# Patient Record
Sex: Male | Born: 1945 | Race: White | Hispanic: No | Marital: Married | Smoking: Former smoker
Health system: Southern US, Community
[De-identification: ages and names within clinical notes are randomized; demographics above are authoritative.]

## PROBLEM LIST (undated history)

## (undated) DIAGNOSIS — K219 Gastro-esophageal reflux disease without esophagitis: Secondary | ICD-10-CM

## (undated) DIAGNOSIS — I10 Essential (primary) hypertension: Secondary | ICD-10-CM

## (undated) DIAGNOSIS — K8012 Calculus of gallbladder with acute and chronic cholecystitis without obstruction: Secondary | ICD-10-CM

## (undated) DIAGNOSIS — E7849 Other hyperlipidemia: Secondary | ICD-10-CM

## (undated) HISTORY — DX: Calculus of gallbladder with acute and chronic cholecystitis without obstruction: K80.12

---

## 2004-02-18 ENCOUNTER — Emergency Department: Payer: Self-pay | Admitting: Emergency Medicine

## 2005-09-13 ENCOUNTER — Emergency Department: Payer: Self-pay | Admitting: Unknown Physician Specialty

## 2006-10-18 ENCOUNTER — Emergency Department: Payer: Self-pay | Admitting: Unknown Physician Specialty

## 2007-04-19 HISTORY — PX: RIGHT COLECTOMY: SHX853

## 2007-06-27 ENCOUNTER — Emergency Department: Payer: Self-pay | Admitting: Otolaryngology

## 2007-06-30 ENCOUNTER — Emergency Department: Payer: Self-pay | Admitting: Emergency Medicine

## 2007-06-30 ENCOUNTER — Other Ambulatory Visit: Payer: Self-pay

## 2008-04-18 HISTORY — PX: CARDIAC CATHETERIZATION: SHX172

## 2008-06-01 ENCOUNTER — Emergency Department: Payer: Self-pay | Admitting: Emergency Medicine

## 2008-07-10 ENCOUNTER — Ambulatory Visit: Payer: Self-pay | Admitting: Cardiology

## 2008-08-14 ENCOUNTER — Emergency Department: Payer: Self-pay | Admitting: Unknown Physician Specialty

## 2008-09-09 ENCOUNTER — Ambulatory Visit: Payer: Self-pay | Admitting: Gastroenterology

## 2009-12-20 ENCOUNTER — Emergency Department: Payer: Self-pay | Admitting: Unknown Physician Specialty

## 2010-06-12 ENCOUNTER — Emergency Department: Payer: Self-pay | Admitting: Unknown Physician Specialty

## 2010-06-15 ENCOUNTER — Emergency Department: Payer: Self-pay | Admitting: Emergency Medicine

## 2011-11-18 ENCOUNTER — Emergency Department: Payer: Self-pay | Admitting: Emergency Medicine

## 2011-11-18 LAB — COMPREHENSIVE METABOLIC PANEL
Albumin: 4.2 g/dL (ref 3.4–5.0)
Anion Gap: 10 (ref 7–16)
BUN: 16 mg/dL (ref 7–18)
Calcium, Total: 9.4 mg/dL (ref 8.5–10.1)
Chloride: 104 mmol/L (ref 98–107)
Co2: 29 mmol/L (ref 21–32)
EGFR (African American): >60
EGFR (Non-African Amer.): >60
Glucose: 138 mg/dL — ABNORMAL HIGH (ref 65–99)
Osmolality: 288 (ref 275–301)
Potassium: 3.9 mmol/L (ref 3.5–5.1)
SGOT(AST): 31 U/L (ref 15–37)
SGPT (ALT): 39 U/L

## 2011-11-18 LAB — CBC
MCH: 32.2 pg (ref 26.0–34.0)
MCHC: 35 g/dL (ref 32.0–36.0)
MCV: 92 fL (ref 80–100)
Platelet: 196 10*3/uL (ref 150–440)
RBC: 4.8 10*6/uL (ref 4.40–5.90)
RDW: 12.9 % (ref 11.5–14.5)

## 2017-10-27 ENCOUNTER — Inpatient Hospital Stay
Admission: EM | Admit: 2017-10-27 | Discharge: 2017-11-16 | DRG: 418 | Disposition: A | Discharge status: Home / Self Care | Payer: Medicare Other | Attending: Internal Medicine | Admitting: Internal Medicine

## 2017-10-27 ENCOUNTER — Emergency Department: Payer: Medicare Other

## 2017-10-27 ENCOUNTER — Encounter: Payer: Self-pay | Admitting: Emergency Medicine

## 2017-10-27 ENCOUNTER — Other Ambulatory Visit: Payer: Self-pay

## 2017-10-27 DIAGNOSIS — Z833 Family history of diabetes mellitus: Secondary | ICD-10-CM

## 2017-10-27 DIAGNOSIS — K219 Gastro-esophageal reflux disease without esophagitis: Secondary | ICD-10-CM | POA: Diagnosis present

## 2017-10-27 DIAGNOSIS — J45909 Unspecified asthma, uncomplicated: Secondary | ICD-10-CM | POA: Diagnosis present

## 2017-10-27 DIAGNOSIS — R338 Other retention of urine: Secondary | ICD-10-CM | POA: Diagnosis not present

## 2017-10-27 DIAGNOSIS — I200 Unstable angina: Secondary | ICD-10-CM | POA: Diagnosis present

## 2017-10-27 DIAGNOSIS — K567 Ileus, unspecified: Secondary | ICD-10-CM | POA: Diagnosis not present

## 2017-10-27 DIAGNOSIS — K812 Acute cholecystitis with chronic cholecystitis: Secondary | ICD-10-CM | POA: Diagnosis present

## 2017-10-27 DIAGNOSIS — Z7982 Long term (current) use of aspirin: Secondary | ICD-10-CM

## 2017-10-27 DIAGNOSIS — I10 Essential (primary) hypertension: Secondary | ICD-10-CM | POA: Diagnosis present

## 2017-10-27 DIAGNOSIS — E7849 Other hyperlipidemia: Secondary | ICD-10-CM | POA: Diagnosis not present

## 2017-10-27 DIAGNOSIS — K821 Hydrops of gallbladder: Secondary | ICD-10-CM | POA: Diagnosis present

## 2017-10-27 DIAGNOSIS — R140 Abdominal distension (gaseous): Secondary | ICD-10-CM

## 2017-10-27 DIAGNOSIS — K660 Peritoneal adhesions (postprocedural) (postinfection): Secondary | ICD-10-CM | POA: Diagnosis present

## 2017-10-27 DIAGNOSIS — R109 Unspecified abdominal pain: Secondary | ICD-10-CM

## 2017-10-27 DIAGNOSIS — K9189 Other postprocedural complications and disorders of digestive system: Secondary | ICD-10-CM | POA: Diagnosis present

## 2017-10-27 DIAGNOSIS — Z8249 Family history of ischemic heart disease and other diseases of the circulatory system: Secondary | ICD-10-CM | POA: Diagnosis not present

## 2017-10-27 DIAGNOSIS — E785 Hyperlipidemia, unspecified: Secondary | ICD-10-CM | POA: Diagnosis present

## 2017-10-27 DIAGNOSIS — K56609 Unspecified intestinal obstruction, unspecified as to partial versus complete obstruction: Secondary | ICD-10-CM

## 2017-10-27 DIAGNOSIS — Z885 Allergy status to narcotic agent status: Secondary | ICD-10-CM

## 2017-10-27 DIAGNOSIS — K8012 Calculus of gallbladder with acute and chronic cholecystitis without obstruction: Secondary | ICD-10-CM

## 2017-10-27 DIAGNOSIS — K8011 Calculus of gallbladder with chronic cholecystitis with obstruction: Secondary | ICD-10-CM | POA: Diagnosis not present

## 2017-10-27 DIAGNOSIS — Z720 Tobacco use: Secondary | ICD-10-CM | POA: Diagnosis not present

## 2017-10-27 DIAGNOSIS — R079 Chest pain, unspecified: Secondary | ICD-10-CM

## 2017-10-27 HISTORY — DX: Gastro-esophageal reflux disease without esophagitis: K21.9

## 2017-10-27 HISTORY — DX: Essential (primary) hypertension: I10

## 2017-10-27 HISTORY — DX: Other hyperlipidemia: E78.49

## 2017-10-27 LAB — BASIC METABOLIC PANEL
Anion gap: 9 (ref 5–15)
BUN: 10 mg/dL (ref 8–23)
CHLORIDE: 99 mmol/L (ref 98–111)
CO2: 24 mmol/L (ref 22–32)
Calcium: 8.9 mg/dL (ref 8.9–10.3)
Creatinine, Ser: 0.81 mg/dL (ref 0.61–1.24)
GFR calc Af Amer: >60 mL/min (ref >60)
GFR calc non Af Amer: >60 mL/min (ref >60)
Glucose, Bld: 159 mg/dL — ABNORMAL HIGH (ref 70–99)
POTASSIUM: 3.6 mmol/L (ref 3.5–5.1)
SODIUM: 132 mmol/L — AB (ref 135–145)

## 2017-10-27 LAB — CBC
HEMATOCRIT: 37.1 % — AB (ref 40.0–52.0)
Hemoglobin: 12.9 g/dL — ABNORMAL LOW (ref 13.0–18.0)
MCH: 32.4 pg (ref 26.0–34.0)
MCHC: 34.7 g/dL (ref 32.0–36.0)
MCV: 93.3 fL (ref 80.0–100.0)
PLATELETS: 184 10*3/uL (ref 150–440)
RBC: 3.97 MIL/uL — ABNORMAL LOW (ref 4.40–5.90)
RDW: 13.8 % (ref 11.5–14.5)
WBC: 9.8 10*3/uL (ref 3.8–10.6)

## 2017-10-27 LAB — HEPATIC FUNCTION PANEL
ALBUMIN: 3.7 g/dL (ref 3.5–5.0)
ALK PHOS: 127 U/L — AB (ref 38–126)
ALT: 41 U/L (ref 0–44)
AST: 36 U/L (ref 15–41)
BILIRUBIN INDIRECT: 0.7 mg/dL (ref 0.3–0.9)
Bilirubin, Direct: 0.3 mg/dL — ABNORMAL HIGH (ref 0.0–0.2)
TOTAL PROTEIN: 7 g/dL (ref 6.5–8.1)
Total Bilirubin: 1 mg/dL (ref 0.3–1.2)

## 2017-10-27 LAB — PROTIME-INR
INR: 1.19
PROTHROMBIN TIME: 15 s (ref 11.4–15.2)

## 2017-10-27 LAB — TROPONIN I

## 2017-10-27 LAB — APTT

## 2017-10-27 LAB — LIPASE, BLOOD: LIPASE: 30 U/L (ref 11–51)

## 2017-10-27 MED ORDER — ACETAMINOPHEN 650 MG RE SUPP: 650.0000 mg | Freq: Four times a day (QID) | RECTAL | Status: DC | PRN

## 2017-10-27 MED ORDER — FLUOXETINE HCL 10 MG PO CAPS
20.0000 mg | ORAL_CAPSULE | Freq: Every day | ORAL | Status: DC
  Administered 2017-10-28 – 2017-11-16 (×14): 20 mg via ORAL
  Filled 2017-10-27 (×14): qty 1
  Filled 2017-10-27: qty 2
  Filled 2017-10-27 (×2): qty 1
  Filled 2017-10-27: qty 2
  Filled 2017-10-27 (×2): qty 1

## 2017-10-27 MED ORDER — PANTOPRAZOLE SODIUM 40 MG PO TBEC
40.0000 mg | DELAYED_RELEASE_TABLET | Freq: Every day | ORAL | Status: DC
  Administered 2017-10-28 – 2017-11-04 (×6): 40 mg via ORAL
  Filled 2017-10-27 (×6): qty 1

## 2017-10-27 MED ORDER — HEPARIN SODIUM (PORCINE) 5000 UNIT/ML IJ SOLN
4000.0000 [IU] | Freq: Once | INTRAMUSCULAR | Status: DC
  Administered 2017-10-27: 4000 [IU] via INTRAVENOUS
  Filled 2017-10-27: qty 1

## 2017-10-27 MED ORDER — DOXAZOSIN MESYLATE 1 MG PO TABS
1.0000 mg | ORAL_TABLET | Freq: Every day | ORAL | Status: DC
  Administered 2017-10-28 – 2017-11-16 (×13): 1 mg via ORAL
  Filled 2017-10-27 (×20): qty 1

## 2017-10-27 MED ORDER — ASPIRIN EC 81 MG PO TBEC
81.0000 mg | DELAYED_RELEASE_TABLET | Freq: Every day | ORAL | Status: DC
  Administered 2017-10-28 – 2017-10-31 (×3): 81 mg via ORAL
  Filled 2017-10-27 (×3): qty 1

## 2017-10-27 MED ORDER — POLYETHYLENE GLYCOL 3350 17 G PO PACK
17.0000 g | PACK | Freq: Every day | ORAL | Status: DC | PRN
  Administered 2017-10-28: 17 g via ORAL
  Filled 2017-10-27: qty 1

## 2017-10-27 MED ORDER — ATORVASTATIN CALCIUM 20 MG PO TABS
40.0000 mg | ORAL_TABLET | Freq: Every day | ORAL | Status: DC
  Administered 2017-10-28 – 2017-11-15 (×13): 40 mg via ORAL
  Filled 2017-10-27 (×14): qty 2

## 2017-10-27 MED ORDER — METOPROLOL TARTRATE 25 MG PO TABS
25.0000 mg | ORAL_TABLET | Freq: Two times a day (BID) | ORAL | Status: DC
  Administered 2017-10-27 – 2017-11-08 (×16): 25 mg via ORAL
  Filled 2017-10-27 (×17): qty 1

## 2017-10-27 MED ORDER — MORPHINE SULFATE (PF) 4 MG/ML IV SOLN
4.0000 mg | Freq: Once | INTRAVENOUS | Status: AC
  Administered 2017-10-27: 4 mg via INTRAVENOUS

## 2017-10-27 MED ORDER — TRAMADOL HCL 50 MG PO TABS
50.0000 mg | ORAL_TABLET | Freq: Four times a day (QID) | ORAL | Status: DC | PRN
  Administered 2017-10-28 – 2017-11-04 (×9): 50 mg via ORAL
  Filled 2017-10-27 (×10): qty 1

## 2017-10-27 MED ORDER — ASPIRIN 81 MG PO CHEW
324.0000 mg | CHEWABLE_TABLET | Freq: Once | ORAL | Status: AC
  Administered 2017-10-27: 324 mg via ORAL

## 2017-10-27 MED ORDER — ALBUTEROL SULFATE (2.5 MG/3ML) 0.083% IN NEBU: 2.5000 mg | INHALATION_SOLUTION | RESPIRATORY_TRACT | Status: DC | PRN

## 2017-10-27 MED ORDER — HEPARIN (PORCINE) IN NACL 100-0.45 UNIT/ML-% IJ SOLN
1000.0000 [IU]/h | INTRAMUSCULAR | Status: DC
  Administered 2017-10-27: 1000 [IU]/h via INTRAVENOUS
  Filled 2017-10-27 (×2): qty 250

## 2017-10-27 MED ORDER — ONDANSETRON HCL 4 MG PO TABS: 4.0000 mg | ORAL_TABLET | Freq: Four times a day (QID) | ORAL | Status: DC | PRN

## 2017-10-27 MED ORDER — CHLORTHALIDONE 25 MG PO TABS
25.0000 mg | ORAL_TABLET | Freq: Two times a day (BID) | ORAL | Status: DC
  Administered 2017-10-28 – 2017-10-29 (×3): 25 mg via ORAL
  Filled 2017-10-27 (×5): qty 1

## 2017-10-27 MED ORDER — ASPIRIN 81 MG PO CHEW
CHEWABLE_TABLET | ORAL | Status: AC
Start: 2017-10-27 — End: 2017-10-28
  Filled 2017-10-27: qty 4

## 2017-10-27 MED ORDER — ONDANSETRON HCL 4 MG/2ML IJ SOLN
4.0000 mg | Freq: Four times a day (QID) | INTRAMUSCULAR | Status: DC | PRN
  Administered 2017-11-03 – 2017-11-16 (×16): 4 mg via INTRAVENOUS
  Filled 2017-10-27 (×16): qty 2

## 2017-10-27 MED ORDER — AMITRIPTYLINE HCL 25 MG PO TABS
50.0000 mg | ORAL_TABLET | Freq: Every day | ORAL | Status: DC
  Administered 2017-10-28 – 2017-11-15 (×13): 50 mg via ORAL
  Filled 2017-10-27 (×14): qty 2

## 2017-10-27 MED ORDER — ACETAMINOPHEN 325 MG PO TABS
650.0000 mg | ORAL_TABLET | Freq: Four times a day (QID) | ORAL | Status: DC | PRN
  Administered 2017-11-03 – 2017-11-16 (×6): 650 mg via ORAL
  Filled 2017-10-27 (×6): qty 2

## 2017-10-27 MED ORDER — LISINOPRIL 20 MG PO TABS
20.0000 mg | ORAL_TABLET | Freq: Every day | ORAL | Status: DC
  Administered 2017-10-28 – 2017-11-16 (×14): 20 mg via ORAL
  Filled 2017-10-27 (×15): qty 1

## 2017-10-27 MED ORDER — HEPARIN BOLUS VIA INFUSION
4000.0000 [IU] | Freq: Once | INTRAVENOUS | Status: DC
  Filled 2017-10-27: qty 4000

## 2017-10-27 MED ORDER — NITROGLYCERIN 2 % TD OINT
1.0000 [in_us] | TOPICAL_OINTMENT | Freq: Once | TRANSDERMAL | Status: AC
  Administered 2017-10-27: 1 [in_us] via TOPICAL
  Filled 2017-10-27: qty 1

## 2017-10-27 MED ORDER — MORPHINE SULFATE (PF) 4 MG/ML IV SOLN
INTRAVENOUS | Status: AC
  Filled 2017-10-27: qty 1

## 2017-10-27 NOTE — Consult Note (Signed)
ANTICOAGULATION CONSULT NOTE - Initial Consult  Pharmacy Consult for Heparin Drip  Indication: chest pain/ACS  Allergies  Allergen Reactions  . Codeine Other (See Comments)   Patient Measurements: Height: 5\' 9"  (175.3 cm) Weight: 186 lb (84.4 kg) IBW/kg (Calculated) : 70.7  Vital Signs: Temp: 99.3 F (37.4 C) (07/12 1649) Temp Source: Oral (07/12 1649) BP: 158/77 (07/12 1830) Pulse Rate: 73 (07/12 1830)  Labs: Recent Labs    10/27/17 1646  HGB 12.9*  HCT 37.1*  PLT 184  CREATININE 0.81  TROPONINI <0.03    Estimated Creatinine Clearance: 84.9 mL/min (by C-G formula based on SCr of 0.81 mg/dL).   Assessment: Pharmacy consulted for heparin drip dosing and monitoring in [age] yo male with ACS/Chest Pain. No anticoagulates reported PTA.   Goal of Therapy:  Heparin level 0.3-0.7 units/ml Monitor platelets by anticoagulation protocol: Yes   Plan:  Baseline labs ordered.  Give 4000 units bolus x 1 Start heparin infusion at 1000 units/hr Check anti-Xa level in 8 hours and daily while on heparin Continue to monitor H&H and platelets  Gardner CandleSheema M Brentton Wardlow, PharmD, BCPS Clinical Pharmacist 10/27/2017 8:20 PM

## 2017-10-27 NOTE — ED Provider Notes (Signed)
[facility] Emergency Department Provider Note  ____________________________________________   First MD Initiated Contact with Patient 10/27/17 1842     (approximate)  I have reviewed the triage vital signs and the nursing notes.   HISTORY  Chief Complaint Chest Pain    HPI Brian Montes is a 72 y.o. male whose medical history includes hypertension and who presents for evaluation of chest pain.  He states he has been having episodic chest pain over the last few months but it has been getting worse.  It now happens at rest as well as with exertion.  This morning it woke him up from sleep and has been persistent throughout the day.  Moving around does seem to make it worse and nothing in particular makes it better.  He takes a daily baby aspirin and he has been taking his antihypertensives recently.  He states that when the chest pain is really bad he is short of breath.  His father died from heart attack but he does not think he has any personal history of heart problems.  He has never had a catheterization and has never seen a cardiologist.  He denies fever/chills, nausea, vomiting, abdominal pain, and dysuria.  He is very concerned about the symptoms because of the way they have been getting worse over time and were very severe this morning.  He reports that the chest pain is currently moderate.  Past Medical History:  Diagnosis Date  . Acid reflux   . Hypertension     There are no active problems to display for this patient.   Past Surgical History:  Procedure Laterality Date  . ABDOMINAL SURGERY      Prior to Admission medications   Medication Sig Start Date End Date Taking? Authorizing Provider  amitriptyline (ELAVIL) 50 MG tablet Take 50 mg by mouth at bedtime.  10/09/17  Yes [provider]  aspirin EC 81 MG tablet Take 81 mg by mouth daily.   Yes [provider]  atorvastatin (LIPITOR) 40 MG tablet Take 40 mg by mouth at  bedtime. 08/15/17  Yes [provider]  chlorthalidone (HYGROTON) 25 MG tablet Take 25 mg by mouth 2 (two) times daily. 10/10/17  Yes [provider]  doxazosin (CARDURA) 1 MG tablet Take 1 mg by mouth daily. 10/25/17  Yes [provider]  FLUoxetine (PROZAC) 20 MG capsule Take 20 mg by mouth daily.   Yes [provider]  lisinopril (PRINIVIL,ZESTRIL) 20 MG tablet Take 20 mg by mouth daily. 09/12/17  Yes [provider]  omeprazole (PRILOSEC) 20 MG capsule Take 20 mg by mouth daily. 10/09/17  Yes [provider]    Allergies Codeine  No family history on file.  Social History Social History   Tobacco Use  . Smoking status: Former Games developer  . Smokeless tobacco: Current User    Types: Chew  Substance Use Topics  . Alcohol use: Not Currently  . Drug use: Never    Review of Systems Constitutional: No fever/chills Eyes: No visual changes. ENT: No sore throat. Cardiovascular: Denies chest pain. Respiratory: Denies shortness of breath. Gastrointestinal: No abdominal pain.  No nausea, no vomiting.  No diarrhea.  No constipation. Genitourinary: Negative for dysuria. Musculoskeletal: Negative for neck pain.  Negative for back pain. Integumentary: Negative for rash. Neurological: Negative for headaches, focal weakness or numbness.   ____________________________________________   PHYSICAL EXAM:  VITAL SIGNS: ED Triage Vitals  Enc Vitals Group     BP 10/27/17 1649 Marland Kitchen)  142/80     Pulse Rate 10/27/17 1649 (!) 116     Resp 10/27/17 1649 20     Temp 10/27/17 1649 99.3 F (37.4 C)     Temp Source 10/27/17 1649 Oral     SpO2 10/27/17 1649 96 %     Weight 10/27/17 1642 84.4 kg (186 lb)     Height 10/27/17 1642 1.753 m (5\' 9" )     Head Circumference --      Peak Flow --      Pain Score 10/27/17 1642 5     Pain Loc --      Pain Edu? --      Excl. in GC? --     Constitutional: Alert and oriented. Well appearing and in no acute  distress but the patient is anxious.. Eyes: Conjunctivae are normal.  Head: Atraumatic. Nose: No congestion/rhinnorhea. Mouth/Throat: Mucous membranes are moist. Neck: No stridor.  No meningeal signs.   Cardiovascular: Normal rate, regular rhythm. Good peripheral circulation. Grossly normal heart sounds.  No reproducible chest wall tenderness to palpation Respiratory: Normal respiratory effort.  No retractions. Lungs CTAB. Gastrointestinal: Soft and nontender. No distention.  Musculoskeletal: No lower extremity tenderness nor edema. No gross deformities of extremities. Neurologic:  Normal speech and language. No gross focal neurologic deficits are appreciated.  Skin:  Skin is warm, dry and intact. No rash noted. Psychiatric: Mood and affect are normal. Speech and behavior are normal.  ____________________________________________   LABS (all labs ordered are listed, but only abnormal results are displayed)  Labs Reviewed  BASIC METABOLIC PANEL - Abnormal; Notable for the following components:      Result Value   Sodium 132 (*)    Glucose, Bld 159 (*)    All other components within normal limits  CBC - Abnormal; Notable for the following components:   RBC 3.97 (*)    Hemoglobin 12.9 (*)    HCT 37.1 (*)    All other components within normal limits  HEPATIC FUNCTION PANEL - Abnormal; Notable for the following components:   Alkaline Phosphatase 127 (*)    Bilirubin, Direct 0.3 (*)    All other components within normal limits  TROPONIN I  LIPASE, BLOOD   ____________________________________________  EKG  ED ECG REPORT I, Loleta Rose, the attending physician, personally viewed and interpreted this ECG.  Date: 10/27/2017 EKG Time: 16: 42 Rate: 119 Rhythm: Sinus tachycardia QRS Axis: normal Intervals: Right bundle branch block ST/T Wave abnormalities: Non-specific ST segment / T-wave changes, but no evidence of acute ischemia. Narrative Interpretation: no evidence of acute  ischemia   ____________________________________________  RADIOLOGY I, Loleta Rose, personally viewed and evaluated these images (plain radiographs) as part of my medical decision making, as well as reviewing the written report by the radiologist.  ED MD interpretation:  No acute abnormalities on chest x-ray  Official radiology report(s): Dg Chest 2 View  Result Date: 10/27/2017 CLINICAL DATA:  Patient with complains of sharp chest pain x 2 days w/ palpitations w/ associated shortness of breath and generalized weakness. Pain also in abdomen. Hx of acid reflux, abdominal surgery, and hypertension. EXAM: CHEST - 2 VIEW COMPARISON:  06/30/2007 FINDINGS: Cardiac silhouette is mildly enlarged. No mediastinal or hilar masses. No evidence of adenopathy. Clear lungs.  No pleural effusion or pneumothorax. Skeletal structures are intact. IMPRESSION: No active cardiopulmonary disease. Electronically Signed   By: Amie Portland M.D.   On: 10/27/2017 17:30    ____________________________________________   PROCEDURES  Critical Care performed:  No   Procedure(s) performed:   .Critical Care Performed by: Loleta Rose, MD Authorized by: Loleta Rose, MD   Critical care provider statement:    Critical care time (minutes):  30   Critical care time was exclusive of:  Separately billable procedures and treating other patients   Critical care was necessary to treat or prevent imminent or life-threatening deterioration of the following conditions:  Cardiac failure (unstable angina)   Critical care was time spent personally by me on the following activities:  Development of treatment plan with patient or surrogate, discussions with consultants, evaluation of patient's response to treatment, examination of patient, obtaining history from patient or surrogate, ordering and performing treatments and interventions, ordering and review of laboratory studies, ordering and review of radiographic studies, pulse  oximetry, re-evaluation of patient's condition and review of old charts     ____________________________________________   INITIAL IMPRESSION / ASSESSMENT AND PLAN / ED COURSE  As part of my medical decision making, I reviewed the following data within the electronic MEDICAL RECORD NUMBER Nursing notes reviewed and incorporated, Labs reviewed , EKG interpreted , Old EKG reviewed, Old chart reviewed, Radiograph reviewed , Discussed with admitting physician  and Notes from prior ED visits    Differential diagnosis includes, but is not limited to, ACS, pneumonia, PE, musculoskeletal pain, aortic dissection.  The patient has a moderate risk of ACS based on his HEART score and he has never had a cardiac evaluation based on his history and according to the medical records.  His old EKG also demonstrates right bundle branch block and he has no evidence of acute ischemia at this time, but his history of episodic chest pain that has been gradually getting worse over time and now woke him from sleep and is persistent is concerning.  He has limited outpatient resources and no cardiologist, it is Friday evening, and he lives alone.  I think he would benefit from inpatient cardiac evaluation, and possibly including a stress test or even catheterization given his risk factors and ongoing chest pain.  I am waiting to discuss the case with the hospitalist.  I have given him a full dose aspirin and morphine 4 mg IV.  He still had some persistent pain so at that point I put nitroglycerin 1 inch of ointment on his chest and that did seem to help.  He prefers to stay in the hospital if possible and I think that is appropriate.  I will discuss treating as unstable angina with heparin and/or Brilinta when I discussed with hospitalist, but I do not want to start an expensive treatment that they are going to discontinue at the time of admission.  Clinical Course as of Oct 27 2013  Fri Oct 27, 2017  1850 No evidence of acute  abnormality on CXR  DG Chest 2 View [CF]  2015 Spoke with phone with Dr. Elpidio Anis who agreed with my plan for unstable angina treatment and agreed with the plan for heparin bolus and infusion.  I have ordered the medication and the hospitalist will order the cardiology consult.  The hope is that the patient will be able to receive a catheterization during this hospitalization.  Patient currently is hemodynamically stable.   [CF]    Clinical Course User Index [CF] Loleta Rose, MD    ____________________________________________  FINAL CLINICAL IMPRESSION(S) / ED DIAGNOSES  Final diagnoses:  Chest pain, unspecified type  Unstable angina (HCC)     MEDICATIONS GIVEN DURING THIS VISIT:  Medications  heparin  injection 4,000 Units (has no administration in time range)  metoprolol tartrate (LOPRESSOR) tablet 25 mg (has no administration in time range)  aspirin chewable tablet 324 mg (324 mg Oral Given 10/27/17 1851)  morphine 4 MG/ML injection 4 mg (4 mg Intravenous Given 10/27/17 1858)  nitroGLYCERIN (NITROGLYN) 2 % ointment 1 inch (1 inch Topical Given 10/27/17 1921)     ED Discharge Orders    None       Note:  This document was prepared using Dragon voice recognition software and may include unintentional dictation errors.    Loleta RoseForbach, Mehek Grega, MD 10/27/17 2016

## 2017-10-27 NOTE — ED Notes (Signed)
admitting MD at bedside

## 2017-10-27 NOTE — Progress Notes (Signed)
Advance care planning  Discussed with patient regarding his acute hospitalization, unstable angina and CODE STATUS  Patient is alert and oriented.  Able to make decisions  HCPOA - Jeannetta Ellisnthony Kloth - Son  Explained to patient regarding unstable angina, treatment plan and prognosis.  Answered all questions. Discussed with patient regarding full code versus DNR status.  Initially patient mentioned he did not want any resuscitation.  After explaining in detail and asking more questions he has made the decision to be a full code but does not want to be on prolonged life support.  Full code  Time spent - 17 minutes

## 2017-10-27 NOTE — ED Notes (Signed)
Spoke with MD Anne HahnWillis, patient's heparin to be paused pending repeat APPT levels. Blood drawn of of free IV line. 10 mL of waste drawn from line prior to obtaining sample. Floor RN Maricar notified.

## 2017-10-27 NOTE — H&P (Signed)
[facility] [place] [facility]   PATIENT NAME: Brian Montes    MR#:  [id number]  DATE OF BIRTH:  May 27, 1945  DATE OF ADMISSION:  10/27/2017  PRIMARY CARE PHYSICIAN: Center, [facility]   REQUESTING/REFERRING PHYSICIAN: Dr. York Cerise  CHIEF COMPLAINT:   Chief Complaint  Patient presents with  . Chest Pain    HISTORY OF PRESENT ILLNESS:  Brian Montes  is a 72 y.o. male with a known history of HTN, HL, Past tobacco use presents to the emergency room due to left-sided chest pain that has been ongoing intermittently for 1 year and has progressively worsened.  At this time pain happens at rest and with exertion.  Associated shortness of breath and sweating.  Patient's troponin is normal and EKG shows no acute ST changes. Patient is being admitted to the hospital due to unstable angina. No stress test or cardiac catheterization in the past.  PAST MEDICAL HISTORY:   Past Medical History:  Diagnosis Date  . Acid reflux   . Hypertension     PAST SURGICAL HISTORY:   Past Surgical History:  Procedure Laterality Date  . ABDOMINAL SURGERY      SOCIAL HISTORY:   Social History   Tobacco Use  . Smoking status: Former Games developer  . Smokeless tobacco: Current User    Types: Chew  Substance Use Topics  . Alcohol use: Not Currently    FAMILY HISTORY:  No family history on file.  DRUG ALLERGIES:   Allergies  Allergen Reactions  . Codeine Other (See Comments)    REVIEW OF SYSTEMS:   Review of Systems  Constitutional: Positive for malaise/fatigue. Negative for chills and fever.  HENT: Negative for sore throat.   Eyes: Negative for blurred vision, double vision and pain.  Respiratory: Positive for shortness of breath. Negative for cough, hemoptysis and wheezing.   Cardiovascular: Positive for chest pain. Negative for palpitations, orthopnea and leg swelling.  Gastrointestinal: Negative for abdominal pain, constipation, diarrhea, heartburn, nausea  and vomiting.  Genitourinary: Negative for dysuria and hematuria.  Musculoskeletal: Negative for back pain and joint pain.  Skin: Negative for rash.  Neurological: Negative for sensory change, speech change, focal weakness and headaches.  Endo/Heme/Allergies: Does not bruise/bleed easily.  Psychiatric/Behavioral: Negative for depression. The patient is not nervous/anxious.     MEDICATIONS AT HOME:   Prior to Admission medications   Medication Sig Start Date End Date Taking? Authorizing Provider  amitriptyline (ELAVIL) 50 MG tablet Take 50 mg by mouth at bedtime.  10/09/17  Yes [provider]  aspirin EC 81 MG tablet Take 81 mg by mouth daily.   Yes [provider]  atorvastatin (LIPITOR) 40 MG tablet Take 40 mg by mouth at bedtime. 08/15/17  Yes [provider]  chlorthalidone (HYGROTON) 25 MG tablet Take 25 mg by mouth 2 (two) times daily. 10/10/17  Yes [provider]  doxazosin (CARDURA) 1 MG tablet Take 1 mg by mouth daily. 10/25/17  Yes [provider]  FLUoxetine (PROZAC) 20 MG capsule Take 20 mg by mouth daily.   Yes [provider]  lisinopril (PRINIVIL,ZESTRIL) 20 MG tablet Take 20 mg by mouth daily. 09/12/17  Yes [provider]  omeprazole (PRILOSEC) 20 MG capsule Take 20 mg by mouth daily. 10/09/17  Yes [provider]     VITAL SIGNS:  Blood pressure (!) 158/77, pulse 73, temperature 99.3 F (37.4 C), temperature source Oral, resp. rate 20, height 5\' 9"  (1.753 m), weight 84.4 kg (  186 lb), SpO2 100 %.  PHYSICAL EXAMINATION:  Physical Exam  GENERAL:  72 y.o.-year-old patient lying in the bed with no acute distress.  EYES: Pupils equal, round, reactive to light and accommodation. No scleral icterus. Extraocular muscles intact.  HEENT: Head atraumatic, normocephalic. Oropharynx and nasopharynx clear. No oropharyngeal erythema, moist oral mucosa  NECK:  Supple, no jugular venous distention. No thyroid  enlargement, no tenderness.  LUNGS: Normal breath sounds bilaterally, no wheezing, rales, rhonchi. No use of accessory muscles of respiration.  CARDIOVASCULAR: S1, S2 normal. No murmurs, rubs, or gallops.  ABDOMEN: Soft, nontender, nondistended. Bowel sounds present. No organomegaly or mass.  EXTREMITIES: No pedal edema, cyanosis, or clubbing. + 2 pedal & radial pulses b/l.   NEUROLOGIC: Cranial nerves II through XII are intact. No focal Motor or sensory deficits appreciated b/l PSYCHIATRIC: The patient is alert and oriented x 3. Good affect.  SKIN: No obvious rash, lesion, or ulcer.   LABORATORY PANEL:   CBC Recent Labs  Lab 10/27/17 1646  WBC 9.8  HGB 12.9*  HCT 37.1*  PLT 184   ------------------------------------------------------------------------------------------------------------------  Chemistries  Recent Labs  Lab 10/27/17 1646  NA 132*  K 3.6  CL 99  CO2 24  GLUCOSE 159*  BUN 10  CREATININE 0.81  CALCIUM 8.9  AST 36  ALT 41  ALKPHOS 127*  BILITOT 1.0   ------------------------------------------------------------------------------------------------------------------  Cardiac Enzymes Recent Labs  Lab 10/27/17 1646  TROPONINI <0.03   ------------------------------------------------------------------------------------------------------------------  RADIOLOGY:  Dg Chest 2 View  Result Date: 10/27/2017 CLINICAL DATA:  Patient with complains of sharp chest pain x 2 days w/ palpitations w/ associated shortness of breath and generalized weakness. Pain also in abdomen. Hx of acid reflux, abdominal surgery, and hypertension. EXAM: CHEST - 2 VIEW COMPARISON:  06/30/2007 FINDINGS: Cardiac silhouette is mildly enlarged. No mediastinal or hilar masses. No evidence of adenopathy. Clear lungs.  No pleural effusion or pneumothorax. Skeletal structures are intact. IMPRESSION: No active cardiopulmonary disease. Electronically Signed   By: Amie Portlandavid  Ormond M.D.   On: 10/27/2017  17:30     IMPRESSION AND PLAN:   * Unstable angina Admit to telemetry floor.  Aspirin, statin.  Start heparin drip.  We will add metoprolol to his blood pressure medications.  Check echocardiogram.  Consult cardiology.  Repeat troponin. Repeat EKG if any further chest pain.  Nitropaste placed in the emergency room.  *Hypertension.  Continue lisinopril and chlorthalidone patient is on.  Added metoprolol.  *Hyperlipidemia.  On Lipitor.  *DVT prophylaxis.  On heparin drip.  All the records are reviewed and case discussed with ED provider. Management plans discussed with the patient, family and they are in agreement.  CODE STATUS: FULL CODE  TOTAL TIME TAKING CARE OF THIS PATIENT: 40 minutes.   Orie FishermanSrikar R Marlan Steward M.D on 10/27/2017 at 8:18 PM  Between 7am to 6pm - Pager - [phone]  After 6pm go to [email or web address] - password [email or web address]  [facility]  Office  [phone]  CC: Primary care physician; [facility]  Note: This dictation was prepared with Dragon dictation along with smaller phrase technology. Any transcriptional errors that result from this process are unintentional.

## 2017-10-27 NOTE — ED Notes (Signed)
Attempted to call report to floor. Primary RN unavailable at this time. Was informed RN would call back as soon as able

## 2017-10-27 NOTE — ED Triage Notes (Signed)
Pt in via POV with complaints of sharp chest pain x 2 days w/ palpitations w/ associated shortness of breath and generalized weakness.  Pt ambulatory to triage, NAD noted at this time.

## 2017-10-27 NOTE — Progress Notes (Addendum)
CCMD called and reported a critical value of APTT greater 160. Page prime. Will continue to monitor.  Update 2202: Doctor Anne HahnWillis ordered to restart heparin gtt at 10 ml/hr. Will continue to monitor.   Update 2229: Doctor Anne HahnWillis called but no new order was place,  states to call pharmacy. Did talked to pharmacy Barbara Cower( [place]) and states to just run heparin gtt at 10 ml/ hr at this time and they will monitor APTT. Will continue to monitor.

## 2017-10-27 NOTE — ED Notes (Signed)
Pt recently started taking new blood pressure medicine X2 days ago. Since he reports having the chest pain, sob, and dizziness

## 2017-10-28 ENCOUNTER — Inpatient Hospital Stay
Admit: 2017-10-28 | Discharge: 2017-10-28 | Disposition: A | Discharge status: Home / Self Care | Payer: Medicare Other | Attending: Internal Medicine | Admitting: Internal Medicine

## 2017-10-28 ENCOUNTER — Encounter: Payer: Self-pay | Admitting: Cardiology

## 2017-10-28 DIAGNOSIS — E785 Hyperlipidemia, unspecified: Secondary | ICD-10-CM | POA: Diagnosis present

## 2017-10-28 DIAGNOSIS — I200 Unstable angina: Secondary | ICD-10-CM

## 2017-10-28 DIAGNOSIS — E7849 Other hyperlipidemia: Secondary | ICD-10-CM

## 2017-10-28 DIAGNOSIS — I10 Essential (primary) hypertension: Secondary | ICD-10-CM

## 2017-10-28 LAB — BASIC METABOLIC PANEL
ANION GAP: 9 (ref 5–15)
BUN: 11 mg/dL (ref 8–23)
CALCIUM: 8.8 mg/dL — AB (ref 8.9–10.3)
CO2: 25 mmol/L (ref 22–32)
Chloride: 101 mmol/L (ref 98–111)
Creatinine, Ser: 0.77 mg/dL (ref 0.61–1.24)
GFR calc non Af Amer: >60 mL/min (ref >60)
GLUCOSE: 111 mg/dL — AB (ref 70–99)
Potassium: 4.1 mmol/L (ref 3.5–5.1)
SODIUM: 135 mmol/L (ref 135–145)

## 2017-10-28 LAB — CBC
HCT: 35 % — ABNORMAL LOW (ref 40.0–52.0)
Hemoglobin: 12.3 g/dL — ABNORMAL LOW (ref 13.0–18.0)
MCH: 33.1 pg (ref 26.0–34.0)
MCHC: 35.2 g/dL (ref 32.0–36.0)
MCV: 94 fL (ref 80.0–100.0)
PLATELETS: 160 10*3/uL (ref 150–440)
RBC: 3.72 MIL/uL — AB (ref 4.40–5.90)
RDW: 13.6 % (ref 11.5–14.5)
WBC: 10.1 10*3/uL (ref 3.8–10.6)

## 2017-10-28 LAB — HEPARIN LEVEL (UNFRACTIONATED)
HEPARIN UNFRACTIONATED: 0.37 [IU]/mL (ref 0.30–0.70)
Heparin Unfractionated: 0.32 IU/mL (ref 0.30–0.70)

## 2017-10-28 LAB — TROPONIN I: Troponin I: <0.03 ng/mL (ref <0.03)

## 2017-10-28 LAB — ECHOCARDIOGRAM COMPLETE
Height: 69 in
Weight: 2905.6 oz

## 2017-10-28 MED ORDER — DIPHENHYDRAMINE HCL 25 MG PO CAPS
25.0000 mg | ORAL_CAPSULE | Freq: Every evening | ORAL | Status: DC | PRN
  Administered 2017-10-28 – 2017-11-14 (×5): 25 mg via ORAL
  Filled 2017-10-28 (×5): qty 1

## 2017-10-28 MED ORDER — BISACODYL 5 MG PO TBEC
5.0000 mg | DELAYED_RELEASE_TABLET | Freq: Every day | ORAL | Status: DC | PRN
  Administered 2017-10-29: 5 mg via ORAL
  Filled 2017-10-28: qty 1

## 2017-10-28 MED ORDER — TAMSULOSIN HCL 0.4 MG PO CAPS
0.4000 mg | ORAL_CAPSULE | Freq: Every day | ORAL | Status: DC
  Administered 2017-10-28 – 2017-10-29 (×2): 0.4 mg via ORAL
  Filled 2017-10-28 (×2): qty 1

## 2017-10-28 MED ORDER — PERFLUTREN LIPID MICROSPHERE
1.0000 mL | INTRAVENOUS | Status: AC | PRN
  Administered 2017-10-28: 5 mL via INTRAVENOUS
  Filled 2017-10-28: qty 10

## 2017-10-28 MED ORDER — DOCUSATE SODIUM 100 MG PO CAPS
100.0000 mg | ORAL_CAPSULE | Freq: Two times a day (BID) | ORAL | Status: DC
  Administered 2017-10-28 – 2017-11-15 (×22): 100 mg via ORAL
  Filled 2017-10-28 (×27): qty 1

## 2017-10-28 MED ORDER — ALUM & MAG HYDROXIDE-SIMETH 200-200-20 MG/5ML PO SUSP
30.0000 mL | Freq: Four times a day (QID) | ORAL | Status: DC | PRN
  Administered 2017-11-03 – 2017-11-04 (×3): 30 mL via ORAL
  Filled 2017-10-28 (×3): qty 30

## 2017-10-28 NOTE — Progress Notes (Signed)
ANTICOAGULATION CONSULT NOTE - Initial Consult  Pharmacy Consult for Heparin  Indication: chest pain/ACS  Allergies  Allergen Reactions  . Codeine Other (See Comments)    Patient Measurements: Height: 5\' 9"  (175.3 cm) Weight: 181 lb 9.6 oz (82.4 kg) IBW/kg (Calculated) : 70.7 Heparin Dosing Weight:   Vital Signs: Temp: 99.5 F (37.5 C) (07/13 0316) Temp Source: Oral (07/13 0316) BP: 136/74 (07/13 0316) Pulse Rate: 66 (07/13 0316)  Labs: Recent Labs    10/27/17 1646 10/27/17 2032 10/27/17 2119 10/27/17 2231 10/28/17 0422  HGB 12.9*  --   --   --  12.3*  HCT 37.1*  --   --   --  35.0*  PLT 184  --   --   --  160  APTT  --  >160* >160*  --   --   LABPROT  --  15.0  --   --   --   INR  --  1.19  --   --   --   HEPARINUNFRC  --   --   --   --  0.32  CREATININE 0.81  --   --   --  0.77  TROPONINI <0.03  --   --  <0.03  --     Estimated Creatinine Clearance: 85.9 mL/min (by C-G formula based on SCr of 0.77 mg/dL).   Medical History: Past Medical History:  Diagnosis Date  . Acid reflux   . Hypertension     Medications:  Medications Prior to Admission  Medication Sig Dispense Refill Last Dose  . amitriptyline (ELAVIL) 50 MG tablet Take 50 mg by mouth at bedtime.    10/26/2017 at 2100  . aspirin EC 81 MG tablet Take 81 mg by mouth daily.   10/27/2017 at 0800  . atorvastatin (LIPITOR) 40 MG tablet Take 40 mg by mouth at bedtime.   10/26/2017 at 2100  . chlorthalidone (HYGROTON) 25 MG tablet Take 25 mg by mouth 2 (two) times daily.   10/26/2017 at 1800  . doxazosin (CARDURA) 1 MG tablet Take 1 mg by mouth daily.   10/27/2017 at 0800  . FLUoxetine (PROZAC) 20 MG capsule Take 20 mg by mouth daily.   10/27/2017 at 0800  . lisinopril (PRINIVIL,ZESTRIL) 20 MG tablet Take 20 mg by mouth daily.   10/27/2017 at 0800  . omeprazole (PRILOSEC) 20 MG capsule Take 20 mg by mouth daily.   10/27/2017 at 0800    Assessment: Pharmacy consulted for heparin drip dosing and monitoring in 72  yo male with ACS/Chest Pain. No anticoagulates reported PTA.    Goal of Therapy:  Heparin level 0.3-0.7 units/ml Monitor platelets by anticoagulation protocol: Yes   Plan:  Baseline labs ordered.  Give 4000 units bolus x 1 Start heparin infusion at 1000 units/hr Check anti-Xa level in 8 hours and daily while on heparin Continue to monitor H&H and platelets  7/13:  HL @ 0430 = 0.32 Will continue this pt on current rate and draw confirmation level on 7/13 @ 10:30.   Lilias Lorensen D 10/28/2017,5:27 AM

## 2017-10-28 NOTE — Plan of Care (Signed)
  Problem: Education: Goal: Knowledge of General Education information will improve Outcome: Progressing   Problem: Health Behavior/Discharge Planning: Goal: Ability to manage health-related needs will improve Outcome: Progressing   Problem: Pain Managment: Goal: General experience of comfort will improve Outcome: Progressing   Problem: Safety: Goal: Ability to remain free from injury will improve Outcome: Progressing   

## 2017-10-28 NOTE — Progress Notes (Signed)
Pt was admitted on the floor with no sign of distress. VSS. Will continue to monitor.

## 2017-10-28 NOTE — Progress Notes (Signed)
ANTICOAGULATION CONSULT NOTE  Pharmacy Consult for Heparin  Indication: chest pain/ACS  Allergies  Allergen Reactions  . Codeine Other (See Comments)    Patient Measurements: Height: 5\' 9"  (175.3 cm) Weight: 181 lb 9.6 oz (82.4 kg) IBW/kg (Calculated) : 70.7 Heparin Dosing Weight:   Vital Signs: Temp: 99.2 F (37.3 C) (07/13 0803) Temp Source: Oral (07/13 0803) BP: 142/88 (07/13 0803) Pulse Rate: 64 (07/13 0803)  Labs: Recent Labs    10/27/17 1646 10/27/17 2032 10/27/17 2119 10/27/17 2231 10/28/17 0422 10/28/17 1044  HGB 12.9*  --   --   --  12.3*  --   HCT 37.1*  --   --   --  35.0*  --   PLT 184  --   --   --  160  --   APTT  --  >160* >160*  --   --   --   LABPROT  --  15.0  --   --   --   --   INR  --  1.19  --   --   --   --   HEPARINUNFRC  --   --   --   --  0.32 0.37  CREATININE 0.81  --   --   --  0.77  --   TROPONINI <0.03  --   --  <0.03 <0.03  --     Estimated Creatinine Clearance: 85.9 mL/min (by C-G formula based on SCr of 0.77 mg/dL).   Medical History: Past Medical History:  Diagnosis Date  . Acid reflux   . Hypertension     Medications:  Medications Prior to Admission  Medication Sig Dispense Refill Last Dose  . amitriptyline (ELAVIL) 50 MG tablet Take 50 mg by mouth at bedtime.    10/26/2017 at 2100  . aspirin EC 81 MG tablet Take 81 mg by mouth daily.   10/27/2017 at 0800  . atorvastatin (LIPITOR) 40 MG tablet Take 40 mg by mouth at bedtime.   10/26/2017 at 2100  . chlorthalidone (HYGROTON) 25 MG tablet Take 25 mg by mouth 2 (two) times daily.   10/26/2017 at 1800  . doxazosin (CARDURA) 1 MG tablet Take 1 mg by mouth daily.   10/27/2017 at 0800  . FLUoxetine (PROZAC) 20 MG capsule Take 20 mg by mouth daily.   10/27/2017 at 0800  . lisinopril (PRINIVIL,ZESTRIL) 20 MG tablet Take 20 mg by mouth daily.   10/27/2017 at 0800  . omeprazole (PRILOSEC) 20 MG capsule Take 20 mg by mouth daily.   10/27/2017 at 0800    Assessment: Pharmacy consulted  for heparin drip dosing and monitoring in 72 yo male with ACS/Chest Pain. No anticoagulates reported PTA.    Goal of Therapy:  Heparin level 0.3-0.7 units/ml Monitor platelets by anticoagulation protocol: Yes   Plan:  Continue heparin infusion at current rate and recheck labs in AM.   Luisa Harthristy, Loveta Dellis D 10/28/2017,11:42 AM

## 2017-10-28 NOTE — Progress Notes (Addendum)
Pt complaining of not being able to urinate despite encouragement to pee. Bladder scan showed 760 ml. Notify prime. Will continue to monitor.  Doctor Sheryle Hailiamond give verbal order to in and out cath once and ordered Flomax 0.4mg  daily at bedtime.  Update 0301: Pt urine output from in and out cath was at 650. Will continue to monitor.

## 2017-10-28 NOTE — Consult Note (Addendum)
Cardiology Consultation:   Patient ID: Brian Montes; [id number]; 20-May-1945   Admit date: 10/27/2017 Date of Consult: 10/28/2017  Primary Care Provider: [facility] Primary Cardiologist: No primary care provider on file.  Seen by Dr. Lady Gary in 2010 - no follow-up. Primary Electrophysiologist:  n/a   Patient Profile:   NEWEL OIEN is a 72 y.o. male with a hx of hypertension, hyperlipidemia and former smoker (with normal coronary arteries and catheterization 2010) who is being seen today for the evaluation of PROLONGED CHEST PAIN/UNSTABLE ANGINA at the request of Dr. Imogene Burn & Dr. Elpidio Anis.  History of Present Illness:   Mr. Markovic is a 72 year old gentleman who is relatively poor historian.  He has noted intermittent episodes of chest discomfort for about a year now, but states that yesterday morning (10/27/2017) he woke up with severe "heart pain "that lasted maybe a couple hours.  Then somewhat subsided but it came again off and on throughout the day.  As far as I can tell he said that there was associated dyspnea but he was very scared with the first episode.  He said it did get worse with walking and he had a really hard time sleeping last night due to discomfort.  He did tell me that the nitroglycerin he was given in the emergency room helped his pain and being on the nitroglycerin paste is helped.  He has had some ongoing dizzy spells for the last month or 2, it sounds somewhat vertiginous in nature but also could be related to some orthostatic symptoms.  He also has noted may be some mild orthopnea but no real PND or edema.  He says he feels his heart racing it off and on every now and then but nothing prolonged.  He got scared with his chest pain yesterday he noted his heart rate went up, but has subsided now.  He has a lot of dizziness with may be some mild near syncope but no syncope.  No TIA or amaurosis fugax.  No claudication.  He states that he is very unsteady with  walking.  Past Medical History:  Diagnosis Date  . Acid reflux   . Essential hypertension   . Hyperlipidemia due to dietary fat intake     Past Surgical History:  Procedure Laterality Date  . ABDOMINAL SURGERY    . CARDIAC CATHETERIZATION  2010   Dr. Lady Gary - NORMAL CORONARIES     Home Medications:  Prior to Admission medications   Medication Sig Start Date End Date Taking? Authorizing Provider  amitriptyline (ELAVIL) 50 MG tablet Take 50 mg by mouth at bedtime.  10/09/17  Yes [provider]  aspirin EC 81 MG tablet Take 81 mg by mouth daily.   Yes [provider]  atorvastatin (LIPITOR) 40 MG tablet Take 40 mg by mouth at bedtime. 08/15/17  Yes [provider]  chlorthalidone (HYGROTON) 25 MG tablet Take 25 mg by mouth 2 (two) times daily. 10/10/17  Yes [provider]  doxazosin (CARDURA) 1 MG tablet Take 1 mg by mouth daily. 10/25/17  Yes [provider]  FLUoxetine (PROZAC) 20 MG capsule Take 20 mg by mouth daily.   Yes [provider]  lisinopril (PRINIVIL,ZESTRIL) 20 MG tablet Take 20 mg by mouth daily. 09/12/17  Yes [provider]  omeprazole (PRILOSEC) 20 MG capsule Take 20 mg by mouth daily. 10/09/17  Yes [provider]    Inpatient Medications: Scheduled Meds: . amitriptyline  50 mg Oral QHS  .  aspirin EC  81 mg Oral Daily  . atorvastatin  40 mg Oral QHS  . chlorthalidone  25 mg Oral BID  . docusate sodium  100 mg Oral BID  . doxazosin  1 mg Oral Daily  . FLUoxetine  20 mg Oral Daily  . heparin  4,000 Units Intravenous Once  . lisinopril  20 mg Oral Daily  . metoprolol tartrate  25 mg Oral BID  . pantoprazole  40 mg Oral Daily  . tamsulosin  0.4 mg Oral QHS   Continuous Infusions: . heparin 1,000 Units/hr (10/27/17 2202)   PRN Meds: acetaminophen **OR** acetaminophen, albuterol, alum & mag hydroxide-simeth, bisacodyl, ondansetron **OR** ondansetron (ZOFRAN) IV, polyethylene glycol,  traMADol  Allergies:    Allergies  Allergen Reactions  . Codeine Other (See Comments)    Social History:   Social History   Socioeconomic History  . Marital status: Married    Spouse name: Not on file  . Number of children: Not on file  . Years of education: Not on file  . Highest education level: Not on file  Occupational History  . Not on file  Social Needs  . Financial resource strain: Not on file  . Food insecurity:    Worry: Not on file    Inability: Not on file  . Transportation needs:    Medical: Not on file    Non-medical: Not on file  Tobacco Use  . Smoking status: Former Games developer  . Smokeless tobacco: Current User    Types: Chew  Substance and Sexual Activity  . Alcohol use: Not Currently  . Drug use: Never  . Sexual activity: Not on file  Lifestyle  . Physical activity:    Days per week: Not on file    Minutes per session: Not on file  . Stress: Not on file  Relationships  . Social connections:    Talks on phone: Not on file    Gets together: Not on file    Attends religious service: Not on file    Active member of club or organization: Not on file    Attends meetings of clubs or organizations: Not on file    Relationship status: Not on file  . Intimate partner violence:    Fear of current or ex partner: Not on file    Emotionally abused: Not on file    Physically abused: Not on file    Forced sexual activity: Not on file  Other Topics Concern  . Not on file  Social History Narrative  . Not on file    Family History: He is not really that much aware of his family history.  No known premature cardiac disease. Family History  Problem Relation Age of Onset  . Diabetes Brother      ROS:  Please see the history of present illness.  Review of Systems  Constitutional: Negative for malaise/fatigue and weight loss.  HENT: Negative for congestion and nosebleeds.   Respiratory: Negative for cough, sputum production, shortness of breath and wheezing.    Cardiovascular: Positive for chest pain, palpitations and orthopnea.  Gastrointestinal: Positive for abdominal pain, constipation and heartburn. Negative for blood in stool and melena.       He notes that he usually has bowel movement every day to every other day, but has not had a bowel movement in the last 2 days  Genitourinary:       He states that he intermittently has difficulty voiding. -Had a Foley catheter placed in the  ER which relieved his bladder  Musculoskeletal: Negative.   Neurological: Positive for dizziness (Ongoing for couple months now) and weakness (Seems like both legs are weak). Negative for focal weakness.  Psychiatric/Behavioral: The patient is nervous/anxious.   All other systems reviewed and are negative.    All other ROS reviewed and negative.     Physical Exam/Data:   Vitals:   10/27/17 2139 10/28/17 0308 10/28/17 0316 10/28/17 0803  BP: (!) 156/79  136/74 (!) 142/88  Pulse: 100  66 64  Resp: 18  16   Temp: 99 F (37.2 C)  99.5 F (37.5 C) 99.2 F (37.3 C)  TempSrc: Oral  Oral Oral  SpO2: 100%  99% 99%  Weight: 186 lb 3.2 oz (84.5 kg) 181 lb 9.6 oz (82.4 kg)    Height: 5\' 9"  (1.753 m)       Intake/Output Summary (Last 24 hours) at 10/28/2017 1158 Last data filed at 10/28/2017 0330 Gross per 24 hour  Intake 54.67 ml  Output 650 ml  Net -595.33 ml   Filed Weights   10/27/17 1642 10/27/17 2139 10/28/17 0308  Weight: 186 lb (84.4 kg) 186 lb 3.2 oz (84.5 kg) 181 lb 9.6 oz (82.4 kg)   Body mass index is 26.82 kg/m.  General:  Well nourished, well developed, in no acute distress.  Somewhat disheveled appearing HEENT: normal; poor dentition. [place]/AT Lymph: no adenopathy Neck: no JVD Endocrine:  No thryomegaly Cardiac:  normal S1, S2; RRR; no m/r/g.  Nondisplaced PMI. Lungs:  clear to auscultation bilaterally, no wheezing, rhonchi or rales  Abd: soft, nontender, no hepatomegaly  Ext: no edema Musculoskeletal:  No deformities, BUE and BLE strength  normal and equal Skin: warm and dry  Neuro:  CNs 2-12 intact, no focal abnormalities noted Psych:  Normal affect   EKG:  The EKG was personally reviewed and demonstrates: Sinus tachycardia, rate 119 bpm.  RBBB.  (Computer reads septal infarct, age undetermined, not accurate).  No change from August 2013  Telemetry:  Telemetry was personally reviewed and demonstrates: Normal sinus rhythm.  Rate currently in the 60s.  Relevant CV Studies:  Cardiac cath films from 2010 reviewed.  No CAD.  (Report not available)    Laboratory Data:  Chemistry Recent Labs  Lab 10/27/17 1646 10/28/17 0422  NA 132* 135  K 3.6 4.1  CL 99 101  CO2 24 25  GLUCOSE 159* 111*  BUN 10 11  CREATININE 0.81 0.77  CALCIUM 8.9 8.8*  GFRNONAA >60 >60  GFRAA >60 >60  ANIONGAP 9 9    Recent Labs  Lab 10/27/17 1646  PROT 7.0  ALBUMIN 3.7  AST 36  ALT 41  ALKPHOS 127*  BILITOT 1.0   Hematology Recent Labs  Lab 10/27/17 1646 10/28/17 0422  WBC 9.8 10.1  RBC 3.97* 3.72*  HGB 12.9* 12.3*  HCT 37.1* 35.0*  MCV 93.3 94.0  MCH 32.4 33.1  MCHC 34.7 35.2  RDW 13.8 13.6  PLT 184 160   Cardiac Enzymes Recent Labs  Lab 10/27/17 1646 10/27/17 2231 10/28/17 0422  TROPONINI <0.03 <0.03 <0.03   No results for input(s): TROPIPOC in the last 168 hours.  BNPNo results for input(s): BNP, PROBNP in the last 168 hours.  DDimer No results for input(s): DDIMER in the last 168 hours.  Radiology/Studies:  Dg Chest 2 View  Result Date: 10/27/2017 CLINICAL DATA:  Patient with complains of sharp chest pain x 2 days w/ palpitations w/ associated shortness of breath and generalized weakness.  Pain also in abdomen. Hx of acid reflux, abdominal surgery, and hypertension. EXAM: CHEST - 2 VIEW COMPARISON:  06/30/2007 FINDINGS: Cardiac silhouette is mildly enlarged. No mediastinal or hilar masses. No evidence of adenopathy. Clear lungs.  No pleural effusion or pneumothorax. Skeletal structures are intact. IMPRESSION: No  active cardiopulmonary disease. Electronically Signed   By: Amie Portland M.D.   On: 10/27/2017 17:30    Assessment and Plan:   Active Problems:   Unstable angina (HCC)   Essential hypertension   Hyperlipidemia due to dietary fat intake   72 year old gentleman with hypertension hyperlipidemia and former smoker who presented now with prolonged episodes of chest pain does have some features concerning for unstable angina, but he is ruled out currently with 3 negative troponin levels.  Unusual to have prolonged chest pain in the absence of positive troponins if it is cardiac in nature.  More concerning features of the fact that it was somewhat relieved with nitroglycerin and made worse with walking.  At this point I do think ischemic evaluation is warranted.  We will continue IV heparin for now and with nitroglycerin paste.  Continue aspirin and IV Heparin for now   HLD: continue   HTN: Continue beta-blocker, but with sinus tachycardia can likely titrate up to 50 twice daily.  Continue ACE inhibitor and chlorthalidone.   Unfortunately Mr. Mandt is not a very good historian and it makes it very difficult to determine what symptoms he is actually having now. Will reassess tomorrow, will let him eat today.  Echocardiogram results are pending but on preliminary read I do not see anything significant. We will determine course of action tomorrow regarding ischemic evaluation with either invasive or noninvasive modality. -Cath Lab board looks like it may be quite busy for Monday and therefore I think it may not be a bad idea to start with a nuclear stress test on Monday.     For questions or updates, please contact [facility] Please consult [email or web address] for contact info under Cardiology/STEMI.   Signed, Bryan Lemma, MD  10/28/2017 11:58 AM

## 2017-10-28 NOTE — Progress Notes (Addendum)
[facility] [place] [facility]   PATIENT NAME: Brian Montes    MR#:  [id number]  DATE OF BIRTH:  05-16-1945  SUBJECTIVE:  CHIEF COMPLAINT:   Chief Complaint  Patient presents with  . Chest Pain   The patient still has chest pain on the left side, which is intermittent, 8 out of 10, sharp without radiation. REVIEW OF SYSTEMS:  Review of Systems  Constitutional: Negative for chills, fever and malaise/fatigue.  HENT: Negative for sore throat.   Eyes: Negative for blurred vision and double vision.  Respiratory: Negative for cough, hemoptysis, shortness of breath, wheezing and stridor.   Cardiovascular: Positive for chest pain. Negative for palpitations, orthopnea and leg swelling.  Gastrointestinal: Negative for abdominal pain, blood in stool, diarrhea, melena, nausea and vomiting.  Genitourinary: Negative for dysuria, flank pain and hematuria.  Musculoskeletal: Negative for back pain and joint pain.  Skin: Negative for rash.  Neurological: Negative for dizziness, sensory change, focal weakness, seizures, loss of consciousness, weakness and headaches.  Endo/Heme/Allergies: Negative for polydipsia.  Psychiatric/Behavioral: Negative for depression. The patient is not nervous/anxious.     DRUG ALLERGIES:   Allergies  Allergen Reactions  . Codeine Other (See Comments)   VITALS:  Blood pressure (!) 142/88, pulse 64, temperature 99.2 F (37.3 C), temperature source Oral, resp. rate 16, height 5\' 9"  (1.753 m), weight 181 lb 9.6 oz (82.4 kg), SpO2 99 %. PHYSICAL EXAMINATION:  Physical Exam  Constitutional: He is oriented to person, place, and time. He appears well-developed.  HENT:  Head: Normocephalic.  Mouth/Throat: Oropharynx is clear and moist.  Eyes: Pupils are equal, round, and reactive to light. Conjunctivae and EOM are normal. No scleral icterus.  Neck: Normal range of motion. Neck supple. No JVD present. No tracheal deviation present.    Cardiovascular: Normal rate, regular rhythm and normal heart sounds. Exam reveals no gallop.  No murmur heard. Pulmonary/Chest: Effort normal and breath sounds normal. No respiratory distress. He has no wheezes. He has no rales.  Abdominal: Soft. Bowel sounds are normal. He exhibits no distension. There is no tenderness. There is no rebound.  Musculoskeletal: Normal range of motion. He exhibits no edema or tenderness.  Neurological: He is alert and oriented to person, place, and time. No cranial nerve deficit.  Skin: No rash noted. No erythema.  Psychiatric: He has a normal mood and affect.   LABORATORY PANEL:  Male CBC Recent Labs  Lab 10/28/17 0422  WBC 10.1  HGB 12.3*  HCT 35.0*  PLT 160   ------------------------------------------------------------------------------------------------------------------ Chemistries  Recent Labs  Lab 10/27/17 1646 10/28/17 0422  NA 132* 135  K 3.6 4.1  CL 99 101  CO2 24 25  GLUCOSE 159* 111*  BUN 10 11  CREATININE 0.81 0.77  CALCIUM 8.9 8.8*  AST 36  --   ALT 41  --   ALKPHOS 127*  --   BILITOT 1.0  --    RADIOLOGY:  Dg Chest 2 View  Result Date: 10/27/2017 CLINICAL DATA:  Patient with complains of sharp chest pain x 2 days w/ palpitations w/ associated shortness of breath and generalized weakness. Pain also in abdomen. Hx of acid reflux, abdominal surgery, and hypertension. EXAM: CHEST - 2 VIEW COMPARISON:  06/30/2007 FINDINGS: Cardiac silhouette is mildly enlarged. No mediastinal or hilar masses. No evidence of adenopathy. Clear lungs.  No pleural effusion or pneumothorax. Skeletal structures are intact. IMPRESSION: No active cardiopulmonary disease. Electronically Signed   By: Renard Hamper.D.  On: 10/27/2017 17:30   ASSESSMENT AND PLAN:   * Unstable angina Continue heparin drip, Nitropaste, aspirin, statin and metoprolol, will determine course of action tomorrow regarding ischemic evaluation with either invasive or noninvasive  modality per Dr. Herbie BaltimoreHarding. Echo: LV EF: 55% -   65%.  *Hypertension.  Continue lisinopril and chlorthalidone and metoprolol.  *Hyperlipidemia.  On Lipitor.  I discussed with Dr. Herbie BaltimoreHarding. All the records are reviewed and case discussed with Care Management/Social Worker. Management plans discussed with the patient, family and they are in agreement.  CODE STATUS: Full Code  TOTAL TIME TAKING CARE OF THIS PATIENT: 33 minutes.   More than 50% of the time was spent in counseling/coordination of care: YES  POSSIBLE D/C IN 2 DAYS, DEPENDING ON CLINICAL CONDITION.   Shaune PollackQing Flannery Cavallero M.D on 10/28/2017 at 3:06 PM  Between 7am to 6pm - Pager - [phone]  After 6pm go to [email or web address] - Therapist, nutritionalpassword [email or web address]  [facility] [place] [facility]

## 2017-10-28 NOTE — Progress Notes (Signed)
*  PRELIMINARY RESULTS* Echocardiogram 2D Echocardiogram has been performed. Definity IV Contrast used on this study.  [name] 10/28/2017, 10:04 AM

## 2017-10-29 ENCOUNTER — Inpatient Hospital Stay: Payer: Medicare Other

## 2017-10-29 DIAGNOSIS — R140 Abdominal distension (gaseous): Secondary | ICD-10-CM

## 2017-10-29 LAB — LIPID PANEL
CHOLESTEROL: 89 mg/dL (ref 0–200)
HDL: 46 mg/dL (ref >40)
LDL CALC: 30 mg/dL (ref 0–99)
TRIGLYCERIDES: 65 mg/dL (ref <150)
Total CHOL/HDL Ratio: 1.9 RATIO
VLDL: 13 mg/dL (ref 0–40)

## 2017-10-29 LAB — CBC
HCT: 34.3 % — ABNORMAL LOW (ref 40.0–52.0)
Hemoglobin: 12.2 g/dL — ABNORMAL LOW (ref 13.0–18.0)
MCH: 33.3 pg (ref 26.0–34.0)
MCHC: 35.5 g/dL (ref 32.0–36.0)
MCV: 93.8 fL (ref 80.0–100.0)
PLATELETS: 152 10*3/uL (ref 150–440)
RBC: 3.66 MIL/uL — ABNORMAL LOW (ref 4.40–5.90)
RDW: 13.9 % (ref 11.5–14.5)
WBC: 9.5 10*3/uL (ref 3.8–10.6)

## 2017-10-29 LAB — HEPARIN LEVEL (UNFRACTIONATED): HEPARIN UNFRACTIONATED: 0.33 [IU]/mL (ref 0.30–0.70)

## 2017-10-29 MED ORDER — FLEET ENEMA 7-19 GM/118ML RE ENEM: 1.0000 | ENEMA | Freq: Every day | RECTAL | Status: DC | PRN

## 2017-10-29 MED ORDER — SODIUM CHLORIDE 0.9 % IV SOLN
INTRAVENOUS | Status: AC
  Administered 2017-10-29: 13:00:00 via INTRAVENOUS

## 2017-10-29 MED ORDER — KETOROLAC TROMETHAMINE 15 MG/ML IJ SOLN
15.0000 mg | Freq: Four times a day (QID) | INTRAMUSCULAR | Status: AC | PRN
  Administered 2017-10-29 – 2017-11-03 (×3): 15 mg via INTRAVENOUS
  Filled 2017-10-29 (×4): qty 1

## 2017-10-29 MED ORDER — TAMSULOSIN HCL 0.4 MG PO CAPS
0.4000 mg | ORAL_CAPSULE | Freq: Every day | ORAL | Status: DC
  Administered 2017-10-31 – 2017-11-16 (×13): 0.4 mg via ORAL
  Filled 2017-10-29 (×13): qty 1

## 2017-10-29 MED ORDER — BISACODYL 10 MG RE SUPP
10.0000 mg | Freq: Every day | RECTAL | Status: DC
  Administered 2017-10-29 – 2017-11-07 (×2): 10 mg via RECTAL
  Filled 2017-10-29 (×9): qty 1

## 2017-10-29 NOTE — Progress Notes (Signed)
Progress Note  Patient Name: Brian Montes Date of Encounter: 10/29/2017  Primary Cardiologist: No primary care provider on file.   Subjective   No further chest pain.  In fact when he is having now is more abdominal pain. Was able to eat without significant nausea.   Remains nervous.  --Abdominal x-ray films reviewed with Dr. Imogene Burn.  Dramatically distended bowel pattern --> concern for obstipation or constipation.  Likely the primary cause of his symptoms.  Inpatient Medications    Scheduled Meds: . amitriptyline  50 mg Oral QHS  . aspirin EC  81 mg Oral Daily  . atorvastatin  40 mg Oral QHS  . bisacodyl  10 mg Rectal Daily  . chlorthalidone  25 mg Oral BID  . docusate sodium  100 mg Oral BID  . doxazosin  1 mg Oral Daily  . FLUoxetine  20 mg Oral Daily  . heparin  4,000 Units Intravenous Once  . lisinopril  20 mg Oral Daily  . metoprolol tartrate  25 mg Oral BID  . pantoprazole  40 mg Oral Daily  . tamsulosin  0.4 mg Oral QHS   Continuous Infusions: . sodium chloride 75 mL/hr at 10/29/17 1255   PRN Meds: acetaminophen **OR** acetaminophen, albuterol, alum & mag hydroxide-simeth, diphenhydrAMINE, ketorolac, ondansetron **OR** ondansetron (ZOFRAN) IV, polyethylene glycol, sodium phosphate, traMADol   Vital Signs    Vitals:   10/28/17 1723 10/28/17 1926 10/29/17 0408 10/29/17 0825  BP: 122/66 124/79 107/61 96/64  Pulse: 67 70 62 64  Resp:  (!) 26 19 18   Temp: 99.5 F (37.5 C) 98.8 F (37.1 C) 98.5 F (36.9 C) 97.9 F (36.6 C)  TempSrc: Oral Oral Oral Oral  SpO2: 97% 100% 97% 95%  Weight:   179 lb 4.8 oz (81.3 kg)   Height:        Intake/Output Summary (Last 24 hours) at 10/29/2017 1342 Last data filed at 10/29/2017 0500 Gross per 24 hour  Intake -  Output 500 ml  Net -500 ml   Filed Weights   10/27/17 2139 10/28/17 0308 10/29/17 0408  Weight: 186 lb 3.2 oz (84.5 kg) 181 lb 9.6 oz (82.4 kg) 179 lb 4.8 oz (81.3 kg)    Telemetry    Sinus rhythm 70s-  Personally Reviewed  ECG    No new EKG- Personally Reviewed  Physical Exam   GEN:  Was resting comfortably when I came in, but with breathing or coughing is notable dyspnea..   Neck: No JVD or carotid bruit Cardiac:  Normal S1-S2.  RRR, no murmurs, rubs, or gallops.  Respiratory: Clear to auscultation bilaterally.  Nonlabored  GI:  Diffusely tender, most notably in the right middle and lower quadrant.  Distended and tympanitic.  No rebound MS: No edema; No deformity. Neuro:  Nonfocal  Psych: Normal affect   Labs    Chemistry Recent Labs  Lab 10/27/17 1646 10/28/17 0422  NA 132* 135  K 3.6 4.1  CL 99 101  CO2 24 25  GLUCOSE 159* 111*  BUN 10 11  CREATININE 0.81 0.77  CALCIUM 8.9 8.8*  PROT 7.0  --   ALBUMIN 3.7  --   AST 36  --   ALT 41  --   ALKPHOS 127*  --   BILITOT 1.0  --   GFRNONAA >60 >60  GFRAA >60 >60  ANIONGAP 9 9     Hematology Recent Labs  Lab 10/27/17 1646 10/28/17 0422 10/29/17 0407  WBC 9.8 10.1 9.5  RBC 3.97* 3.72* 3.66*  HGB 12.9* 12.3* 12.2*  HCT 37.1* 35.0* 34.3*  MCV 93.3 94.0 93.8  MCH 32.4 33.1 33.3  MCHC 34.7 35.2 35.5  RDW 13.8 13.6 13.9  PLT 184 160 152    Cardiac Enzymes Recent Labs  Lab 10/27/17 1646 10/27/17 2231 10/28/17 0422  TROPONINI <0.03 <0.03 <0.03   No results for input(s): TROPIPOC in the last 168 hours.   BNPNo results for input(s): BNP, PROBNP in the last 168 hours.   DDimer No results for input(s): DDIMER in the last 168 hours.   Radiology    Dg Chest 2 View  Result Date: 10/27/2017 CLINICAL DATA:  Patient with complains of sharp chest pain x 2 days w/ palpitations w/ associated shortness of breath and generalized weakness. Pain also in abdomen. Hx of acid reflux, abdominal surgery, and hypertension. EXAM: CHEST - 2 VIEW COMPARISON:  06/30/2007 FINDINGS: Cardiac silhouette is mildly enlarged. No mediastinal or hilar masses. No evidence of adenopathy. Clear lungs.  No pleural effusion or pneumothorax.  Skeletal structures are intact. IMPRESSION: No active cardiopulmonary disease. Electronically Signed   By: Amie Portlandavid  Ormond M.D.   On: 10/27/2017 17:30   Dg Abd 1 View  Result Date: 10/29/2017 CLINICAL DATA:  Abdominal distention and right upper quadrant pain. EXAM: ABDOMEN - 1 VIEW COMPARISON:  CT abdomen pelvis dated June 30, 2007. FINDINGS: The bowel gas pattern is normal. Moderate amount of stool in the right colon. Air within the nondistended descending and sigmoid colon. Suture material in the right lower quadrant. No radio-opaque calculi or other significant radiographic abnormality are seen. No acute osseous abnormality. IMPRESSION: Negative. Electronically Signed   By: Obie DredgeWilliam T Derry M.D.   On: 10/29/2017 12:17    Cardiac Studies    2D Echo October 28, 2017: Very technically difficult.  Suboptimal windows.  Definity contrast was used but still poor transmission.  EF appears to be 55 to 65%.  Cannot exclude regional wall motion normalities.  No obvious findings.  No notable valve findings.  Unable to fully assess diastolic parameters.  Patient Profile     72 y.o. male with a hx of hypertension, hyperlipidemia and former smoker (with normal coronary arteries and catheterization 2010) who is being seen today for the evaluation of PROLONGED CHEST PAIN/UNSTABLE ANGINA at the request of Dr. Imogene Burnhen & Dr. Elpidio AnisSudini. Ruled out for MI with troponins negative x3.  Abdominal x-ray films this morning reveal distended colon with bowel gas pattern.  Abdomen quite distended and tympanitic/tender.  Assessment & Plan    Active Problems:   Distended abdomen   Unstable angina (HCC)   Essential hypertension   Hyperlipidemia due to dietary fat intake  Patient presented with symptoms were somewhat concerning for unstable angina.  He is ruled out for MI and now has a diffusely distended abdomen with tenderness to palpation in it sounds like that may be more the primary issue.  At this point, we will recommend  stopping heparin, evaluate his GI issues and will hold off on any ischemic evaluation for now.  Would plan for stress test once medically stable.  Echo relatively normal but poor quality.  For now continue current dose of beta-blocker. For borderline pressures would recommend holding chlorthalidone first then ACE inhibitor.     We will follow along once medically stable enough to consider stress testing.   For questions or updates, please contact [facility] Please consult [email or web address] for contact info under Cardiology/STEMI.      Signed, Bryan Lemmaavid Cristo Ausburn,  MD  10/29/2017, 1:42 PM

## 2017-10-29 NOTE — Progress Notes (Signed)
[facility] - [place] [facility]   PATIENT NAME: Brian Montes    MR#:  [id number]  DATE OF BIRTH:  Sep 12, 1945  SUBJECTIVE:  CHIEF COMPLAINT:   Chief Complaint  Patient presents with  . Chest Pain   The patient has abdominal pain and distention, no bowel movement for 3 days.  No chest pain. REVIEW OF SYSTEMS:  Review of Systems  Constitutional: Negative for chills, fever and malaise/fatigue.  HENT: Negative for sore throat.   Eyes: Negative for blurred vision and double vision.  Respiratory: Negative for cough, hemoptysis, shortness of breath, wheezing and stridor.   Cardiovascular: Negative for chest pain, palpitations, orthopnea and leg swelling.  Gastrointestinal: Positive for abdominal pain and constipation. Negative for blood in stool, diarrhea, melena, nausea and vomiting.  Genitourinary: Negative for dysuria, flank pain and hematuria.  Musculoskeletal: Negative for back pain and joint pain.  Skin: Negative for rash.  Neurological: Negative for dizziness, sensory change, focal weakness, seizures, loss of consciousness, weakness and headaches.  Endo/Heme/Allergies: Negative for polydipsia.  Psychiatric/Behavioral: Negative for depression. The patient is not nervous/anxious.     DRUG ALLERGIES:   Allergies  Allergen Reactions  . Codeine Other (See Comments)   VITALS:  Blood pressure 96/64, pulse 64, temperature 97.9 F (36.6 C), temperature source Oral, resp. rate 18, height 5\' 9"  (1.753 m), weight 179 lb 4.8 oz (81.3 kg), SpO2 95 %. PHYSICAL EXAMINATION:  Physical Exam  Constitutional: He is oriented to person, place, and time. He appears well-developed.  HENT:  Head: Normocephalic.  Mouth/Throat: Oropharynx is clear and moist.  Eyes: Pupils are equal, round, and reactive to light. Conjunctivae and EOM are normal. No scleral icterus.  Neck: Normal range of motion. Neck supple. No JVD present. No tracheal deviation present.  Cardiovascular:  Normal rate, regular rhythm and normal heart sounds. Exam reveals no gallop.  No murmur heard. Pulmonary/Chest: Effort normal and breath sounds normal. No respiratory distress. He has no wheezes. He has no rales.  Abdominal: Soft. Bowel sounds are normal. He exhibits no distension. There is no tenderness. There is no rebound.  Musculoskeletal: Normal range of motion. He exhibits no edema or tenderness.  Neurological: He is alert and oriented to person, place, and time. No cranial nerve deficit.  Skin: No rash noted. No erythema.  Psychiatric: He has a normal mood and affect.   LABORATORY PANEL:  Male CBC Recent Labs  Lab 10/29/17 0407  WBC 9.5  HGB 12.2*  HCT 34.3*  PLT 152   ------------------------------------------------------------------------------------------------------------------ Chemistries  Recent Labs  Lab 10/27/17 1646 10/28/17 0422  NA 132* 135  K 3.6 4.1  CL 99 101  CO2 24 25  GLUCOSE 159* 111*  BUN 10 11  CREATININE 0.81 0.77  CALCIUM 8.9 8.8*  AST 36  --   ALT 41  --   ALKPHOS 127*  --   BILITOT 1.0  --    RADIOLOGY:  Dg Abd 1 View  Result Date: 10/29/2017 CLINICAL DATA:  Abdominal distention and right upper quadrant pain. EXAM: ABDOMEN - 1 VIEW COMPARISON:  CT abdomen pelvis dated June 30, 2007. FINDINGS: The bowel gas pattern is normal. Moderate amount of stool in the right colon. Air within the nondistended descending and sigmoid colon. Suture material in the right lower quadrant. No radio-opaque calculi or other significant radiographic abnormality are seen. No acute osseous abnormality. IMPRESSION: Negative. Electronically Signed   By: Obie Dredge M.D.   On: 10/29/2017 12:17   ASSESSMENT AND  PLAN:   * Unstable angina Discontinued heparin drip per Dr. Herbie BaltimoreHarding, continue Nitropaste, aspirin, statin and metoprolol, Would plan for stress test once medically stable per Dr. Herbie BaltimoreHarding. Echo: LV EF: 55% -   65%.  *Hypertension.  Continue Lopressor and  lisinopril and hold chlorthalidone. metoprolol.  *Hyperlipidemia.  On Lipitor.  Abdominal pain distention.  Possible due to constipation.  Abdominal x-ray: Negative. Dulcolax suppository as needed and enema as needed.  I discussed with Dr. Herbie BaltimoreHarding. All the records are reviewed and case discussed with Care Management/Social Worker. Management plans discussed with the patient, family and they are in agreement.  CODE STATUS: Full Code  TOTAL TIME TAKING CARE OF THIS PATIENT: 27 minutes.   More than 50% of the time was spent in counseling/coordination of care: YES  POSSIBLE D/C IN 2 DAYS, DEPENDING ON CLINICAL CONDITION.   Shaune PollackQing Ladarrius Bogdanski M.D on 10/29/2017 at 2:41 PM  Between 7am to 6pm - Pager - [phone]  After 6pm go to [email or web address] - Therapist, nutritionalpassword [email or web address]  [facility] [place] [facility]

## 2017-10-29 NOTE — Progress Notes (Signed)
ANTICOAGULATION CONSULT NOTE - Initial Consult  Pharmacy Consult for Heparin  Indication: chest pain/ACS  Allergies  Allergen Reactions  . Codeine Other (See Comments)    Patient Measurements: Height: 5\' 9"  (175.3 cm) Weight: 181 lb 9.6 oz (82.4 kg) IBW/kg (Calculated) : 70.7 Heparin Dosing Weight:   Vital Signs: Temp: 98.5 F (36.9 C) (07/14 0408) Temp Source: Oral (07/14 0408) BP: 107/61 (07/14 0408) Pulse Rate: 62 (07/14 0408)  Labs: Recent Labs    10/27/17 1646 10/27/17 2032 10/27/17 2119 10/27/17 2231 10/28/17 0422 10/28/17 1044 10/29/17 0407  HGB 12.9*  --   --   --  12.3*  --  12.2*  HCT 37.1*  --   --   --  35.0*  --  34.3*  PLT 184  --   --   --  160  --  152  APTT  --  >160* >160*  --   --   --   --   LABPROT  --  15.0  --   --   --   --   --   INR  --  1.19  --   --   --   --   --   HEPARINUNFRC  --   --   --   --  0.32 0.37 0.33  CREATININE 0.81  --   --   --  0.77  --   --   TROPONINI <0.03  --   --  <0.03 <0.03  --   --     Estimated Creatinine Clearance: 85.9 mL/min (by C-G formula based on SCr of 0.77 mg/dL).   Medical History: Past Medical History:  Diagnosis Date  . Acid reflux   . Essential hypertension   . Hyperlipidemia due to dietary fat intake     Medications:  Medications Prior to Admission  Medication Sig Dispense Refill Last Dose  . amitriptyline (ELAVIL) 50 MG tablet Take 50 mg by mouth at bedtime.    10/26/2017 at 2100  . aspirin EC 81 MG tablet Take 81 mg by mouth daily.   10/27/2017 at 0800  . atorvastatin (LIPITOR) 40 MG tablet Take 40 mg by mouth at bedtime.   10/26/2017 at 2100  . chlorthalidone (HYGROTON) 25 MG tablet Take 25 mg by mouth 2 (two) times daily.   10/26/2017 at 1800  . doxazosin (CARDURA) 1 MG tablet Take 1 mg by mouth daily.   10/27/2017 at 0800  . FLUoxetine (PROZAC) 20 MG capsule Take 20 mg by mouth daily.   10/27/2017 at 0800  . lisinopril (PRINIVIL,ZESTRIL) 20 MG tablet Take 20 mg by mouth daily.   10/27/2017  at 0800  . omeprazole (PRILOSEC) 20 MG capsule Take 20 mg by mouth daily.   10/27/2017 at 0800    Assessment: Pharmacy consulted for heparin drip dosing and monitoring in 72 yo male with ACS/Chest Pain. No anticoagulates reported PTA.    Goal of Therapy:  Heparin level 0.3-0.7 units/ml Monitor platelets by anticoagulation protocol: Yes   Plan:  Baseline labs ordered.  Give 4000 units bolus x 1 Start heparin infusion at 1000 units/hr Check anti-Xa level in 8 hours and daily while on heparin Continue to monitor H&H and platelets  7/13:  HL @ 0430 = 0.32 Will continue this pt on current rate and draw confirmation level on 7/13 @ 10:30.   7/14: HL @ 0400 = 0.33 Will continue this pt on current rate and recheck HL on 7/15 with AM labs.   Abbott Jasinski  D 10/29/2017,5:19 AM

## 2017-10-30 ENCOUNTER — Inpatient Hospital Stay (HOSPITAL_BASED_OUTPATIENT_CLINIC_OR_DEPARTMENT_OTHER): Payer: Medicare Other

## 2017-10-30 ENCOUNTER — Telehealth: Payer: Self-pay

## 2017-10-30 ENCOUNTER — Inpatient Hospital Stay: Payer: Medicare Other

## 2017-10-30 ENCOUNTER — Encounter: Payer: Self-pay | Admitting: Radiology

## 2017-10-30 DIAGNOSIS — I200 Unstable angina: Secondary | ICD-10-CM

## 2017-10-30 DIAGNOSIS — I10 Essential (primary) hypertension: Secondary | ICD-10-CM

## 2017-10-30 DIAGNOSIS — K8011 Calculus of gallbladder with chronic cholecystitis with obstruction: Secondary | ICD-10-CM

## 2017-10-30 LAB — CBC
HCT: 31.5 % — ABNORMAL LOW (ref 40.0–52.0)
HEMOGLOBIN: 11.4 g/dL — AB (ref 13.0–18.0)
MCH: 33.7 pg (ref 26.0–34.0)
MCHC: 36.1 g/dL — ABNORMAL HIGH (ref 32.0–36.0)
MCV: 93.3 fL (ref 80.0–100.0)
Platelets: 151 10*3/uL (ref 150–440)
RBC: 3.37 MIL/uL — ABNORMAL LOW (ref 4.40–5.90)
RDW: 14 % (ref 11.5–14.5)
WBC: 10.2 10*3/uL (ref 3.8–10.6)

## 2017-10-30 LAB — NM MYOCAR MULTI W/SPECT W/WALL MOTION / EF
CHL CUP NUCLEAR SRS: 0
CHL CUP RESTING HR STRESS: 68 {beats}/min
CSEPHR: 58 %
CSEPPHR: 88 {beats}/min
LVDIAVOL: 52 mL (ref 62–150)
LVSYSVOL: 15 mL
SDS: 0
SSS: 0
TID: 1.28

## 2017-10-30 LAB — TROPONIN I: Troponin I: <0.03 ng/mL (ref <0.03)

## 2017-10-30 LAB — HEPARIN LEVEL (UNFRACTIONATED): Heparin Unfractionated: <0.1 IU/mL — ABNORMAL LOW (ref 0.30–0.70)

## 2017-10-30 MED ORDER — SODIUM CHLORIDE 0.9 % IV SOLN
2.0000 g | INTRAVENOUS | Status: DC
  Administered 2017-10-30 – 2017-11-02 (×4): 2 g via INTRAVENOUS
  Filled 2017-10-30 (×2): qty 2
  Filled 2017-10-30 (×2): qty 20
  Filled 2017-10-30 (×2): qty 2

## 2017-10-30 MED ORDER — IOHEXOL 300 MG/ML  SOLN
100.0000 mL | Freq: Once | INTRAMUSCULAR | Status: AC | PRN
  Administered 2017-10-30: 100 mL via INTRAVENOUS

## 2017-10-30 MED ORDER — IOPAMIDOL (ISOVUE-300) INJECTION 61%
15.0000 mL | INTRAVENOUS | Status: AC
  Administered 2017-10-30 (×2): 15 mL via ORAL

## 2017-10-30 MED ORDER — SODIUM CHLORIDE 0.9 % IV SOLN
INTRAVENOUS | Status: AC
  Administered 2017-10-30 – 2017-10-31 (×2): via INTRAVENOUS

## 2017-10-30 MED ORDER — TECHNETIUM TC 99M TETROFOSMIN IV KIT
13.0000 | PACK | Freq: Once | INTRAVENOUS | Status: AC | PRN
  Administered 2017-10-30: 13.69 via INTRAVENOUS

## 2017-10-30 MED ORDER — FLEET ENEMA 7-19 GM/118ML RE ENEM
1.0000 | ENEMA | Freq: Once | RECTAL | Status: AC
  Administered 2017-10-30: 1 via RECTAL

## 2017-10-30 MED ORDER — TECHNETIUM TC 99M TETROFOSMIN IV KIT
33.5000 | PACK | Freq: Once | INTRAVENOUS | Status: AC | PRN
  Administered 2017-10-30: 33.5 via INTRAVENOUS

## 2017-10-30 MED ORDER — REGADENOSON 0.4 MG/5ML IV SOLN
0.4000 mg | Freq: Once | INTRAVENOUS | Status: AC
  Administered 2017-10-30: 0.4 mg via INTRAVENOUS

## 2017-10-30 NOTE — Care Management Important Message (Signed)
Copy of signed IM left with patient in room.  

## 2017-10-30 NOTE — Telephone Encounter (Signed)
TCM....  Patient is being discharged   They saw Dr. Herbie BaltimoreHarding  They are scheduled to see Dr. Kirke CorinArida on 8/15  They were seen for unable angina and essential hypertension  They need to be seen within 1 week   Pt added to wait list   Please call

## 2017-10-30 NOTE — Progress Notes (Signed)
ANTICOAGULATION CONSULT NOTE - Initial Consult  Pharmacy Consult for Heparin  Indication: chest pain/ACS  Allergies  Allergen Reactions  . Codeine Other (See Comments)    Patient Measurements: Height: 5\' 9"  (175.3 cm) Weight: 182 lb 3.2 oz (82.6 kg) IBW/kg (Calculated) : 70.7 Heparin Dosing Weight:   Vital Signs: Temp: 99.3 F (37.4 C) (07/15 0347) Temp Source: Oral (07/15 0347) BP: 105/67 (07/15 0347) Pulse Rate: 85 (07/15 0347)  Labs: Recent Labs    10/27/17 1646 10/27/17 2032 10/27/17 2119 10/27/17 2231  10/28/17 0422 10/28/17 1044 10/29/17 0407 10/30/17 0538  HGB 12.9*  --   --   --   --  12.3*  --  12.2* 11.4*  HCT 37.1*  --   --   --   --  35.0*  --  34.3* 31.5*  PLT 184  --   --   --   --  160  --  152 151  APTT  --  >160* >160*  --   --   --   --   --   --   LABPROT  --  15.0  --   --   --   --   --   --   --   INR  --  1.19  --   --   --   --   --   --   --   HEPARINUNFRC  --   --   --   --    < > 0.32 0.37 0.33 <0.10*  CREATININE 0.81  --   --   --   --  0.77  --   --   --   TROPONINI <0.03  --   --  <0.03  --  <0.03  --   --   --    < > = values in this interval not displayed.    Estimated Creatinine Clearance: 85.9 mL/min (by C-G formula based on SCr of 0.77 mg/dL).   Medical History: Past Medical History:  Diagnosis Date  . Acid reflux   . Essential hypertension   . Hyperlipidemia due to dietary fat intake     Medications:  Medications Prior to Admission  Medication Sig Dispense Refill Last Dose  . amitriptyline (ELAVIL) 50 MG tablet Take 50 mg by mouth at bedtime.    10/26/2017 at 2100  . aspirin EC 81 MG tablet Take 81 mg by mouth daily.   10/27/2017 at 0800  . atorvastatin (LIPITOR) 40 MG tablet Take 40 mg by mouth at bedtime.   10/26/2017 at 2100  . chlorthalidone (HYGROTON) 25 MG tablet Take 25 mg by mouth 2 (two) times daily.   10/26/2017 at 1800  . doxazosin (CARDURA) 1 MG tablet Take 1 mg by mouth daily.   10/27/2017 at 0800  .  FLUoxetine (PROZAC) 20 MG capsule Take 20 mg by mouth daily.   10/27/2017 at 0800  . lisinopril (PRINIVIL,ZESTRIL) 20 MG tablet Take 20 mg by mouth daily.   10/27/2017 at 0800  . omeprazole (PRILOSEC) 20 MG capsule Take 20 mg by mouth daily.   10/27/2017 at 0800    Assessment: Pharmacy consulted for heparin drip dosing and monitoring in 72 yo male with ACS/Chest Pain. No anticoagulates reported PTA.    Goal of Therapy:  Heparin level 0.3-0.7 units/ml Monitor platelets by anticoagulation protocol: Yes   Plan:  Baseline labs ordered.  Give 4000 units bolus x 1 Start heparin infusion at 1000 units/hr Check anti-Xa level in 8  hours and daily while on heparin Continue to monitor H&H and platelets  7/13:  HL @ 0430 = 0.32 Will continue this pt on current rate and draw confirmation level on 7/13 @ 10:30.   7/14: HL @ 0400 = 0.33 Will continue this pt on current rate and recheck HL on 7/15 with AM labs.   Heparin d/c'd per Dr Charise KillianHarding  Laiana Fratus D 10/30/2017,6:59 AM

## 2017-10-30 NOTE — Telephone Encounter (Signed)
Patient had seen Dr. Lady GaryFath in 2010

## 2017-10-30 NOTE — Telephone Encounter (Signed)
The patient is currently admitted. TCM call 7/16.

## 2017-10-30 NOTE — Progress Notes (Addendum)
[employer] [facility] - [place] at [facility]   PATIENT NAME: Brian Montes    MR#:  [id number]  DATE OF BIRTH:  Mar 19, 1946  SUBJECTIVE:  CHIEF COMPLAINT:   Chief Complaint  Patient presents with  . Chest Pain   The patient still has abdominal pain and distention, had bowel movement after enema. REVIEW OF SYSTEMS:  Review of Systems  Constitutional: Negative for chills, fever and malaise/fatigue.  HENT: Negative for sore throat.   Eyes: Negative for blurred vision and double vision.  Respiratory: Negative for cough, hemoptysis, shortness of breath, wheezing and stridor.   Cardiovascular: Negative for chest pain, palpitations, orthopnea and leg swelling.  Gastrointestinal: Positive for abdominal pain. Negative for blood in stool, constipation, diarrhea, melena, nausea and vomiting.  Genitourinary: Negative for dysuria, flank pain and hematuria.  Musculoskeletal: Negative for back pain and joint pain.  Skin: Negative for rash.  Neurological: Negative for dizziness, sensory change, focal weakness, seizures, loss of consciousness, weakness and headaches.  Endo/Heme/Allergies: Negative for polydipsia.  Psychiatric/Behavioral: Negative for depression. The patient is not nervous/anxious.     DRUG ALLERGIES:   Allergies  Allergen Reactions  . Codeine Other (See Comments)   VITALS:  Blood pressure 122/64, pulse 71, temperature 99.3 F (37.4 C), temperature source Oral, resp. rate 18, height 5\' 9"  (1.753 m), weight 182 lb 3.2 oz (82.6 kg), SpO2 95 %. PHYSICAL EXAMINATION:  Physical Exam  Constitutional: He is oriented to person, place, and time. He appears well-developed.  HENT:  Head: Normocephalic.  Mouth/Throat: Oropharynx is clear and moist.  Eyes: Pupils are equal, round, and reactive to light. Conjunctivae and EOM are normal. No scleral icterus.  Neck: Normal range of motion. Neck supple. No JVD present. No tracheal deviation present.  Cardiovascular: Normal rate,  regular rhythm and normal heart sounds. Exam reveals no gallop.  No murmur heard. Pulmonary/Chest: Effort normal and breath sounds normal. No respiratory distress. He has no wheezes. He has no rales.  Abdominal: Bowel sounds are normal. He exhibits distension. There is tenderness. There is no rebound.  Musculoskeletal: Normal range of motion. He exhibits no edema or tenderness.  Neurological: He is alert and oriented to person, place, and time. No cranial nerve deficit.  Skin: No rash noted. No erythema.  Psychiatric: He has a normal mood and affect.   LABORATORY PANEL:  Male CBC Recent Labs  Lab 10/30/17 0538  WBC 10.2  HGB 11.4*  HCT 31.5*  PLT 151   ------------------------------------------------------------------------------------------------------------------ Chemistries  Recent Labs  Lab 10/27/17 1646 10/28/17 0422  NA 132* 135  K 3.6 4.1  CL 99 101  CO2 24 25  GLUCOSE 159* 111*  BUN 10 11  CREATININE 0.81 0.77  CALCIUM 8.9 8.8*  AST 36  --   ALT 41  --   ALKPHOS 127*  --   BILITOT 1.0  --    RADIOLOGY:  Ct Abdomen Pelvis W Contrast  Result Date: 10/30/2017 CLINICAL DATA:  72 year old male with abdominal distension. Subsequent encounter. EXAM: CT ABDOMEN AND PELVIS WITH CONTRAST TECHNIQUE: Multidetector CT imaging of the abdomen and pelvis was performed using the standard protocol following bolus administration of intravenous contrast. CONTRAST:  OMNIPAQUE IOHEXOL 300 MG/ML  SOLN COMPARISON:  10/29/2017 plain film exam.  06/30/2007 CT. FINDINGS: Lower chest: Bibasilar atelectasis with small pleural effusions. Heart size within normal limits. Hepatobiliary: Enlarged inflamed appearing gallbladder with gallstones and pericholecystic fluid highly suspicious for acute cholecystitis. Mild prominence intrahepatic biliary ducts. No common bile duct stone  identified. The inflamed gallbladder may cause impression upon the common bile duct contributing to the intrahepatic  biliary duct prominence. No worrisome primary liver abnormality. Pancreas: No pancreatic mass or primary pancreatic inflammation. Spleen: No splenic mass or enlargement. Adrenals/Urinary Tract: No obstructing stone or hydronephrosis. Bilateral renal lesions larger ones which are cysts others too small to characterize although statistically likely cysts. No adrenal lesion. Noncontrast filled views the urinary bladder unremarkable. Stomach/Bowel: No primary bowel inflammatory process noted. Appendix not visualized possibly removed. No gastric or small bowel abnormality noted. Vascular/Lymphatic: Atherosclerotic changes aorta. No aortic aneurysm or large vessel occlusion. Slightly prominent size portacaval/porta hepatis lymph nodes possibly reactive in origin. Reproductive: No worrisome abnormality. Other: No free air. Small amount of free fluid in the pelvis probably related to the cholecystitis. Musculoskeletal: No worrisome osseous abnormality. IMPRESSION: Enlarged inflamed appearing gallbladder with gallstones and pericholecystic fluid highly suspicious for acute cholecystitis. Mild prominence intrahepatic biliary ducts. No common bile duct stone identified. The inflamed gallbladder may cause impression upon the common bile duct contributing to the intrahepatic biliary duct prominence. Small amount of free fluid probably related to cholecystitis. Slightly prominent size portacaval/porta hepatis lymph nodes may be reactive in origin. Bibasilar atelectasis and small pleural effusions may be related to splinting. Aortic Atherosclerosis (ICD10-I70.0). These results were called by telephone at the time of interpretation on 10/30/2017 at 2:32 pm to Dr. Shaune PollackQING Shahidah Nesbitt , who verbally acknowledged these results. Electronically Signed   By: Lacy DuverneySteven  Olson M.D.   On: 10/30/2017 14:54   ASSESSMENT AND PLAN:   * Unstable angina Discontinued heparin drip and Nitropaste per Dr. Herbie BaltimoreHarding, continue  aspirin, statin and metoprolol,  Stress test is pending. Echo: LV EF: 55% -   65%.  *Hypertension.  Continue Lopressor and lisinopril and hold chlorthalidone. metoprolol.  *Hyperlipidemia.  On Lipitor.  Acute cholecystitis per CT.  Abdominal pain and distention.  Abdominal x-ray: Negative.  Keep n.p.o. with IV fluid support, started Rocephin IV and general surgery consult. Per Dr. Earlene Plateravis, HIDA scan.  Clear liquid diet today.  I discussed with Dr. Earlene Plateravis, surgeon and Dr. Constance Goltzlson, radiologist. I called the patient's son, Mr. Brian Montes, nobody answered the phone. All the records are reviewed and case discussed with Care Management/Social Worker. Management plans discussed with the patient, family and they are in agreement.  CODE STATUS: Full Code  TOTAL TIME TAKING CARE OF THIS PATIENT: 38 minutes.   More than 50% of the time was spent in counseling/coordination of care: YES  POSSIBLE D/C IN 2 DAYS, DEPENDING ON CLINICAL CONDITION.   Brian Montes M.D on 10/30/2017 at 3:05 PM  Between 7am to 6pm - Pager - [phone]  After 6pm go to [email or web address] - Therapist, nutritionalpassword [email or web address]  [facility] [place] [facility]

## 2017-10-30 NOTE — Progress Notes (Signed)
Orders to hold PO meds and keep pt NPO until seen by surgery

## 2017-10-30 NOTE — Progress Notes (Signed)
    Patient presented for stress test ordered by IM this morning. No further chest pain. Continues to note abdominal pain. CT abdomen/pelvis is ordered for this morning. He tolerated the stress test well. Vitals remained stable. He was mildly SOB with administration of Lexiscan. Full report/results are pending at this time.

## 2017-10-30 NOTE — Consult Note (Signed)
SURGICAL CONSULTATION NOTE (initial) - cpt: 04540  HISTORY OF PRESENT ILLNESS (HPI):  72 y.o. male presented to [facility] ED 3 days ago for evaluation of chest pain. Patient is a very poor historian, who reports Left upper chest pain since this past Wednesday or Thursday morning, either from the time he awoke and before he ate anything Thursday or after he ate Wednesday morning. Though he denies having eaten any meat, cheese, dairy, or fried foods, he also says he ate spaghetti and meatballs on Wednesday, but he does not recall when he ate. He says the pain started at his Left chest and sweating with no abdominal pain, made him feel like he was going to "pass out", and then moved from his Left chest to his entire abdomen, where he says it yesterday completely resolved except over his bladder, where he says it still hurts and makes him feel like he needs to urinate. He also states his abdomen has been distended x 3 weeks, but he says he has only been unable to pass a BM without a suppository since the day prior to his current hospital admission. Patient denies any prior RUQ or epigastric abdominal pain after eating, though he then said his RUQ hurts when he walks. He otherwise denies fever/chills, CP, or SOB. He says he believes his pain is due to his gallbladder because he's been told his symptoms are due to his gallbladder, but he says similar about his constipation.  Surgery is consulted by medical physician Dr. Imogene Burn in this context for evaluation and management of cholelithiasis.  PAST MEDICAL HISTORY (PMH):  Past Medical History:  Diagnosis Date  . Acid reflux   . Essential hypertension   . Hyperlipidemia due to dietary fat intake      PAST SURGICAL HISTORY (PSH):  Past Surgical History:  Procedure Laterality Date  . ABDOMINAL SURGERY    . CARDIAC CATHETERIZATION  2010   Dr. Lady Gary - NORMAL CORONARIES     MEDICATIONS:  Prior to Admission medications   Medication Sig Start Date End Date  Taking? Authorizing Provider  amitriptyline (ELAVIL) 50 MG tablet Take 50 mg by mouth at bedtime.  10/09/17  Yes [provider]  aspirin EC 81 MG tablet Take 81 mg by mouth daily.   Yes [provider]  atorvastatin (LIPITOR) 40 MG tablet Take 40 mg by mouth at bedtime. 08/15/17  Yes [provider]  chlorthalidone (HYGROTON) 25 MG tablet Take 25 mg by mouth 2 (two) times daily. 10/10/17  Yes [provider]  doxazosin (CARDURA) 1 MG tablet Take 1 mg by mouth daily. 10/25/17  Yes [provider]  FLUoxetine (PROZAC) 20 MG capsule Take 20 mg by mouth daily.   Yes [provider]  lisinopril (PRINIVIL,ZESTRIL) 20 MG tablet Take 20 mg by mouth daily. 09/12/17  Yes [provider]  omeprazole (PRILOSEC) 20 MG capsule Take 20 mg by mouth daily. 10/09/17  Yes [provider]     ALLERGIES:  Allergies  Allergen Reactions  . Codeine Other (See Comments)     SOCIAL HISTORY:  Social History   Socioeconomic History  . Marital status: Married    Spouse name: Not on file  . Number of children: Not on file  . Years of education: Not on file  . Highest education level: Not on file  Occupational History  . Not on file  Social Needs  . Financial resource strain: Not on file  . Food insecurity:    Worry: Not  on file    Inability: Not on file  . Transportation needs:    Medical: Not on file    Non-medical: Not on file  Tobacco Use  . Smoking status: Former Games developer  . Smokeless tobacco: Current User    Types: Chew  Substance and Sexual Activity  . Alcohol use: Not Currently  . Drug use: Never  . Sexual activity: Not on file  Lifestyle  . Physical activity:    Days per week: Not on file    Minutes per session: Not on file  . Stress: Not on file  Relationships  . Social connections:    Talks on phone: Not on file    Gets together: Not on file    Attends religious service: Not on file    Active member of club or  organization: Not on file    Attends meetings of clubs or organizations: Not on file    Relationship status: Not on file  . Intimate partner violence:    Fear of current or ex partner: Not on file    Emotionally abused: Not on file    Physically abused: Not on file    Forced sexual activity: Not on file  Other Topics Concern  . Not on file  Social History Narrative  . Not on file    The patient currently resides (home / rehab facility / nursing home): Home The patient normally is (ambulatory / bedbound): Ambulatory   FAMILY HISTORY:  Family History  Problem Relation Age of Onset  . Diabetes Brother      REVIEW OF SYSTEMS:  Constitutional: denies weight loss, fever, chills, or sweats  Eyes: denies any other vision changes, history of eye injury  ENT: denies sore throat, hearing problems  Respiratory: denies shortness of breath, wheezing  Cardiovascular: denies chest pain, palpitations  Gastrointestinal: abdominal pain, N/V, and bowel function as per HPI Genitourinary: denies burning with urination or urinary frequency Musculoskeletal: denies any other joint pains or cramps  Skin: denies any other rashes or skin discolorations  Neurological: denies any other headache, dizziness, weakness  Psychiatric: denies any other depression, anxiety   All other review of systems were negative   VITAL SIGNS:  Temp:  [98.4 F (36.9 C)-99.3 F (37.4 C)] 99.3 F (37.4 C) (07/15 0347) Pulse Rate:  [70-85] 71 (07/15 1205) Resp:  [18-19] 18 (07/15 1205) BP: (105-122)/(59-76) 122/64 (07/15 1205) SpO2:  [94 %-98 %] 95 % (07/15 1205) Weight:  [182 lb 3.2 oz (82.6 kg)] 182 lb 3.2 oz (82.6 kg) (07/15 0347)     Height: 5\' 9"  (175.3 cm) Weight: 182 lb 3.2 oz (82.6 kg) BMI (Calculated): 26.89   INTAKE/OUTPUT:  This shift: No intake/output data recorded.  Last 2 shifts: @IOLAST2SHIFTS @   PHYSICAL EXAM:  Constitutional:  -- Obese body habitus  -- Awake, alert, and oriented x3, no apparent  distress Eyes:  -- Pupils equally round and reactive to light  -- No scleral icterus, B/L no occular discharge Ear, nose, throat: -- Neck is FROM WNL -- No jugular venous distension  Pulmonary:  -- No wheezes or rhales -- Equal breath sounds bilaterally -- Breathing non-labored at rest Cardiovascular:  -- S1, S2 present  -- No pericardial rubs  Gastrointestinal:  -- Abdomen soft, completely non-tender to palpation except focal suprapubic TTP, abdomen rotund, no guarding or rebound tenderness -- No abdominal masses appreciated, pulsatile or otherwise  Musculoskeletal and Integumentary:  -- Wounds or skin discoloration: Well-healed midline laparotomy incisional scar from subxiphoid to  suprapubic region -- Extremities: B/L UE and LE FROM, hands and feet warm  Neurologic:  -- Motor function: Intact and symmetric -- Sensation: Intact and symmetric Psychiatric:  -- Mood and affect WNL  Labs:  CBC Latest Ref Rng & Units 10/30/2017 10/29/2017 10/28/2017  WBC 3.8 - 10.6 K/uL 10.2 9.5 10.1  Hemoglobin 13.0 - 18.0 g/dL 11.4(L) 12.2(L) 12.3(L)  Hematocrit 40.0 - 52.0 % 31.5(L) 34.3(L) 35.0(L)  Platelets 150 - 440 K/uL 151 152 160   CMP Latest Ref Rng & Units 10/28/2017 10/27/2017 11/18/2011  Glucose 70 - 99 mg/dL 098(J111(H) 191(Y159(H) 782(N138(H)  BUN 8 - 23 mg/dL 11 10 16   Creatinine 0.61 - 1.24 mg/dL 5.620.77 1.300.81 8.651.19  Sodium 135 - 145 mmol/L 135 132(L) 143  Potassium 3.5 - 5.1 mmol/L 4.1 3.6 3.9  Chloride 98 - 111 mmol/L 101 99 104  CO2 22 - 32 mmol/L 25 24 29   Calcium 8.9 - 10.3 mg/dL 7.8(I8.8(L) 8.9 9.4  Total Protein 6.5 - 8.1 g/dL - 7.0 7.9  Total Bilirubin 0.3 - 1.2 mg/dL - 1.0 0.7  Alkaline Phos 38 - 126 U/L - 127(H) 172(H)  AST 15 - 41 U/L - 36 31  ALT 0 - 44 U/L - 41 39   Imaging studies:  CT Abdomen and Pelvis with Oral Contrast (10/30/2017) - personally reviewed and discussed with patient and his son Enlarged inflamed appearing gallbladder with gallstones and pericholecystic fluid highly  suspicious for acute cholecystitis. Mild prominence intrahepatic biliary ducts. No common bile duct stone identified. The inflamed gallbladder may cause impression upon the common bile duct contributing to the intrahepatic biliary duct prominence. Small amount of free fluid probably related to cholecystitis.  Assessment/Plan: (ICD-10's: K80.10) 72 y.o. male admitted for chest pain and concern for unstable angina with highly variable and unclear symptoms, normal WBC and LFT's, but cholelithiasis and dilated gallbladder on CT with possible acute vs chronic cholecystitis, complicated by pertinent comorbidities including HTN, HLD, and what appears to be dementia vs delerium.   - considering likely open cholecystectomy and unclear symptoms, check HIDA   - if unable to obtain HIDA scan until tomorrow, okay with clear liquids diet and NPO after midnight  - medical risk stratification and optimization appreciated for open cholecystectomy  - fentanyl/tramadol or NSAIDs preferred over morphine/dilaudid/oxycodone  - medical management of comorbidities  - DVT prophylaxis  All of the above findings and recommendations were discussed at length with the patient and his son as well as with Dr. Imogene Burnhen, and all of patient's and his family's questions were answered to their expressed satisfaction.  Thank you for the opportunity to participate in this patient's care.   -- Scherrie GerlachJason E. Earlene Plateravis, MD, RPVI [place]: [facility] - Partnering for exceptional care. Office: [phone]

## 2017-10-31 ENCOUNTER — Encounter: Payer: Self-pay | Admitting: *Deleted

## 2017-10-31 DIAGNOSIS — K8011 Calculus of gallbladder with chronic cholecystitis with obstruction: Secondary | ICD-10-CM

## 2017-10-31 MED ORDER — ENOXAPARIN SODIUM 40 MG/0.4ML ~~LOC~~ SOLN
40.0000 mg | SUBCUTANEOUS | Status: DC
  Administered 2017-10-31: 40 mg via SUBCUTANEOUS
  Filled 2017-10-31: qty 0.4

## 2017-10-31 NOTE — Progress Notes (Signed)
Patient to transfer to 1A as no longer needing cardiac monitoring. Report given to 1A RN, orderly called for transportation.

## 2017-10-31 NOTE — Telephone Encounter (Signed)
Patient currently admitted at this time. 

## 2017-10-31 NOTE — Plan of Care (Signed)
  Problem: Activity: Goal: Risk for activity intolerance will decrease Outcome: Progressing   Problem: Nutrition: Goal: Adequate nutrition will be maintained Outcome: Progressing   Problem: Coping: Goal: Level of anxiety will decrease Outcome: Progressing   Problem: Safety: Goal: Ability to remain free from injury will improve Outcome: Progressing   Problem: Education: Goal: Knowledge of General Education information will improve Outcome: Completed/Met

## 2017-10-31 NOTE — Progress Notes (Addendum)
[employer] - [place] at [facility]   PATIENT NAME: Brian Montes    MR#:  [id number]  DATE OF BIRTH:  1945-05-10  SUBJECTIVE:  CHIEF COMPLAINT:   Chief Complaint  Patient presents with  . Chest Pain   The patient has fever this morning, better abdominal pain, still distention, had bowel movement. REVIEW OF SYSTEMS:  Review of Systems  Constitutional: Negative for chills, fever and malaise/fatigue.  HENT: Negative for sore throat.   Eyes: Negative for blurred vision and double vision.  Respiratory: Negative for cough, hemoptysis, shortness of breath, wheezing and stridor.   Cardiovascular: Negative for chest pain, palpitations, orthopnea and leg swelling.  Gastrointestinal: Positive for abdominal pain. Negative for blood in stool, constipation, diarrhea, melena, nausea and vomiting.  Genitourinary: Negative for dysuria, flank pain and hematuria.  Musculoskeletal: Negative for back pain and joint pain.  Skin: Negative for rash.  Neurological: Negative for dizziness, sensory change, focal weakness, seizures, loss of consciousness, weakness and headaches.  Endo/Heme/Allergies: Negative for polydipsia.  Psychiatric/Behavioral: Negative for depression. The patient is not nervous/anxious.     DRUG ALLERGIES:   Allergies  Allergen Reactions  . Codeine Other (See Comments)   VITALS:  Blood pressure 106/65, pulse 65, temperature 97.8 F (36.6 C), temperature source Oral, resp. rate 18, height 5\' 9"  (1.753 m), weight 178 lb 14.4 oz (81.1 kg), SpO2 97 %. PHYSICAL EXAMINATION:  Physical Exam  Constitutional: He is oriented to person, place, and time. He appears well-developed.  HENT:  Head: Normocephalic.  Mouth/Throat: Oropharynx is clear and moist.  Eyes: Pupils are equal, round, and reactive to light. Conjunctivae and EOM are normal. No scleral icterus.  Neck: Normal range of motion. Neck supple. No JVD present. No tracheal deviation present.  Cardiovascular:  Normal rate, regular rhythm and normal heart sounds. Exam reveals no gallop.  No murmur heard. Pulmonary/Chest: Effort normal and breath sounds normal. No respiratory distress. He has no wheezes. He has no rales.  Abdominal: Bowel sounds are normal. He exhibits distension. There is tenderness. There is no rebound.  Musculoskeletal: Normal range of motion. He exhibits no edema or tenderness.  Neurological: He is alert and oriented to person, place, and time. No cranial nerve deficit.  Skin: No rash noted. No erythema.  Psychiatric: He has a normal mood and affect.   LABORATORY PANEL:  Male CBC Recent Labs  Lab 10/30/17 0538  WBC 10.2  HGB 11.4*  HCT 31.5*  PLT 151   ------------------------------------------------------------------------------------------------------------------ Chemistries  Recent Labs  Lab 10/27/17 1646 10/28/17 0422  NA 132* 135  K 3.6 4.1  CL 99 101  CO2 24 25  GLUCOSE 159* 111*  BUN 10 11  CREATININE 0.81 0.77  CALCIUM 8.9 8.8*  AST 36  --   ALT 41  --   ALKPHOS 127*  --   BILITOT 1.0  --    RADIOLOGY:  No results found. ASSESSMENT AND PLAN:   * Atypical chest pain. Heparin drip and Nitropaste were discontinued per Dr. Herbie Baltimore, continue  aspirin, statin and metoprolol, Stress test is pending. Echo: LV EF: 55% -   65%.  *Hypertension.  Continue Lopressor and lisinopril and hold chlorthalidone. metoprolol.  *Hyperlipidemia.  On Lipitor.  Acute cholecystitis per CT.  Abdominal pain and distention.  Abdominal x-ray: Negative.  Continue Rocephin IV, follow-up HIDA scan.  Clear liquid diet.  Follow-up with Dr. Earlene Plater. Nuclear medicine required solid food given to patient for dinner for HIDA tomorrow. I discussed with Dr. Earlene Plater,  who agreed to heart healthy diet. All the records are reviewed and case discussed with Care Management/Social Worker. Management plans discussed with the patient, family and they are in agreement.  CODE STATUS: Full  Code  TOTAL TIME TAKING CARE OF THIS PATIENT: 38 minutes.   More than 50% of the time was spent in counseling/coordination of care: YES  POSSIBLE D/C IN 2 DAYS, DEPENDING ON CLINICAL CONDITION.   Shaune PollackQing Buffi Ewton M.D on 10/31/2017 at 2:21 PM  Between 7am to 6pm - Pager - [phone]  After 6pm go to [email or web address] - Therapist, nutritionalpassword EPAS [facility]  [facility] [place] [facility]

## 2017-11-01 ENCOUNTER — Inpatient Hospital Stay: Payer: Medicare Other

## 2017-11-01 ENCOUNTER — Encounter: Payer: Self-pay | Admitting: *Deleted

## 2017-11-01 MED ORDER — TECHNETIUM TC 99M MEBROFENIN IV KIT
5.0700 | PACK | Freq: Once | INTRAVENOUS | Status: AC | PRN
  Administered 2017-11-01: 5.07 via INTRAVENOUS

## 2017-11-01 MED ORDER — CHLORHEXIDINE GLUCONATE CLOTH 2 % EX PADS
6.0000 | MEDICATED_PAD | Freq: Once | CUTANEOUS | Status: AC
  Administered 2017-11-02: 6 via TOPICAL

## 2017-11-01 MED ORDER — ACETAMINOPHEN 500 MG PO TABS
1000.0000 mg | ORAL_TABLET | ORAL | Status: AC
  Administered 2017-11-02: 1000 mg via ORAL
  Filled 2017-11-01: qty 2

## 2017-11-01 MED ORDER — GABAPENTIN 300 MG PO CAPS
300.0000 mg | ORAL_CAPSULE | ORAL | Status: AC
  Administered 2017-11-02: 300 mg via ORAL
  Filled 2017-11-01: qty 1

## 2017-11-01 NOTE — Progress Notes (Addendum)
[employer] - [place] [facility]   PATIENT NAME: Brian Montes    MR#:  [id number]  DATE OF BIRTH:  03/11/1946  SUBJECTIVE:  CHIEF COMPLAINT:   Chief Complaint  Patient presents with  . Chest Pain   Better abdominal pain and distention. S/p HIDA. REVIEW OF SYSTEMS:  Review of Systems  Constitutional: Negative for chills, fever and malaise/fatigue.  HENT: Negative for sore throat.   Eyes: Negative for blurred vision and double vision.  Respiratory: Negative for cough, hemoptysis, shortness of breath, wheezing and stridor.   Cardiovascular: Negative for chest pain, palpitations, orthopnea and leg swelling.  Gastrointestinal: Positive for abdominal pain. Negative for blood in stool, constipation, diarrhea, melena, nausea and vomiting.  Genitourinary: Negative for dysuria, flank pain and hematuria.  Musculoskeletal: Negative for back pain and joint pain.  Skin: Negative for rash.  Neurological: Negative for dizziness, sensory change, focal weakness, seizures, loss of consciousness, weakness and headaches.  Endo/Heme/Allergies: Negative for polydipsia.  Psychiatric/Behavioral: Negative for depression. The patient is not nervous/anxious.     DRUG ALLERGIES:   Allergies  Allergen Reactions  . Codeine Other (See Comments)   VITALS:  Blood pressure 129/74, pulse 66, temperature 98.2 F (36.8 C), temperature source Oral, resp. rate 18, height 5\' 9"  (1.753 m), weight 178 lb 14.4 oz (81.1 kg), SpO2 100 %. PHYSICAL EXAMINATION:  Physical Exam  Constitutional: He is oriented to person, place, and time. He appears well-developed.  HENT:  Head: Normocephalic.  Mouth/Throat: Oropharynx is clear and moist.  Eyes: Pupils are equal, round, and reactive to light. Conjunctivae and EOM are normal. No scleral icterus.  Neck: Normal range of motion. Neck supple. No JVD present. No tracheal deviation present.  Cardiovascular: Normal rate, regular rhythm and normal heart  sounds. Exam reveals no gallop.  No murmur heard. Pulmonary/Chest: Effort normal and breath sounds normal. No respiratory distress. He has no wheezes. He has no rales.  Abdominal: Bowel sounds are normal. He exhibits distension. There is tenderness. There is no rebound.  Musculoskeletal: Normal range of motion. He exhibits no edema or tenderness.  Neurological: He is alert and oriented to person, place, and time. No cranial nerve deficit.  Skin: No rash noted. No erythema.  Psychiatric: He has a normal mood and affect.   LABORATORY PANEL:  Male CBC Recent Labs  Lab 10/30/17 0538  WBC 10.2  HGB 11.4*  HCT 31.5*  PLT 151   ------------------------------------------------------------------------------------------------------------------ Chemistries  Recent Labs  Lab 10/27/17 1646 10/28/17 0422  NA 132* 135  K 3.6 4.1  CL 99 101  CO2 24 25  GLUCOSE 159* 111*  BUN 10 11  CREATININE 0.81 0.77  CALCIUM 8.9 8.8*  AST 36  --   ALT 41  --   ALKPHOS 127*  --   BILITOT 1.0  --    RADIOLOGY:  Nm Hepatobiliary Liver Func  Result Date: 11/01/2017 CLINICAL DATA:  Right upper quadrant pain EXAM: NUCLEAR MEDICINE HEPATOBILIARY IMAGING TECHNIQUE: Sequential images of the abdomen were obtained out to 60 minutes following intravenous administration of radiopharmaceutical. 4 hour delayed images also obtained. Note that morphine was not administered prior to delayed images due to concern for allergic reaction. RADIOPHARMACEUTICALS:  5.07 mCi Tc-6810m  Choletec IV COMPARISON:  CT abdomen and pelvis October 30, 2017 FINDINGS: Liver uptake of radiotracer is normal. There is prompt visualization of small bowel, indicating patency of the common bile duct. Over a 4 hour time span, gallbladder did not visualize. IMPRESSION: Nonvisualization of  gallbladder over a 4 hour time span. This finding is felt to be indicative of cystic duct obstruction. Cystic duct obstruction is indicative of acute cholecystitis.  Small bowel visualizes, indicating patency of the common bile duct. Electronically Signed   By: Bretta Bang III M.D.   On: 11/01/2017 12:18   ASSESSMENT AND PLAN:   * Atypical chest pain. Heparin drip and Nitropaste were discontinued per Dr. Herbie Baltimore, continue  aspirin, statin and metoprolol, Stress test is normal. Echo: LV EF: 55% -   65%.  *Hypertension.  Continue Lopressor and lisinopril and hold chlorthalidone. metoprolol.  *Hyperlipidemia.  On Lipitor.  Acute cholecystitis per CT.  Abdominal pain and distention.  Abdominal x-ray: Negative.  HIDA scan report acute cholecystitis. Continue Rocephin IV, Clear liquid diet.   NPO after midnight for laparoscopic cholecystectomy, possible open cholecystectomytomorrow afternoon per Dr. Earlene Plater. Hold aspirin.  I discussed with Dr. Earlene Plater. All the records are reviewed and case discussed with Care Management/Social Worker. Management plans discussed with the patient, family and they are in agreement.  CODE STATUS: Full Code  TOTAL TIME TAKING CARE OF THIS PATIENT: 38 minutes.   More than 50% of the time was spent in counseling/coordination of care: YES  POSSIBLE D/C IN 2 DAYS, DEPENDING ON CLINICAL CONDITION.   Shaune Pollack M.D on 11/01/2017 at 4:19 PM  Between 7am to 6pm - Pager - [phone]  After 6pm go to [email or web address] - Therapist, nutritional Hospitalists

## 2017-11-01 NOTE — Progress Notes (Signed)
SURGICAL PROGRESS NOTE (cpt [phone])  Hospital Day(s): 5.   Post op day(s):  Marland Kitchen   Interval History: Patient seen and examined, no acute events or new complaints overnight, completed HIDA imaging study today. Patient reports his pain has somewhat improved with mild remaining RUQ pain, denies fever/chills, N/V, CP, or SOB with +flatus and +BM.  Review of Systems:  Constitutional: denies fever, chills  HEENT: denies cough or congestion  Respiratory: denies any shortness of breath  Cardiovascular: denies chest pain or palpitations  Gastrointestinal: abdominal pain, N/V, and bowel function as per interval history Genitourinary: denies burning with urination or urinary frequency Musculoskeletal: denies pain, decreased motor or sensation Integumentary: denies any other rashes or skin discolorations Neurological: denies HA or vision/hearing changes   Vital signs in last 24 hours: [min-max] current  Temp:  [97.6 F (36.4 C)-98.6 F (37 C)] 98.2 F (36.8 C) (07/17 1046) Pulse Rate:  [66-80] 66 (07/17 1046) Resp:  [18] 18 (07/16 2350) BP: (98-140)/(53-74) 129/74 (07/17 1046) SpO2:  [90 %-100 %] 100 % (07/17 1046)     Height: 5\' 9"  (175.3 cm) Weight: 178 lb 14.4 oz (81.1 kg) BMI (Calculated): 26.41   Intake/Output this shift:  No intake/output data recorded.   Intake/Output last 2 shifts:  @IOLAST2SHIFTS @   Physical Exam:  Constitutional: much more alert, cooperative, and no distress  HENT: normocephalic without obvious abnormality  Eyes: PERRL, EOM's grossly intact and symmetric  Neuro: CN II - XII grossly intact and symmetric without deficit  Respiratory: breathing non-labored at rest  Cardiovascular: regular rate and sinus rhythm  Gastrointestinal: soft, obese, and non-distended with mild RUQ tenderness to palpation during inspiration, well-healed midline laparotomy post-incisional scar Musculoskeletal: UE and LE FROM, no acute wounds, motor and sensation grossly intact, NT    Labs:  CBC Latest Ref Rng & Units 10/30/2017 10/29/2017 10/28/2017  WBC 3.8 - 10.6 K/uL 10.2 9.5 10.1  Hemoglobin 13.0 - 18.0 g/dL 11.4(L) 12.2(L) 12.3(L)  Hematocrit 40.0 - 52.0 % 31.5(L) 34.3(L) 35.0(L)  Platelets 150 - 440 K/uL 151 152 160   CMP Latest Ref Rng & Units 10/28/2017 10/27/2017 11/18/2011  Glucose 70 - 99 mg/dL 604(V) 409(W) 119(J)  BUN 8 - 23 mg/dL 11 10 16   Creatinine 0.61 - 1.24 mg/dL 4.78 2.95 6.21  Sodium 135 - 145 mmol/L 135 132(L) 143  Potassium 3.5 - 5.1 mmol/L 4.1 3.6 3.9  Chloride 98 - 111 mmol/L 101 99 104  CO2 22 - 32 mmol/L 25 24 29   Calcium 8.9 - 10.3 mg/dL 3.0(Q) 8.9 9.4  Total Protein 6.5 - 8.1 g/dL - 7.0 7.9  Total Bilirubin 0.3 - 1.2 mg/dL - 1.0 0.7  Alkaline Phos 38 - 126 U/L - 127(H) 172(H)  AST 15 - 41 U/L - 36 31  ALT 0 - 44 U/L - 41 39   Imaging studies:  HIDA Scan (11/01/2017) Nonvisualization of gallbladder over a 4 hour time span. This finding is felt to be indicative of cystic duct obstruction. Cystic duct obstruction is indicative of acute cholecystitis. Small bowel  visualizes, indicating patency of the common bile duct.  Assessment/Plan: (ICD-10's: K80.10) 72 y.o. 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   - okay to continue clear liquids diet today             -  NPO after midnight for laparoscopic cholecystectomy, possible open cholecystectomy tomorrow afternoon  - all risks, benefits, and alternatives to cholecystectomy were discussed with the patient, all of his questions were answered to his expressed satisfaction, patient expresses he wishes to proceed, and informed consent was obtained accordingly.             - medical risk  stratification and optimization appreciated for open cholecystectomy             - medical management of comorbidities             - DVT prophylaxis  All of the above findings and recommendations were discussed with the patient, Dr. Imogene Burnhen, and patient's RN, and all of patient's questions were answered to his expressed satisfaction.  Thank you for the opportunity to participate in this patient's care.  -- Scherrie GerlachJason E. Earlene Plateravis, MD, RPVI [place]: [facility] General Surgery - Partnering for exceptional care. Office: [phone]

## 2017-11-01 NOTE — Care Management Important Message (Signed)
Important Message  Patient Details  Name: Brian Montes MRN: [id number] Date of Birth: 06/18/1945   Medicare Important Message Given:  Yes    Olegario MessierKathy A Collie Kittel 11/01/2017, 10:40 AM

## 2017-11-01 NOTE — Telephone Encounter (Signed)
Still in the hospital 7/17

## 2017-11-02 ENCOUNTER — Encounter: Payer: Self-pay | Admitting: Certified Registered Nurse Anesthetist

## 2017-11-02 ENCOUNTER — Encounter
Admission: EM | Disposition: A | Discharge status: Home / Self Care | Payer: Self-pay | Source: Home / Self Care | Attending: Internal Medicine

## 2017-11-02 ENCOUNTER — Inpatient Hospital Stay: Payer: Medicare Other | Admitting: Certified Registered Nurse Anesthetist

## 2017-11-02 DIAGNOSIS — K8012 Calculus of gallbladder with acute and chronic cholecystitis without obstruction: Secondary | ICD-10-CM

## 2017-11-02 DIAGNOSIS — K8011 Calculus of gallbladder with chronic cholecystitis with obstruction: Secondary | ICD-10-CM

## 2017-11-02 HISTORY — PX: CHOLECYSTECTOMY: SHX55

## 2017-11-02 LAB — COMPREHENSIVE METABOLIC PANEL
ALBUMIN: 2.9 g/dL — AB (ref 3.5–5.0)
ALT: 31 U/L (ref 0–44)
AST: 30 U/L (ref 15–41)
Alkaline Phosphatase: 192 U/L — ABNORMAL HIGH (ref 38–126)
Anion gap: 5 (ref 5–15)
BILIRUBIN TOTAL: 1.1 mg/dL (ref 0.3–1.2)
BUN: 13 mg/dL (ref 8–23)
CALCIUM: 8.5 mg/dL — AB (ref 8.9–10.3)
CO2: 25 mmol/L (ref 22–32)
CREATININE: 0.84 mg/dL (ref 0.61–1.24)
Chloride: 105 mmol/L (ref 98–111)
GFR calc Af Amer: >60 mL/min (ref >60)
GFR calc non Af Amer: >60 mL/min (ref >60)
GLUCOSE: 99 mg/dL (ref 70–99)
Potassium: 3.6 mmol/L (ref 3.5–5.1)
SODIUM: 135 mmol/L (ref 135–145)
TOTAL PROTEIN: 6.5 g/dL (ref 6.5–8.1)

## 2017-11-02 LAB — CBC
HEMATOCRIT: 31.2 % — AB (ref 40.0–52.0)
Hemoglobin: 11.1 g/dL — ABNORMAL LOW (ref 13.0–18.0)
MCH: 32.8 pg (ref 26.0–34.0)
MCHC: 35.5 g/dL (ref 32.0–36.0)
MCV: 92.5 fL (ref 80.0–100.0)
Platelets: 201 10*3/uL (ref 150–440)
RBC: 3.37 MIL/uL — AB (ref 4.40–5.90)
RDW: 13.6 % (ref 11.5–14.5)
WBC: 5.9 10*3/uL (ref 3.8–10.6)

## 2017-11-02 LAB — MRSA PCR SCREENING: MRSA by PCR: NEGATIVE

## 2017-11-02 SURGERY — LAPAROSCOPIC CHOLECYSTECTOMY
Anesthesia: General | Wound class: Clean Contaminated

## 2017-11-02 MED ORDER — FENTANYL CITRATE (PF) 100 MCG/2ML IJ SOLN
INTRAMUSCULAR | Status: AC
  Administered 2017-11-02: 25 ug via INTRAVENOUS
  Filled 2017-11-02: qty 2

## 2017-11-02 MED ORDER — BUPIVACAINE LIPOSOME 1.3 % IJ SUSP
INTRAMUSCULAR | Status: DC | PRN
  Administered 2017-11-02: 50 mL

## 2017-11-02 MED ORDER — SODIUM CHLORIDE 0.9 % IV SOLN
INTRAVENOUS | Status: DC | PRN
  Administered 2017-11-02: 16:00:00 via INTRAVENOUS

## 2017-11-02 MED ORDER — LACTATED RINGERS IV SOLN
INTRAVENOUS | Status: DC | PRN
  Administered 2017-11-02: 18:00:00 via INTRAVENOUS

## 2017-11-02 MED ORDER — FENTANYL CITRATE (PF) 100 MCG/2ML IJ SOLN
INTRAMUSCULAR | Status: DC | PRN
  Administered 2017-11-02: 25 ug via INTRAVENOUS
  Administered 2017-11-02: 50 ug via INTRAVENOUS
  Administered 2017-11-02: 25 ug via INTRAVENOUS

## 2017-11-02 MED ORDER — BUPIVACAINE HCL 0.5 % IJ SOLN
INTRAMUSCULAR | Status: DC | PRN
  Administered 2017-11-02: 16 mL

## 2017-11-02 MED ORDER — ONDANSETRON HCL 4 MG/2ML IJ SOLN: 4.0000 mg | Freq: Once | INTRAMUSCULAR | Status: DC | PRN

## 2017-11-02 MED ORDER — SODIUM CHLORIDE 0.9 % IV SOLN
INTRAVENOUS | Status: DC | PRN
  Administered 2017-11-02: 17:00:00 via INTRAVENOUS

## 2017-11-02 MED ORDER — DEXTROSE-NACL 5-0.9 % IV SOLN
INTRAVENOUS | Status: DC
Start: 2017-11-02 — End: 2017-11-03
  Administered 2017-11-02 – 2017-11-03 (×2): via INTRAVENOUS

## 2017-11-02 MED ORDER — ONDANSETRON HCL 4 MG/2ML IJ SOLN
INTRAMUSCULAR | Status: AC
  Filled 2017-11-02: qty 2

## 2017-11-02 MED ORDER — ADULT MULTIVITAMIN W/MINERALS CH
1.0000 | ORAL_TABLET | Freq: Every day | ORAL | Status: DC
  Administered 2017-11-03 – 2017-11-07 (×2): 1 via ORAL
  Filled 2017-11-02 (×3): qty 1

## 2017-11-02 MED ORDER — SODIUM CHLORIDE 0.9 % IV SOLN
INTRAVENOUS | Status: DC | PRN
  Administered 2017-11-02: 20 ug/min via INTRAVENOUS

## 2017-11-02 MED ORDER — LIDOCAINE HCL (CARDIAC) PF 100 MG/5ML IV SOSY
PREFILLED_SYRINGE | INTRAVENOUS | Status: DC | PRN
  Administered 2017-11-02: 100 mg via INTRAVENOUS

## 2017-11-02 MED ORDER — DEXAMETHASONE SODIUM PHOSPHATE 10 MG/ML IJ SOLN
INTRAMUSCULAR | Status: AC
  Filled 2017-11-02: qty 1

## 2017-11-02 MED ORDER — ENOXAPARIN SODIUM 40 MG/0.4ML ~~LOC~~ SOLN
40.0000 mg | SUBCUTANEOUS | Status: DC
  Administered 2017-11-03 – 2017-11-16 (×13): 40 mg via SUBCUTANEOUS
  Filled 2017-11-02 (×13): qty 0.4

## 2017-11-02 MED ORDER — EPHEDRINE SULFATE 50 MG/ML IJ SOLN
INTRAMUSCULAR | Status: DC | PRN
  Administered 2017-11-02: 10 mg via INTRAVENOUS
  Administered 2017-11-02: 5 mg via INTRAVENOUS
  Administered 2017-11-02: 10 mg via INTRAVENOUS

## 2017-11-02 MED ORDER — DEXAMETHASONE SODIUM PHOSPHATE 10 MG/ML IJ SOLN
INTRAMUSCULAR | Status: DC | PRN
  Administered 2017-11-02: 5 mg via INTRAVENOUS

## 2017-11-02 MED ORDER — SUGAMMADEX SODIUM 200 MG/2ML IV SOLN
INTRAVENOUS | Status: DC | PRN
  Administered 2017-11-02: 150 mg via INTRAVENOUS

## 2017-11-02 MED ORDER — BUPIVACAINE LIPOSOME 1.3 % IJ SUSP
INTRAMUSCULAR | Status: AC
  Filled 2017-11-02: qty 20

## 2017-11-02 MED ORDER — BUPIVACAINE HCL (PF) 0.5 % IJ SOLN
INTRAMUSCULAR | Status: AC
  Filled 2017-11-02: qty 30

## 2017-11-02 MED ORDER — FENTANYL CITRATE (PF) 100 MCG/2ML IJ SOLN
INTRAMUSCULAR | Status: AC
  Filled 2017-11-02: qty 2

## 2017-11-02 MED ORDER — BUPIVACAINE-EPINEPHRINE (PF) 0.5% -1:200000 IJ SOLN
INTRAMUSCULAR | Status: AC
  Filled 2017-11-02: qty 30

## 2017-11-02 MED ORDER — ROCURONIUM BROMIDE 100 MG/10ML IV SOLN
INTRAVENOUS | Status: DC | PRN
  Administered 2017-11-02: 40 mg via INTRAVENOUS
  Administered 2017-11-02: 10 mg via INTRAVENOUS

## 2017-11-02 MED ORDER — GLYCOPYRROLATE 0.2 MG/ML IJ SOLN
INTRAMUSCULAR | Status: DC | PRN
  Administered 2017-11-02: 0.2 mg via INTRAVENOUS

## 2017-11-02 MED ORDER — SUGAMMADEX SODIUM 200 MG/2ML IV SOLN
INTRAVENOUS | Status: AC
  Filled 2017-11-02: qty 2

## 2017-11-02 MED ORDER — PHENYLEPHRINE HCL 10 MG/ML IJ SOLN
INTRAMUSCULAR | Status: DC | PRN
  Administered 2017-11-02 (×7): 100 ug via INTRAVENOUS

## 2017-11-02 MED ORDER — ACETAMINOPHEN 10 MG/ML IV SOLN
INTRAVENOUS | Status: DC | PRN
  Administered 2017-11-02: 1000 mg via INTRAVENOUS

## 2017-11-02 MED ORDER — FENTANYL CITRATE (PF) 100 MCG/2ML IJ SOLN
25.0000 ug | INTRAMUSCULAR | Status: AC | PRN
  Administered 2017-11-02 (×6): 25 ug via INTRAVENOUS

## 2017-11-02 MED ORDER — ONDANSETRON HCL 4 MG/2ML IJ SOLN
INTRAMUSCULAR | Status: DC | PRN
  Administered 2017-11-02: 4 mg via INTRAVENOUS

## 2017-11-02 MED ORDER — PROPOFOL 10 MG/ML IV BOLUS
INTRAVENOUS | Status: DC | PRN
  Administered 2017-11-02: 150 mg via INTRAVENOUS

## 2017-11-02 MED ORDER — PROPOFOL 10 MG/ML IV BOLUS
INTRAVENOUS | Status: AC
  Filled 2017-11-02: qty 20

## 2017-11-02 SURGICAL SUPPLY — 34 items
APPLIER CLIP ROT 10 11.4 M/L (STAPLE) ×3
CHLORAPREP W/TINT 26ML (MISCELLANEOUS) ×3 IMPLANT
CLIP APPLIE ROT 10 11.4 M/L (STAPLE) ×1 IMPLANT
DECANTER SPIKE VIAL GLASS SM (MISCELLANEOUS) IMPLANT
DERMABOND ADVANCED (GAUZE/BANDAGES/DRESSINGS) ×2
DERMABOND ADVANCED .7 DNX12 (GAUZE/BANDAGES/DRESSINGS) ×1 IMPLANT
DRESSING SURGICEL FIBRLLR 1X2 (HEMOSTASIS) IMPLANT
DRSG SURGICEL FIBRILLAR 1X2 (HEMOSTASIS)
ELECT REM PT RETURN 9FT ADLT (ELECTROSURGICAL) ×3
ELECTRODE REM PT RTRN 9FT ADLT (ELECTROSURGICAL) ×1 IMPLANT
GLOVE BIO SURGEON STRL SZ7 (GLOVE) ×3 IMPLANT
GLOVE BIOGEL PI IND STRL 7.5 (GLOVE) ×3 IMPLANT
GLOVE BIOGEL PI INDICATOR 7.5 (GLOVE) ×6
GOWN STRL REUS W/ TWL LRG LVL3 (GOWN DISPOSABLE) ×4 IMPLANT
GOWN STRL REUS W/TWL LRG LVL3 (GOWN DISPOSABLE) ×8
GRASPER SUT TROCAR 14GX15 (MISCELLANEOUS) ×3 IMPLANT
IRRIGATION STRYKERFLOW (MISCELLANEOUS) ×1 IMPLANT
IRRIGATOR STRYKERFLOW (MISCELLANEOUS) ×3
IV NS 1000ML (IV SOLUTION) ×2
IV NS 1000ML BAXH (IV SOLUTION) ×1 IMPLANT
KIT TURNOVER KIT A (KITS) ×3 IMPLANT
NEEDLE HYPO 22GX1.5 SAFETY (NEEDLE) ×3 IMPLANT
NEEDLE INSUFFLATION 14GA 120MM (NEEDLE) ×3 IMPLANT
NS IRRIG 1000ML POUR BTL (IV SOLUTION) ×3 IMPLANT
PACK LAP CHOLECYSTECTOMY (MISCELLANEOUS) ×3 IMPLANT
POUCH SPECIMEN RETRIEVAL 10MM (ENDOMECHANICALS) ×3 IMPLANT
SCISSORS METZENBAUM CVD 33 (INSTRUMENTS) ×3 IMPLANT
SLEEVE ENDOPATH XCEL 5M (ENDOMECHANICALS) ×6 IMPLANT
SUT MNCRL AB 4-0 PS2 18 (SUTURE) ×3 IMPLANT
SUT VICRYL 0 UR6 27IN ABS (SUTURE) ×3 IMPLANT
SUT VICRYL AB 3-0 FS1 BRD 27IN (SUTURE) ×3 IMPLANT
TROCAR XCEL NON-BLD 11X100MML (ENDOMECHANICALS) ×3 IMPLANT
TROCAR XCEL NON-BLD 5MMX100MML (ENDOMECHANICALS) ×3 IMPLANT
TUBING INSUFFLATION (TUBING) ×3 IMPLANT

## 2017-11-02 NOTE — Anesthesia Procedure Notes (Signed)
Procedure Name: Intubation Date/Time: 11/02/2017 4:21 PM Performed by: Ginger CarneMichelet, Ason Heslin, CRNA Pre-anesthesia Checklist: Patient identified, Emergency Drugs available, Suction available, Patient being monitored and Timeout performed Patient Re-evaluated:Patient Re-evaluated prior to induction Oxygen Delivery Method: Circle system utilized Preoxygenation: Pre-oxygenation with 100% oxygen Induction Type: IV induction Ventilation: Mask ventilation without difficulty and Oral airway inserted - appropriate to patient size Laryngoscope Size: Hyacinth MeekerMiller and 2 Grade View: Grade I Tube type: Oral Tube size: 7.5 mm Number of attempts: 1 Airway Equipment and Method: Stylet Placement Confirmation: ETT inserted through vocal cords under direct vision,  positive ETCO2 and breath sounds checked- equal and bilateral Secured at: 22 cm Tube secured with: Tape Dental Injury: Teeth and Oropharynx as per pre-operative assessment

## 2017-11-02 NOTE — Plan of Care (Signed)

## 2017-11-02 NOTE — Transfer of Care (Signed)
Immediate Anesthesia Transfer of Care Note  Patient: Brian Montes  Procedure(s) Performed: LAPAROSCOPIC CHOLECYSTECTOMY (N/A )  Patient Location: PACU  Anesthesia Type:General  Level of Consciousness: sedated  Airway & Oxygen Therapy: Patient Spontanous Breathing and Patient connected to face mask oxygen  Post-op Assessment: Report given to RN  Post vital signs: Reviewed and stable  Last Vitals:  Vitals Value Taken Time  BP 112/73 11/02/2017  7:16 PM  Temp    Pulse 79 11/02/2017  7:18 PM  Resp 15 11/02/2017  7:18 PM  SpO2 98 % 11/02/2017  7:18 PM  Vitals shown include unvalidated device data.  Last Pain:  Vitals:   11/02/17 0800  TempSrc:   PainSc: 0-No pain      Patients Stated Pain Goal: 2 (11/02/17 0555)  Complications: No apparent anesthesia complications

## 2017-11-02 NOTE — Anesthesia Post-op Follow-up Note (Signed)
Anesthesia QCDR form completed.        

## 2017-11-02 NOTE — Anesthesia Preprocedure Evaluation (Addendum)
Anesthesia Evaluation  Patient identified by MRN, date of birth, ID band Patient awake    Reviewed: Allergy & Precautions, NPO status , Patient's Chart, lab work & pertinent test results  History of Anesthesia Complications Negative for: history of anesthetic complications  Airway Mallampati: II       Dental  (+) Edentulous Upper, Edentulous Lower   Pulmonary neg sleep apnea, neg COPD, former smoker,           Cardiovascular hypertension, Pt. on medications (-) Past MI and (-) CHF (-) dysrhythmias (-) Valvular Problems/Murmurs     Neuro/Psych neg Seizures    GI/Hepatic Neg liver ROS, GERD  Medicated and Controlled,  Endo/Other  neg diabetes  Renal/GU negative Renal ROS     Musculoskeletal   Abdominal   Peds  Hematology   Anesthesia Other Findings   Reproductive/Obstetrics                            Anesthesia Physical Anesthesia Plan  ASA: II  Anesthesia Plan: General   Post-op Pain Management:    Induction: Intravenous  PONV Risk Score and Plan: 2  Airway Management Planned: Oral ETT  Additional Equipment:   Intra-op Plan:   Post-operative Plan:   Informed Consent: I have reviewed the patients History and Physical, chart, labs and discussed the procedure including the risks, benefits and alternatives for the proposed anesthesia with the patient or authorized representative who has indicated his/her understanding and acceptance.     Plan Discussed with:   Anesthesia Plan Comments:         Anesthesia Quick Evaluation

## 2017-11-02 NOTE — Op Note (Addendum)
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)  PROCEDURE(S): (cpt's: 47562) 1.) Laparoscopic Cholecystectomy with gel hand port  INTRAOPERATIVE FINDINGS: Severe pericholecystic inflammation with clearly identified cystic duct and thrombosed cystic artery, clips well-secured, hemostasis at completion of procedure  INTRAOPERATIVE FLUIDS: 1800 mL crystalloid   ESTIMATED BLOOD LOSS: 300 mL  URINE OUTPUT: No foley  SPECIMENS: Gallbladder  IMPLANTS: None  DRAINS: 66F Blake drain to gallbladder fossa of liver  COMPLICATIONS: None apparent   CONDITION AT COMPLETION: Hemodynamically stable and extubated  DISPOSITION: PACU   INDICATION(S) FOR PROCEDURE:  Patient is a 72 y.o. male who [facility] this admission for what was described as and thought to be chest pain. Cardiac workup was not consistent with acute coronary syndrome, and stress test was essentially unremarkable. Abdominal CT was performed for abdominal "distention" (obesity), which did not demonstrate obstruction, ileus, or even severe constipation, but rather suggested cholecystitis, which was confirmed by HIDA. All risks, benefits, and alternatives to cholecystectomy were discussed with patient, who elected to proceed, and informed consent was obtained at that time. Due to anticipated possible need to convert to open cholecystectomy due to post-surgical adhesions s/p remote Right colectomy with laparotomy scar, Dr. Hurman HornPabon's assistance was requested and was greatly appreciated considering the challenging pericholecystic inflammation that was encountered and utility of gel handport.  DETAILS OF PROCEDURE:  Patient was brought to the operating suite and appropriately  identified. General anesthesia was administered, ongoing therapeutic antibiotics were confirmed to have been received, and endotracheal intubation was performed by anesthesiologist, along with NG/OG tube for gastric decompression. In supine position, operative site was prepped and draped in the usual sterile fashion, and following a brief time out, initial 5 mm incision was made in the LUQ 2 cm below the costal margin (Palmer's point) due to patient's prior Right colectomy with midline laparotomy incision. A Verress needle was inserted and its proper position confirmed using aspiration and saline meniscus test.  Upon insufflation of the abdominal cavity with carbon dioxide to a well-tolerated pressure of 12-15 mmHg, 5 mm visiport was inserted with laparoscope and used to inspect the abdominal cavity and its contents with no injuries from insertion of the first trochar noted. Extensive post-surgical adhesions to the midline anterior abdominal wall were noted, though camera was able to carefully navigate across midline to permit insertion of two 5 mm ports along Right costal margin. Blunt dissection and selective electrocautery were then used to lyse patient's midline and peri-umbilical post-surgical anterior abdominal wall adhesions, permitting placement of 11 mm port to the Right of patient's umbilicus. The table was then placed in reverse Trendelenburg position with Right side up. Upon dissecting thick densely adherent rind of omentum and chronic scar tissue, the gallbladder was found to be tensely dilated, requiring decompression to facilitate grasping of the gallbladder. Needle decompression was performed, and gallbladder hydrops was visualized and aspirated.  The apex/dome of the gallbladder was then grasped with an atraumatic grasper passed through the lateral port and retracted apically over the liver. The infundibulum was also grasped and retracted. However, careful dissection of extensive firm and  fibrous rind of inflammatory tissue and peritoneum was required prior to visualization of a relatively short cystic duct at its junction with both the gallbladder specimen and what appeared to be the tented common  bile duct (the latter of which was avoided and protected). Though the cystic artery was not yet able to be identified, the clearly visualized cystic duct was clipped twice on the patient side and once on the gallbladder specimen side close to the gallbladder. A firmly adherent and calcified structure thought be the cystic artery was not able to be safely dissected free of calcified surrounding inflammatory tissue, and decision was made accordingly to begin dissection of the gallbladder from its edematous peritoneal attachments to the liver using electrocautery, first laterally and then from the apex/dome of the gallbladder until the gallbladder was fixed only at the focally adherent wooden calcified structure thought initially to be the cystic artery along the inferomedial aspect of the gallbladder, but the artery was still not safely able to be dissected circumferentially. Accordingly, the peri-umbilical port site was enlarged to permit placement of a hand gelport, through which careful blunt finger fracture of the structure was completed with visualization and confirmation of the thrombosed and calcified cystic artery stump, which was doubly clipped. The gallbladder was then removed from the abdominal cavity and handed off the field for pathology. Hemostasis and secure placement of clips were confirmed, and intra-peritoneal cavity was inspected with no additional findings.  A 57F Blake drain was placed to the gallbladder fossa via one of the 2 RUQ subcostal port sites, after which all remaning ports were then removed under direct visualization, abdominal cavity was desuflated, and hand gelport was removed. All port sites were irrigated/cleaned, Exparel liposomal bupivicaine was injected into  subcutaneous tissues and primarily along hand gelport fascia, which was re-approximated using in two layers using non-looped 0-0 PDS. Buried interrupted 3-0 Vicryl was then used to re-approximate dermis along hand gelport incision, and surgical skin staples were used to re-approximate skin at port sites and along hand gelport incision. Skin was then cleaned, dried, and occlusive adhesive and drain site dressings were applied. Patient was then safely able to be awakened, extubated, and transferred to PACU for post-operative monitoring and care.   I was present for all aspects of the above procedure, and no operative complications were apparent.

## 2017-11-02 NOTE — Telephone Encounter (Signed)
Patient still in the hospital today 11/02/17.

## 2017-11-02 NOTE — Progress Notes (Signed)
[facility] - [place] at [facility]   PATIENT NAME: Brian Montes    MR#:  [id number]  DATE OF BIRTH:  1945/08/31  SUBJECTIVE:  CHIEF COMPLAINT:   Chief Complaint  Patient presents with  . Chest Pain   The patient has no complaints of abdominal pain, better abdominal distention. REVIEW OF SYSTEMS:  Review of Systems  Constitutional: Negative for chills, fever and malaise/fatigue.  HENT: Negative for sore throat.   Eyes: Negative for blurred vision and double vision.  Respiratory: Negative for cough, hemoptysis, shortness of breath, wheezing and stridor.   Cardiovascular: Negative for chest pain, palpitations, orthopnea and leg swelling.  Gastrointestinal: Negative for abdominal pain, blood in stool, constipation, diarrhea, melena, nausea and vomiting.  Genitourinary: Negative for dysuria, flank pain and hematuria.  Musculoskeletal: Negative for back pain and joint pain.  Skin: Negative for rash.  Neurological: Negative for dizziness, sensory change, focal weakness, seizures, loss of consciousness, weakness and headaches.  Endo/Heme/Allergies: Negative for polydipsia.  Psychiatric/Behavioral: Negative for depression. The patient is not nervous/anxious.     DRUG ALLERGIES:   Allergies  Allergen Reactions  . Codeine Other (See Comments)   VITALS:  Blood pressure 125/66, pulse 61, temperature 97.8 F (36.6 C), temperature source Oral, resp. rate 17, height 5\' 9"  (1.753 m), weight 161 lb 1.6 oz (73.1 kg), SpO2 95 %. PHYSICAL EXAMINATION:  Physical Exam  Constitutional: He is oriented to person, place, and time. He appears well-developed.  HENT:  Head: Normocephalic.  Mouth/Throat: Oropharynx is clear and moist.  Eyes: Pupils are equal, round, and reactive to light. Conjunctivae and EOM are normal. No scleral icterus.  Neck: Normal range of motion. Neck supple. No JVD present. No tracheal deviation present.  Cardiovascular: Normal rate, regular rhythm and  normal heart sounds. Exam reveals no gallop.  No murmur heard. Pulmonary/Chest: Effort normal and breath sounds normal. No respiratory distress. He has no wheezes. He has no rales.  Abdominal: Bowel sounds are normal. He exhibits distension. There is no tenderness. There is no rebound and no guarding.  Musculoskeletal: Normal range of motion. He exhibits no edema or tenderness.  Neurological: He is alert and oriented to person, place, and time. No cranial nerve deficit.  Skin: No rash noted. No erythema.  Psychiatric: He has a normal mood and affect.   LABORATORY PANEL:  Male CBC Recent Labs  Lab 11/02/17 0346  WBC 5.9  HGB 11.1*  HCT 31.2*  PLT 201   ------------------------------------------------------------------------------------------------------------------ Chemistries  Recent Labs  Lab 11/02/17 0346  NA 135  K 3.6  CL 105  CO2 25  GLUCOSE 99  BUN 13  CREATININE 0.84  CALCIUM 8.5*  AST 30  ALT 31  ALKPHOS 192*  BILITOT 1.1   RADIOLOGY:  No results found. ASSESSMENT AND PLAN:   * Atypical chest pain. Heparin drip and Nitropaste were discontinued per Dr. Herbie Baltimore, continue  aspirin, statin and metoprolol, Stress test is normal. Echo: LV EF: 55% -   65%.  *Hypertension.  Continue Lopressor and lisinopril and hold chlorthalidone. metoprolol.  *Hyperlipidemia.  On Lipitor.  Acute cholecystitis per CT.  Abdominal pain and distention.  Abdominal x-ray: Negative.  HIDA scan report acute cholecystitis. Continue Rocephin IV, Clear liquid diet.   NPO after midnight for laparoscopic cholecystectomy, possible open cholecystectomythis pm per Dr. Earlene Plater. Hold aspirin.  I discussed with Dr. Earlene Plater. All the records are reviewed and case discussed with Care Management/Social Worker. Management plans discussed with the patient, family and they are  in agreement.  CODE STATUS: Full Code  TOTAL TIME TAKING CARE OF THIS PATIENT: 32 minutes.   More than 50% of the time  was spent in counseling/coordination of care: YES  POSSIBLE D/C IN 1-2 DAYS, DEPENDING ON CLINICAL CONDITION.   Shaune PollackQing Corderro Koloski M.D on 11/02/2017 at 3:12 PM  Between 7am to 6pm - Pager - [phone]  After 6pm go to [email or web address] - Therapist, nutritionalpassword [email or web address]  [facility] [place] [facility]

## 2017-11-02 NOTE — Progress Notes (Signed)
Initial Nutrition Assessment  DOCUMENTATION CODES:   Not applicable  INTERVENTION:   Recommend Premier Protein BID with diet advancement, each supplement provides 160 kcal and 30 grams of protein.   MVI daily  NUTRITION DIAGNOSIS:   Inadequate oral intake related to acute illness as evidenced by NPO status.  GOAL:   Patient will meet greater than or equal to 90% of their needs  MONITOR:   Diet advancement, Labs, Weight trends, Skin, I & O's  REASON FOR ASSESSMENT:   NPO/Clear Liquid Diet    ASSESSMENT:   72 y.o. 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   Met with pt in room today. Pt is a poor historian but reports good appetite and oral intake at baseline. Pt denies any trouble chewing or swallowing and reports that he does not drink any supplements at home. Pt has been mainly on NPO/clear liquid diet since admit. Pt reports eating about 75% of his clear liquid trays. Pt reports his weight is stable; there is no recent weight history in chart to confirm. Per chart, pt appears to have lost ~24lbs since admit; RD is unsure if weight from today is accurate or if pt has had any true weight loss. Pt currently NPO for cholecystectomy today. Recommend Premier Protein when diet advances.   Medications reviewed and include: dulcolax, colace, lovenox, protonix, ceftriaxone, NaCl w/ dextrose @ 71m/hr  Labs reviewed:   NUTRITION - FOCUSED PHYSICAL EXAM:    Most Recent Value  Orbital Region  Mild depletion  Upper Arm Region  Mild depletion  Thoracic and Lumbar Region  No depletion  Buccal Region  Mild depletion  Temple Region  No depletion  Clavicle Bone Region  No depletion  Clavicle and Acromion Bone Region  No  depletion  Scapular Bone Region  No depletion  Dorsal Hand  No depletion  Patellar Region  Mild depletion  Anterior Thigh Region  No depletion  Posterior Calf Region  No depletion  Edema (RD Assessment)  None  Hair  Reviewed  Eyes  Reviewed  Mouth  Reviewed  Skin  Reviewed  Nails  Reviewed     Diet Order:   Diet Order           Diet NPO time specified  Diet effective midnight        Diet - low sodium heart healthy         EDUCATION NEEDS:   Education needs have been addressed  Skin:  Skin Assessment: Reviewed RN Assessment  Last BM:  7/17  Height:   Ht Readings from Last 1 Encounters:  10/27/17 5' 9" (1.753 m)    Weight:   Wt Readings from Last 1 Encounters:  11/02/17 161 lb 1.6 oz (73.1 kg)    Ideal Body Weight:  72.7 kg  BMI:  Body mass index is 23.79 kg/m.  Estimated Nutritional Needs:   Kcal:  1800-2100kcal/day   Protein:  73-88g/day   Fluid:  >1.8L/day   CKoleen DistanceMS, RD, LDN Pager #- [phone]#- [phone] Hours Pager: [phone]

## 2017-11-02 NOTE — Anesthesia Postprocedure Evaluation (Signed)
Anesthesia Post Note  Patient: Brian Montes  Procedure(s) Performed: LAPAROSCOPIC CHOLECYSTECTOMY (N/A )  Patient location during evaluation: PACU Anesthesia Type: General Level of consciousness: awake and alert Pain management: pain level controlled Vital Signs Assessment: post-procedure vital signs reviewed and stable Respiratory status: spontaneous breathing and respiratory function stable Cardiovascular status: stable Anesthetic complications: no     Last Vitals:  Vitals:   11/02/17 2000 11/02/17 2025  BP: (!) 100/55 109/66  Pulse: 82 86  Resp: (!) 21 20  Temp: (!) 36.2 C 36.4 C  SpO2: 96% 98%    Last Pain:  Vitals:   11/02/17 2025  TempSrc: Oral  PainSc:                  [name] K

## 2017-11-03 ENCOUNTER — Encounter: Payer: Self-pay | Admitting: Surgery

## 2017-11-03 LAB — COMPREHENSIVE METABOLIC PANEL
ALBUMIN: 2.7 g/dL — AB (ref 3.5–5.0)
ALK PHOS: 175 U/L — AB (ref 38–126)
ALT: 51 U/L — AB (ref 0–44)
AST: 66 U/L — ABNORMAL HIGH (ref 15–41)
Anion gap: 6 (ref 5–15)
BILIRUBIN TOTAL: 1 mg/dL (ref 0.3–1.2)
BUN: 18 mg/dL (ref 8–23)
CALCIUM: 8 mg/dL — AB (ref 8.9–10.3)
CO2: 23 mmol/L (ref 22–32)
CREATININE: 0.94 mg/dL (ref 0.61–1.24)
Chloride: 109 mmol/L (ref 98–111)
GFR calc non Af Amer: >60 mL/min (ref >60)
GLUCOSE: 145 mg/dL — AB (ref 70–99)
Potassium: 4.5 mmol/L (ref 3.5–5.1)
SODIUM: 138 mmol/L (ref 135–145)
TOTAL PROTEIN: 6 g/dL — AB (ref 6.5–8.1)

## 2017-11-03 LAB — CBC
HCT: 29.1 % — ABNORMAL LOW (ref 40.0–52.0)
HEMOGLOBIN: 10.2 g/dL — AB (ref 13.0–18.0)
MCH: 32.7 pg (ref 26.0–34.0)
MCHC: 34.9 g/dL (ref 32.0–36.0)
MCV: 93.8 fL (ref 80.0–100.0)
Platelets: 210 10*3/uL (ref 150–440)
RBC: 3.11 MIL/uL — ABNORMAL LOW (ref 4.40–5.90)
RDW: 13.5 % (ref 11.5–14.5)
WBC: 8.5 10*3/uL (ref 3.8–10.6)

## 2017-11-03 MED ORDER — KETOROLAC TROMETHAMINE 15 MG/ML IJ SOLN
15.0000 mg | Freq: Four times a day (QID) | INTRAMUSCULAR | Status: AC | PRN
  Administered 2017-11-03 – 2017-11-07 (×10): 15 mg via INTRAVENOUS
  Filled 2017-11-03 (×10): qty 1

## 2017-11-03 MED ORDER — CEFTRIAXONE SODIUM 2 G IJ SOLR
2.0000 g | Freq: Once | INTRAMUSCULAR | Status: AC
  Administered 2017-11-03: 2 g via INTRAVENOUS
  Filled 2017-11-03: qty 2

## 2017-11-03 MED ORDER — TRAMADOL HCL 50 MG PO TABS: 50.0000 mg | ORAL_TABLET | Freq: Four times a day (QID) | ORAL | 0 refills | Status: AC | PRN

## 2017-11-03 MED ORDER — CEPHALEXIN 500 MG PO CAPS
500.0000 mg | ORAL_CAPSULE | Freq: Two times a day (BID) | ORAL | Status: DC
  Administered 2017-11-04: 500 mg via ORAL
  Filled 2017-11-03: qty 1

## 2017-11-03 MED ORDER — CEPHALEXIN 500 MG PO CAPS: 500.0000 mg | ORAL_CAPSULE | Freq: Two times a day (BID) | ORAL | 0 refills | Status: DC

## 2017-11-03 MED ORDER — SODIUM CHLORIDE 0.9 % IV SOLN
2.0000 g | INTRAVENOUS | Status: DC
  Filled 2017-11-03: qty 20

## 2017-11-03 MED ORDER — KETOROLAC TROMETHAMINE 15 MG/ML IJ SOLN: 15.0000 mg | Freq: Four times a day (QID) | INTRAMUSCULAR | Status: DC | PRN

## 2017-11-03 NOTE — Progress Notes (Signed)
Pt called RN to room for pain, nausea, indigestion, and just "feeling really bad today." Pt given IV zofan and maalox. Dr. Imogene Burnhen contacted for PRN pain medication orders as toradol expired, received order to resume toradol. Pt also had not urinated since he was I&O cathed overnight at 3am. Received order to I&O cath pt if bladder scan >400. Bladder scan showed 250ccs. Will continue to monitor.  ClawsonHudson, Latricia HeftKorie G

## 2017-11-03 NOTE — Discharge Summary (Addendum)
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 Acid reflux   . Essential hypertension   . 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 male with a known history of HTN, HL, Past tobacco use presents to the emergency room due to left-sided chest pain that has been ongoing intermittently for 1 year and has progressively worsened.   *Atypical chest pain. Heparin drip and Nitropaste were discontinued per Dr. Herbie BaltimoreHarding, continue  aspirin, statin and metoprolol, Stress test is normal. Echo: LV EF: 55% - 65%.  *Hypertension. Continue Lopressor and lisinopril and hold chlorthalidone. metoprolol.  *Hyperlipidemia. On Lipitor.  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 Abdominal x-ray: Negative.  HIDA scan report acute cholecystitis. He has been treated with Rocephin IV, Clear liquid diet.   S/P Laparoscopic Cholecystectomy with gel hand port.  The patient tolerated diet.  Change to p.o. Keflex. Keep drain until f/u appt next week per Dr. Everlene FarrierPabon. I discussed with Dr. Earlene Plateravis. DISCHARGE CONDITIONS:  Stable, discharge to home today. CONSULTS OBTAINED:   DRUG ALLERGIES:   Allergies  Allergen Reactions  . Codeine Other (See Comments)   DISCHARGE MEDICATIONS:   Allergies as of 11/03/2017      Reactions   Codeine Other (See Comments)      Medication List    TAKE these medications     amitriptyline 50 MG tablet Commonly known as:  ELAVIL Take 50 mg by mouth at bedtime.   aspirin EC 81 MG tablet Take 81 mg by mouth daily.   atorvastatin 40 MG tablet Commonly known as:  LIPITOR Take 40 mg by mouth at bedtime.   cephALEXin 500 MG capsule Commonly known as:  KEFLEX Take 1 capsule (500 mg total) by mouth every 12 (twelve) hours. Start taking on:  11/04/2017   chlorthalidone 25 MG tablet Commonly known as:  HYGROTON Take 25 mg by mouth 2 (two) times daily.   doxazosin 1 MG tablet Commonly known as:  CARDURA Take 1 mg by mouth daily.   FLUoxetine 20 MG capsule Commonly known as:  PROZAC Take 20 mg by mouth daily.   lisinopril 20 MG tablet Commonly known as:  PRINIVIL,ZESTRIL Take 20 mg by mouth daily.   omeprazole 20 MG capsule Commonly known as:  PRILOSEC Take 20 mg by mouth daily.   traMADol 50 MG tablet Commonly known as:  ULTRAM Take 1 tablet (50 mg total) by mouth every 6 (six) hours as needed for up to 3 days for moderate pain or severe pain.        DISCHARGE INSTRUCTIONS:  See AVS.  If you experience worsening of your admission symptoms, develop shortness of breath, life threatening emergency, suicidal or homicidal thoughts you must seek medical attention immediately by calling 911 or calling your MD immediately  if symptoms less severe.  You Must read complete instructions/literature along with all the possible adverse reactions/side effects for all  the Medicines you take and that have been prescribed to you. Take any new Medicines after you have completely understood and accpet all the possible adverse reactions/side effects.   Please note  You were cared for by a hospitalist during your hospital stay. If you have any questions about your discharge medications or the care you received while you were in the hospital after you are discharged, you can call the unit and asked to speak with the hospitalist on call if the hospitalist that took  care of you is not available. Once you are discharged, your primary care physician will handle any further medical issues. Please note that NO REFILLS for any discharge medications will be authorized once you are discharged, as it is imperative that you return to your primary care physician (or establish a relationship with a primary care physician if you do not have one) for your aftercare needs so that they can reassess your need for medications and monitor your lab values.    On the day of Discharge:  VITAL SIGNS:  Blood pressure 135/71, pulse (!) 102, temperature 99.1 F (37.3 C), temperature source Oral, resp. rate 18, height 5\' 9"  (1.753 m), weight 161 lb 13.1 oz (73.4 kg), SpO2 94 %. PHYSICAL EXAMINATION:  GENERAL:  72 y.o.-year-old patient lying in the bed with no acute distress.  EYES: Pupils equal, round, reactive to light and accommodation. No scleral icterus. Extraocular muscles intact.  HEENT: Head atraumatic, normocephalic. Oropharynx and nasopharynx clear.  NECK:  Supple, no jugular venous distention. No thyroid enlargement, no tenderness.  LUNGS: Normal breath sounds bilaterally, no wheezing, rales,rhonchi or crepitation. No use of accessory muscles of respiration.  CARDIOVASCULAR: S1, S2 normal. No murmurs, rubs, or gallops.  ABDOMEN: Soft, tenderness, mild distended. Bowel sounds present. No organomegaly or mass.  EXTREMITIES: No pedal edema, cyanosis, or clubbing.  NEUROLOGIC: Cranial nerves II through XII are intact. Muscle strength 5/5 in all extremities. Sensation intact. Gait not checked.  PSYCHIATRIC: The patient is alert and oriented x 3.  SKIN: No obvious rash, lesion, or ulcer.  DATA REVIEW:   CBC Recent Labs  Lab 11/03/17 0319  WBC 8.5  HGB 10.2*  HCT 29.1*  PLT 210    Chemistries  Recent Labs  Lab 11/03/17 0319  NA 138  K 4.5  CL 109  CO2 23  GLUCOSE 145*  BUN 18  CREATININE 0.94  CALCIUM 8.0*  AST 66*  ALT 51*  ALKPHOS 175*  BILITOT 1.0      Microbiology Results  Results for orders placed or performed during the hospital encounter of 10/27/17  MRSA PCR Screening     Status: None   Collection Time: 11/02/17 12:05 PM  Result Value Ref Range Status   MRSA by PCR NEGATIVE NEGATIVE Final    Comment:        The GeneXpert MRSA Assay (FDA approved for NASAL specimens only), is one component of a comprehensive MRSA colonization surveillance program. It is not intended to diagnose MRSA infection nor to guide or monitor treatment for MRSA infections. Performed at [facility], [address], [place], [place] [address]     RADIOLOGY:  No results found.   Management plans discussed with the patient, family and they are in agreement.  CODE STATUS: Full Code   TOTAL TIME TAKING CARE OF THIS PATIENT:  33 minutes.    Shaune Pollack M.D on 11/03/2017 at 2:33 PM  Between 7am to 6pm - Pager - [phone]  After 6pm go  to [email or web address] - Scientist, research (life sciences) [place] [facility]  Office  [phone]  CC: Primary care physician; Center, [facility]   Note: This dictation was prepared with Dragon dictation along with smaller phrase technology. Any transcriptional errors that result from this process are unintentional.

## 2017-11-03 NOTE — Plan of Care (Signed)

## 2017-11-03 NOTE — Progress Notes (Signed)
CC: GB Subjective: Doing well, taking clears and hungry labs ok , avss  Objective: Vital signs in last 24 hours: Temp:  [97.2 F (36.2 C)-99.1 F (37.3 C)] 99.1 F (37.3 C) (07/19 0800) Pulse Rate:  [75-102] 102 (07/19 0759) Resp:  [14-21] 18 (07/19 0759) BP: (90-145)/(55-73) 135/71 (07/19 0759) SpO2:  [94 %-100 %] 94 % (07/19 0759) Weight:  [73.4 kg (161 lb 13.1 oz)] 73.4 kg (161 lb 13.1 oz) (07/19 0500) Last BM Date: 11/01/17  Intake/Output from previous day: 07/18 0701 - 07/19 0700 In: 2645 [P.O.:480; I.V.:2165] Out: 1300 [Urine:950; Drains:50; Blood:300] Intake/Output this shift: Total I/O In: -  Out: 25 [Drains:25]  Physical exam:  NAD, alert Abd: soft, incision c/d/i, no infection drain serous. No peritonitis  Lab Results: CBC  Recent Labs    11/02/17 0346 11/03/17 0319  WBC 5.9 8.5  HGB 11.1* 10.2*  HCT 31.2* 29.1*  PLT 201 210   BMET Recent Labs    11/02/17 0346 11/03/17 0319  NA 135 138  K 3.6 4.5  CL 105 109  CO2 25 23  GLUCOSE 99 145*  BUN 13 18  CREATININE 0.84 0.94  CALCIUM 8.5* 8.0*   PT/INR No results for input(s): LABPROT, INR in the last 72 hours. ABG No results for input(s): PHART, HCO3 in the last 72 hours.  Invalid input(s): PCO2, PO2  Studies/Results: Nm Hepatobiliary Liver Func  Result Date: 11/01/2017 CLINICAL DATA:  Right upper quadrant pain EXAM: NUCLEAR MEDICINE HEPATOBILIARY IMAGING TECHNIQUE: Sequential images of the abdomen were obtained out to 60 minutes following intravenous administration of radiopharmaceutical. 4 hour delayed images also obtained. Note that morphine was not administered prior to delayed images due to concern for allergic reaction. RADIOPHARMACEUTICALS:  5.07 mCi Tc-32m  Choletec IV COMPARISON:  CT abdomen and pelvis October 30, 2017 FINDINGS: Liver uptake of radiotracer is normal. There is prompt visualization of small bowel, indicating patency of the common bile duct. Over a 4 hour time span, gallbladder  did not visualize. IMPRESSION: Nonvisualization of gallbladder over a 4 hour time span. This finding is felt to be indicative of cystic duct obstruction. Cystic duct obstruction is indicative of acute cholecystitis. Small bowel visualizes, indicating patency of the common bile duct. Electronically Signed   By: Bretta Bang III M.D.   On: 11/01/2017 12:18    Anti-infectives: Anti-infectives (From admission, onward)   Start     Dose/Rate Route Frequency Ordered Stop   11/04/17 1000  cephALEXin (KEFLEX) capsule 500 mg     500 mg Oral Every 12 hours 11/03/17 0934     11/04/17 0000  cephALEXin (KEFLEX) 500 MG capsule     500 mg Oral Every 12 hours 11/03/17 0938     11/03/17 1000  cefTRIAXone (ROCEPHIN) 2 g in sodium chloride 0.9 % 100 mL IVPB  Status:  Discontinued     2 g 200 mL/hr over 30 Minutes Intravenous Every 24 hours 11/03/17 0933 11/03/17 0934   11/03/17 1000  cefTRIAXone (ROCEPHIN) 2 g in sodium chloride 0.9 % 100 mL IVPB     2 g 200 mL/hr over 30 Minutes Intravenous Once 11/03/17 0934     10/30/17 1600  cefTRIAXone (ROCEPHIN) 2 g in sodium chloride 0.9 % 100 mL IVPB  Status:  Discontinued     2 g 200 mL/hr over 30 Minutes Intravenous Every 24 hours 10/30/17 1452 11/03/17 0933      Assessment/Plan:  Doing well No surgical issues Keep drain until f/u appt next week May  dispo as per hospitalist but as per our perspective he may go home    Sterling Bigiego Pabon, MD, [facility]  11/03/2017

## 2017-11-03 NOTE — Progress Notes (Signed)
Pt still does not feel that he is ready to be discharged today, has been nauseated since breakfast this am & did not eat any lunch. Also, has been complaining of significant pain but that he feels "loopy." He has been given toradol and tramadol for pain. Dr. Imogene Burnhen contacted earlier about pt's concerns, & he wanted this RN to contact surgery. Dr. Imogene Burnhen stated that if surgery said pt could stay, then to take out d/c order. Dr. Hazle Quantintron Diaz on call contacted, stated pt could stay until tomorrow. Pt updated.   [place]eechwood TrailsHudson, Latricia HeftKorie G

## 2017-11-03 NOTE — Progress Notes (Signed)
Patient requesting to stay another night. Does not feel he is ready for discharge. Pts Son at bedside and also would like to request pt stay overnight. Will notify MD>

## 2017-11-04 ENCOUNTER — Inpatient Hospital Stay: Payer: Medicare Other

## 2017-11-04 LAB — COMPREHENSIVE METABOLIC PANEL
ALT: 38 U/L (ref 0–44)
ANION GAP: 9 (ref 5–15)
AST: 43 U/L — ABNORMAL HIGH (ref 15–41)
Albumin: 3 g/dL — ABNORMAL LOW (ref 3.5–5.0)
Alkaline Phosphatase: 155 U/L — ABNORMAL HIGH (ref 38–126)
BILIRUBIN TOTAL: 1.2 mg/dL (ref 0.3–1.2)
BUN: 20 mg/dL (ref 8–23)
CO2: 20 mmol/L — ABNORMAL LOW (ref 22–32)
Calcium: 8.2 mg/dL — ABNORMAL LOW (ref 8.9–10.3)
Chloride: 105 mmol/L (ref 98–111)
Creatinine, Ser: 0.97 mg/dL (ref 0.61–1.24)
GFR calc Af Amer: >60 mL/min (ref >60)
Glucose, Bld: 105 mg/dL — ABNORMAL HIGH (ref 70–99)
POTASSIUM: 3.7 mmol/L (ref 3.5–5.1)
Sodium: 134 mmol/L — ABNORMAL LOW (ref 135–145)
TOTAL PROTEIN: 6.4 g/dL — AB (ref 6.5–8.1)

## 2017-11-04 LAB — CBC
HEMATOCRIT: 29.6 % — AB (ref 40.0–52.0)
Hemoglobin: 10.2 g/dL — ABNORMAL LOW (ref 13.0–18.0)
MCH: 32.2 pg (ref 26.0–34.0)
MCHC: 34.5 g/dL (ref 32.0–36.0)
MCV: 93.5 fL (ref 80.0–100.0)
Platelets: 274 10*3/uL (ref 150–440)
RBC: 3.16 MIL/uL — ABNORMAL LOW (ref 4.40–5.90)
RDW: 13.8 % (ref 11.5–14.5)
WBC: 11 10*3/uL — ABNORMAL HIGH (ref 3.8–10.6)

## 2017-11-04 MED ORDER — SENNA 8.6 MG PO TABS
1.0000 | ORAL_TABLET | Freq: Every day | ORAL | Status: DC
  Administered 2017-11-04 – 2017-11-15 (×8): 8.6 mg via ORAL
  Filled 2017-11-04 (×9): qty 1

## 2017-11-04 MED ORDER — SODIUM CHLORIDE 0.9 % IV SOLN
INTRAVENOUS | Status: AC
  Administered 2017-11-04 – 2017-11-07 (×5): via INTRAVENOUS

## 2017-11-04 MED ORDER — SENNA 8.6 MG PO TABS: 1.0000 | ORAL_TABLET | Freq: Every day | ORAL | 0 refills | Status: DC

## 2017-11-04 NOTE — Progress Notes (Signed)
Responded to code blue.  Upon arrival patient sitting up and nautious, begin throwing up.  Patient on RA saturations 91%.  Placed patient on [place] at 4lpm to relieve increased work of breathing. Patient tolerating well.  RT no longer needed at this time.

## 2017-11-04 NOTE — Progress Notes (Signed)
Pt stating he was having trouble breathing, pts color turning bluish/grayish quickly. Initially code blue called, but then cancelled. Pt vomiting. VSS. MD Luberta MutterKonidena notified and now at the bedside. MD Cintron also notified, Orders received CBC and CMP, order placed. MD coming to assess patient.

## 2017-11-04 NOTE — Progress Notes (Signed)
CODE BLUE was called as nurse noticed he turned purple when he was sitting in the chair.  Patient brought back to the bed, vitals were stable, Code blue was canceled.  Saw the patient again he appeared uncomfortable because of severe abdominal pain but alert, awake, oriented.   Patient x-ray of abdomen showed postop ileus.  Seen by surgery, recommended n.p.o., IV fluids Time spent again 15 minutes

## 2017-11-04 NOTE — Progress Notes (Signed)
Patient ID: Santa GeneraBilly J Ding, male   DOB: 07/10/1945, 72 y.o.   MRN: [id number]  Subjective:  Patient refers having abdominal pain, vomiting and distention. At the moment of evaluation the patient is talking in complete sentences, uncomfortable due to distention but able to move.  Objective:   Vitals:   11/04/17 1109 11/04/17 1134  BP: 119/76 131/73  Pulse: 94 96  Resp:    Temp:    SpO2:     CBC Latest Ref Rng & Units 11/03/2017 11/02/2017 10/30/2017  WBC 3.8 - 10.6 K/uL 8.5 5.9 10.2  Hemoglobin 13.0 - 18.0 g/dL 10.2(L) 11.1(L) 11.4(L)  Hematocrit 40.0 - 52.0 % 29.1(L) 31.2(L) 31.5(L)  Platelets 150 - 440 K/uL 210 201 151    BMP Latest Ref Rng & Units 11/03/2017 11/02/2017 10/28/2017  Glucose 70 - 99 mg/dL 409(W145(H) 99 119(J111(H)  BUN 8 - 23 mg/dL 18 13 11   Creatinine 0.61 - 1.24 mg/dL 4.780.94 2.950.84 6.210.77  Sodium 135 - 145 mmol/L 138 135 135  Potassium 3.5 - 5.1 mmol/L 4.5 3.6 4.1  Chloride 98 - 111 mmol/L 109 105 101  CO2 22 - 32 mmol/L 23 25 25   Calcium 8.9 - 10.3 mg/dL 8.0(L) 8.5(L) 8.8(L)    A/P: 72 year old male with acute over chronic cholecystitis status post difficult gallbladder surgery, laparoscopic hand assisted cholecystectomy POD #2. Today with abdominal distention, nausea and vomiting. Post op labs yesterday within normal limits for post cholecystectomy. No leukocytosis, no hyperbilirubinemia. Abdominal xray today show diffuse small bowel distention concerning of post op ileus. Will treat as post op ileus with conservative management at this moment. NPO, IVF's. Patient not actively with nausea and vomiting at the moment of my evaluation but was oriented that if he vomiting recurs he will need an NGT. Patient understood. Will follow new labs. If he deteriorates will order a CT scan of abdomen and pelvis. Will follow closely.   Gae GallopEdgardo Cintrn-Daz, MD

## 2017-11-04 NOTE — Progress Notes (Signed)
[employer] - [place] at [facility]   PATIENT NAME: Brian Montes    MR#:  [id number]  DATE OF BIRTH:  06/27/1945  SUBJECTIVE: Brian Montes complains of abdominal pain, distention, decreased p.o. intake, nausea, constipation.  Supposed to be discharged yesterday but discharge was canceled secondary to severe abdominal pain  CHIEF COMPLAINT:   Chief Complaint  Patient presents with  . Chest Pain   The patient has no complaints of abdominal pain, better abdominal distention. REVIEW OF SYSTEMS:  Review of Systems  Constitutional: Negative for chills, fever and malaise/fatigue.  HENT: Negative for hearing loss and sore throat.   Eyes: Negative for blurred vision, double vision and photophobia.  Respiratory: Negative for cough, hemoptysis, shortness of breath, wheezing and stridor.   Cardiovascular: Negative for chest pain, palpitations, orthopnea and leg swelling.  Gastrointestinal: Positive for abdominal pain, constipation and nausea. Negative for blood in stool, diarrhea, melena and vomiting.  Genitourinary: Negative for dysuria, flank pain, hematuria and urgency.  Musculoskeletal: Negative for back pain, joint pain, myalgias and neck pain.  Skin: Negative for rash.  Neurological: Negative for dizziness, sensory change, focal weakness, seizures, loss of consciousness, weakness and headaches.  Endo/Heme/Allergies: Negative for polydipsia.  Psychiatric/Behavioral: Negative for depression and memory loss. The patient is not nervous/anxious and does not have insomnia.     DRUG ALLERGIES:   Allergies  Allergen Reactions  . Codeine Other (See Comments)   VITALS:  Blood pressure 124/60, pulse 91, temperature 98.3 F (36.8 C), temperature source Oral, resp. rate 18, height 5\' 9"  (1.753 m), weight 74.2 kg (163 lb 9.3 oz), SpO2 94 %. PHYSICAL EXAMINATION:  Physical Exam  Constitutional: He is oriented to person, place, and time. He appears well-developed.  HENT:  Head:  Normocephalic.  Mouth/Throat: Oropharynx is clear and moist.  Eyes: Pupils are equal, round, and reactive to light. Conjunctivae and EOM are normal. No scleral icterus.  Neck: Normal range of motion. Neck supple. No JVD present. No tracheal deviation present.  Cardiovascular: Normal rate, regular rhythm and normal heart sounds. Exam reveals no gallop.  No murmur heard. Pulmonary/Chest: Effort normal and breath sounds normal. No respiratory distress. He has no wheezes. He has no rales.  Abdominal: He exhibits distension. Bowel sounds are decreased. There is generalized tenderness.  Musculoskeletal: Normal range of motion. He exhibits no edema or tenderness.  Neurological: He is alert and oriented to person, place, and time. No cranial nerve deficit.  Skin: No rash noted. No erythema.  Psychiatric: He has a normal mood and affect.   LABORATORY PANEL:  Male CBC Recent Labs  Lab 11/03/17 0319  WBC 8.5  HGB 10.2*  HCT 29.1*  PLT 210   ------------------------------------------------------------------------------------------------------------------ Chemistries  Recent Labs  Lab 11/03/17 0319  NA 138  K 4.5  CL 109  CO2 23  GLUCOSE 145*  BUN 18  CREATININE 0.94  CALCIUM 8.0*  AST 66*  ALT 51*  ALKPHOS 175*  BILITOT 1.0   RADIOLOGY:  No results found. ASSESSMENT AND PLAN:   * Atypical chest pain. Heparin drip and Nitropaste were discontinued per Dr. Herbie BaltimoreHarding, continue  aspirin, statin and metoprolol, Stress test is normal. Echo: LV EF: 55% -   65%.  *Hypertension.  Continue Lopressor and lisinopril and hold chlorthalidone. metoprolol.  *Hyperlipidemia.  On Lipitor.  Acute cholecystitis per CT.  Abdominal pain and distention.  Abdominal x-ray: Negative.  HIDA scan report acute cholecystitis. post cholecystectomy by surgery, patient still have postoperative abdominal pain, distention, 'my plan  is to get x-ray of abdomen evaluate for possible SBO, continue stool softeners,  may discharge later today if everything goes well.  Same I discussed with patient. Discharge instructions are in the computer, discharge summary is done yesterday.  Unable to discharge yesterday due to severe abdominal pain.  All the records are reviewed and case discussed with Care Management/Social Worker. Management plans discussed with the patient, family and they are in agreement.  CODE STATUS: Full Code  TOTAL TIME TAKING CARE OF THIS PATIENT: 32 minutes.   More than 50% of the time was spent in counseling/coordination of care: YES  POSSIBLE D/C IN 1-2 DAYS, DEPENDING ON CLINICAL CONDITION.   Katha Hamming M.D on 11/04/2017 at 9:38 AM  Between 7am to 6pm - Pager - [phone]  After 6pm go to [email or web address] - Therapist, nutritional Hospitalists

## 2017-11-04 NOTE — Progress Notes (Signed)
Pt has not voided since this morning. Bladder scan 495 ml. MD Luberta MutterKonidena notified. Orders received for in and out X1. Order placed.

## 2017-11-04 NOTE — Progress Notes (Signed)
Chaplain responded to a code blue. Pt was alert and communicative. No family was present. Chaplain let Pt know he was present and praying. Pt responded affirmative. Code was cancelled. Chaplain got cleared to leaved.    11/04/17 1100  Clinical Encounter Type  Visited With Patient  Visit Type Spiritual support;Code  Referral From Nurse  Spiritual Encounters  Spiritual Needs Prayer

## 2017-11-05 ENCOUNTER — Inpatient Hospital Stay: Payer: Medicare Other

## 2017-11-05 DIAGNOSIS — K8012 Calculus of gallbladder with acute and chronic cholecystitis without obstruction: Secondary | ICD-10-CM

## 2017-11-05 LAB — COMPREHENSIVE METABOLIC PANEL
ALK PHOS: 118 U/L (ref 38–126)
ALT: 27 U/L (ref 0–44)
AST: 29 U/L (ref 15–41)
Albumin: 2.2 g/dL — ABNORMAL LOW (ref 3.5–5.0)
Anion gap: 6 (ref 5–15)
BILIRUBIN TOTAL: 1.3 mg/dL — AB (ref 0.3–1.2)
BUN: 21 mg/dL (ref 8–23)
CALCIUM: 7.4 mg/dL — AB (ref 8.9–10.3)
CO2: 20 mmol/L — AB (ref 22–32)
CREATININE: 0.81 mg/dL (ref 0.61–1.24)
Chloride: 112 mmol/L — ABNORMAL HIGH (ref 98–111)
GFR calc non Af Amer: >60 mL/min (ref >60)
GLUCOSE: 89 mg/dL (ref 70–99)
Potassium: 4.1 mmol/L (ref 3.5–5.1)
Sodium: 138 mmol/L (ref 135–145)
TOTAL PROTEIN: 5.1 g/dL — AB (ref 6.5–8.1)

## 2017-11-05 LAB — CBC
HCT: 27.5 % — ABNORMAL LOW (ref 40.0–52.0)
Hemoglobin: 9.5 g/dL — ABNORMAL LOW (ref 13.0–18.0)
MCH: 32.6 pg (ref 26.0–34.0)
MCHC: 34.5 g/dL (ref 32.0–36.0)
MCV: 94.2 fL (ref 80.0–100.0)
PLATELETS: 243 10*3/uL (ref 150–440)
RBC: 2.92 MIL/uL — AB (ref 4.40–5.90)
RDW: 13.8 % (ref 11.5–14.5)
WBC: 8.7 10*3/uL (ref 3.8–10.6)

## 2017-11-05 MED ORDER — SODIUM CHLORIDE 0.9 % IV BOLUS
1000.0000 mL | Freq: Once | INTRAVENOUS | Status: AC
  Administered 2017-11-05: 1000 mL via INTRAVENOUS

## 2017-11-05 NOTE — Progress Notes (Addendum)
[place] at [place] NAME: Brian Montes    MR#:  [id number]  DATE OF BIRTH:  23-Jun-1945  SUBJECTIVE: Abdominal pain, distention, nausea.  Had a large BM this morning.  But still has abdominal distention, urine retention.  CHIEF COMPLAINT:   Chief Complaint  Patient presents with  . Chest Pain    REVIEW OF SYSTEMS:  Review of Systems  Constitutional: Negative for chills, fever and malaise/fatigue.  HENT: Negative for hearing loss and sore throat.   Eyes: Negative for blurred vision, double vision and photophobia.  Respiratory: Negative for cough, hemoptysis, shortness of breath, wheezing and stridor.   Cardiovascular: Negative for chest pain, palpitations, orthopnea and leg swelling.  Gastrointestinal: Positive for abdominal pain and nausea. Negative for blood in stool, constipation, diarrhea, melena and vomiting.  Genitourinary: Negative for dysuria, flank pain, hematuria and urgency.  Musculoskeletal: Negative for back pain, joint pain, myalgias and neck pain.  Skin: Negative for rash.  Neurological: Negative for dizziness, sensory change, focal weakness, seizures, loss of consciousness, weakness and headaches.  Endo/Heme/Allergies: Negative for polydipsia.  Psychiatric/Behavioral: Negative for depression and memory loss. The patient is not nervous/anxious and does not have insomnia.     DRUG ALLERGIES:   Allergies  Allergen Reactions  . Codeine Other (See Comments)   VITALS:  Blood pressure (!) 141/78, pulse 91, temperature 98.2 F (36.8 C), temperature source Oral, resp. rate 18, height _0  (1.753 m), weight 74.2 kg (163 lb 9.3 oz), SpO2 96 %. PHYSICAL EXAMINATION:  Physical Exam  Constitutional: He is oriented to person, place, and time. He appears well-developed.  HENT:  Head: Normocephalic.  Mouth/Throat: Oropharynx is clear and moist.  Eyes: Pupils are equal, round, and reactive to light. Conjunctivae and EOM are  normal. No scleral icterus.  Neck: Normal range of motion. Neck supple. No JVD present. No tracheal deviation present.  Cardiovascular: Normal rate, regular rhythm and normal heart sounds. Exam reveals no gallop.  No murmur heard. Pulmonary/Chest: Effort normal and breath sounds normal. No respiratory distress. He has no wheezes. He has no rales.  Abdominal: He exhibits distension. Bowel sounds are decreased. There is generalized tenderness.  Musculoskeletal: Normal range of motion. He exhibits no edema or tenderness.  Neurological: He is alert and oriented to person, place, and time. No cranial nerve deficit.  Skin: No rash noted. No erythema.  Psychiatric: He has a normal mood and affect.   LABORATORY PANEL:  Male CBC Recent Labs  Lab 11/05/17 0505  WBC 8.7  HGB 9.5*  HCT 27.5*  PLT 243   ------------------------------------------------------------------------------------------------------------------ Chemistries  Recent Labs  Lab 11/05/17 0505  NA 138  K 4.1  CL 112*  CO2 20*  GLUCOSE 89  BUN 21  CREATININE 0.81  CALCIUM 7.4*  AST 29  ALT 27  ALKPHOS 118  BILITOT 1.3*   RADIOLOGY:  Dg Abd 1 View  Result Date: 11/04/2017 CLINICAL DATA:  Diffuse abdominal pain 1 week. Recent cholecystectomy 2 days ago. Previous right colectomy. EXAM: ABDOMEN - 1 VIEW COMPARISON:  10/29/2017 FINDINGS: Skin staples over the abdominal wall. Surgical drain over the right upper quadrant. Air and contrast is seen throughout the colon. There are several air-filled mildly dilated small bowel loops likely postoperative ileus. Remainder of the exam is unchanged. IMPRESSION: Several air-filled mildly dilated small bowel loops with air and contrast throughout the colon. Findings likely due to postoperative ileus. Postsurgical change compatible recent cholecystectomy. Surgical drain over the right upper  quadrant. Electronically Signed   By: Marin Olp M.D.   On: 11/04/2017 11:35   ASSESSMENT AND  PLAN:   * Atypical chest pain. Heparin drip and Nitropaste were discontinued per Dr. Ellyn Hack, continue  aspirin, statin and metoprolol, Stress test is normal. Echo: LV EF: 55% -   65%.  *Hypertension.  Continue Lopressor and lisinopril and hold chlorthalidone. metoprolol.  *Hyperlipidemia.  On Lipitor.  Acute cholecystitis per CT. status post lap chole by surgery,    abdominal pain, postop ileus, n.p.o., IV fluids, seen by surgery, follow KUB ordered today.;   plan depends upon the x-ray of abdomen report.  Patient Medbery, CBC largely normal on yesterday's blood work.  Except slightly elevated total bilirubin.  Alk phos level is down from  175-1 18.  Post op urine Retention, insert Foley, start Flomax ' urine retention likely secondary to severe abdominal distention if the urine retention persist to consult urology.   All the records are reviewed and case discussed with Care Management/Social Worker. Management plans discussed with the patient, family and they are in agreement.  CODE STATUS: Full Code  TOTAL TIME TAKING CARE OF THIS PATIENT: 38 minutes.   More than 50% of the time was spent in counseling/coordination of care: YES  POSSIBLE D/C IN 1-2 DAYS, DEPENDING ON CLINICAL CONDITION.   Epifanio Lesches M.D on 11/05/2017 at 8:47 AM  Between 7am to 6pm - Pager - [phone]  After 6pm go to [email or web address] - [employer] [facility]

## 2017-11-05 NOTE — Progress Notes (Signed)
Pt has not voided since previous shift.  Pt bladder scan shows 135ml despite getting 2880ml/hour of NS.  Dr. Marjie SkiffSridharan notified of this and orders placed.  MD states to recheck bladder scan at 0600 if patient has not urinated at that time.  Will continue to monitor.

## 2017-11-05 NOTE — Progress Notes (Signed)
Patient ID: Brian Montes, male   DOB: [birth year], 72 y.o.   MRN: [id number]     [place] Hospital Day(s): 9.   Post op day(s): 3 Days Post-Op.   Interval History: Patient seen and examined, no acute events or new complaints overnight. Patient reports had a large bowel movement today and passed gas this morning, denies nausea or vomiting since yesterday.  Vital signs in last 24 hours: [min-max] current  Temp:  [98.2 F (36.8 C)-98.7 F (37.1 C)] 98.2 F (36.8 C) (07/21 0724) Pulse Rate:  [78-96] 91 (07/21 0724) Resp:  [18] 18 (07/21 0724) BP: (113-141)/(73-78) 141/78 (07/21 0724) SpO2:  [96 %-99 %] 96 % (07/21 0724)     Height: 5' 9"  (175.3 cm) Weight: 74.2 kg (163 lb 9.3 oz) BMI (Calculated): 24.15    Physical Exam:  Constitutional: alert, cooperative and no distress  Respiratory: breathing non-labored at rest  Cardiovascular: regular rate and sinus rhythm  Gastrointestinal: soft, moderate-tender, and distended. Wounds dry and clean, no sign of infection.   Labs:  CBC Latest Ref Rng & Units 11/05/2017 11/04/2017 11/03/2017  WBC 3.8 - 10.6 K/uL 8.7 11.0(H) 8.5  Hemoglobin 13.0 - 18.0 g/dL 9.5(L) 10.2(L) 10.2(L)  Hematocrit 40.0 - 52.0 % 27.5(L) 29.6(L) 29.1(L)  Platelets 150 - 440 K/uL 243 274 210   CMP Latest Ref Rng & Units 11/05/2017 11/04/2017 11/03/2017  Glucose 70 - 99 mg/dL 89 105(H) 145(H)  BUN 8 - 23 mg/dL 21 20 18   Creatinine 0.61 - 1.24 mg/dL 0.81 0.97 0.94  Sodium 135 - 145 mmol/L 138 134(L) 138  Potassium 3.5 - 5.1 mmol/L 4.1 3.7 4.5  Chloride 98 - 111 mmol/L 112(H) 105 109  CO2 22 - 32 mmol/L 20(L) 20(L) 23  Calcium 8.9 - 10.3 mg/dL 7.4(L) 8.2(L) 8.0(L)  Total Protein 6.5 - 8.1 g/dL 5.1(L) 6.4(L) 6.0(L)  Total Bilirubin 0.3 - 1.2 mg/dL 1.3(H) 1.2 1.0  Alkaline Phos 38 - 126 U/L 118 155(H) 175(H)  AST 15 - 41 U/L 29 43(H) 66(H)  ALT 0 - 44 U/L 27 38 51(H)   Imaging studies: No new pertinent imaging studies. New abdominal xray ordered for today for  follow up of ileus.    Assessment/Plan:  72 year old male with acute over chronic cholecystitis status post difficult gallbladder surgery, laparoscopic hand assisted cholecystectomy POD #3.  - Post op ileus: patient today with large bowel movement and refers passed a gas but physical exam still with significant distention. Still with some pain. Will order Abd xray 2 views to compare small bowel gas patter with yesterday abd xray. Labs yesterday did not show significant electrolyte imbalance. Minimal increase in bilirubin and significant decrease in Alk Phos. Continue NPO for now with IVF's hydration. Encourage to ambulate. Will follow.   Arnold Long, MD

## 2017-11-05 NOTE — Progress Notes (Signed)
Pt vomited about 100 ml . MD Luberta MutterKonidena notified. Per MD give PRN Zofran, no other orders at this time.

## 2017-11-06 ENCOUNTER — Inpatient Hospital Stay: Payer: Medicare Other

## 2017-11-06 LAB — SURGICAL PATHOLOGY

## 2017-11-06 MED ORDER — POLYETHYLENE GLYCOL 3350 17 G PO PACK: 17.0000 g | PACK | Freq: Every day | ORAL | Status: DC | PRN

## 2017-11-06 MED ORDER — CEFTRIAXONE SODIUM 2 G IJ SOLR
2.0000 g | INTRAMUSCULAR | Status: DC
  Administered 2017-11-06 – 2017-11-10 (×5): 2 g via INTRAVENOUS
  Filled 2017-11-06 (×5): qty 2

## 2017-11-06 NOTE — Progress Notes (Addendum)
SURGICAL PROGRESS NOTE  Hospital Day(s): 10.   Post op day(s): 4 Days Post-Op.   Interval History: Patient seen and examined, no acute events or new complaints overnight. Patient reports he "feels better" each day, specifically describes resolved pre-surgical epigastric and RUQ abdominal pain and improved nausea with +flatus and +BM (the latter yesterday), though abdominal distention persists. He says he ambulated once today, feels thirsty > hungry, denies fever/chills, CP, or SOB.  Review of Systems:  Constitutional: denies fever, chills  Respiratory: denies any shortness of breath  Cardiovascular: denies chest pain or palpitations  Gastrointestinal: abdominal pain, N/V, and bowel function as per interval history Musculoskeletal: denies pain, decreased motor or sensation Integumentary: denies any other rashes or skin discolorations except post-surgical abdominal wounds  Vital signs in last 24 hours: [min-max] current  Temp:  [97.8 F (36.6 C)-98.6 F (37 C)] 98.6 F (37 C) (07/22 0757) Pulse Rate:  [82-89] 84 (07/22 0757) Resp:  [16-18] 18 (07/22 0757) BP: (142-156)/(76-86) 156/83 (07/22 0757) SpO2:  [95 %-98 %] 98 % (07/22 0757) Weight:  [164 lb 10.9 oz (74.7 kg)] 164 lb 10.9 oz (74.7 kg) (07/22 0500)     Height: 5\' 9"  (175.3 cm) Weight: 164 lb 10.9 oz (74.7 kg) BMI (Calculated): 24.31   Intake/Output this shift:  No intake/output data recorded.   Intake/Output last 2 shifts:  @IOLAST2SHIFTS @   Physical Exam:  Constitutional: alert, cooperative and no distress  Respiratory: breathing non-labored at rest  Cardiovascular: regular rate and sinus rhythm  Gastrointestinal: soft and completely non-tender with all incisions well-approximated without surrounding erythema or drainage, but remains distended, post-surgical drain well-secured with serosanguinous fluid in bulb/tubing  Labs:  CBC Latest Ref Rng & Units 11/05/2017 11/04/2017 11/03/2017  WBC 3.8 - 10.6 K/uL 8.7 11.0(H)  8.5  Hemoglobin 13.0 - 18.0 g/dL 6.2(X9.5(L) 10.2(L) 10.2(L)  Hematocrit 40.0 - 52.0 % 27.5(L) 29.6(L) 29.1(L)  Platelets 150 - 440 K/uL 243 274 210   CMP Latest Ref Rng & Units 11/05/2017 11/04/2017 11/03/2017  Glucose 70 - 99 mg/dL 89 528(U105(H) 132(G145(H)  BUN 8 - 23 mg/dL 21 20 18   Creatinine 0.61 - 1.24 mg/dL 4.010.81 0.270.97 2.530.94  Sodium 135 - 145 mmol/L 138 134(L) 138  Potassium 3.5 - 5.1 mmol/L 4.1 3.7 4.5  Chloride 98 - 111 mmol/L 112(H) 105 109  CO2 22 - 32 mmol/L 20(L) 20(L) 23  Calcium 8.9 - 10.3 mg/dL 7.4(L) 8.2(L) 8.0(L)  Total Protein 6.5 - 8.1 g/dL 5.1(L) 6.4(L) 6.0(L)  Total Bilirubin 0.3 - 1.2 mg/dL 6.6(Y1.3(H) 1.2 1.0  Alkaline Phos 38 - 126 U/L 118 155(H) 175(H)  AST 15 - 41 U/L 29 43(H) 66(H)  ALT 0 - 44 U/L 27 38 51(H)   Imaging studies:  Abdominal X-ray (11/06/2017) - personally reviewed and discussed with patient and his RN Supine views of the abdomen and pelvis at 0846 hours. Right upper quadrant surgical clips and postoperative drain remain in place. Lower abdominal skin staples redemonstrated. Gas-filled small bowel loops remain mildly to moderately dilated and have increased in number throughout the abdomen, measuring up to 39 millimeters diameter. There is a small volume of retained oral contrast throughout the large bowel to the rectum. There is increased and moderate gas distention of the stomach. No pneumoperitoneum is identified on these supine views. Stable visualized osseous structures. The lung bases are not included.  Assessment/Plan: (ICD-10's: 36K80.12) 72 y.o. 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   - minimize narcotics  - ambulation encouraged  - okay with ice chips, sips with meds, and  losenges/hard candies  - if continues +flatus with resolved nausea, may add Boost Breeze clear liquid nutritional supplement  - alternatively, consider NG tube, TPN, and CT to assess for abscess if emesis worsens  - continue to monitor patient's abdominal exam and bowel function  - medical management of comorbidities per medical team  - DVT prophylaxis  All of the above findings and recommendations were discussed with the patient and his medical team, and all of patient's questions were answered to his expressed satisfaction.  Thank you for the opportunity to participate in this patient's care.  -- Scherrie Gerlach Earlene Plater, MD, RPVI [place]: [facility] General Surgery - Partnering for exceptional care. Office: [phone]

## 2017-11-06 NOTE — Evaluation (Signed)
Physical Therapy Evaluation Patient Details Name: Brian Montes MRN: [id number] DOB: 07/04/1945 Today's Date: 11/06/2017   History of Present Illness  Brian Montes  is a 72 y.o. male with a known history of HTN, HL, Past tobacco use presents to the emergency room due to left-sided chest pain that has been ongoing intermittently for 1 year and has progressively worsened.   Postoperative status post laparoscopic cholecystectomy for severe acute on chronic cholecystitis  Clinical Impression  Pt presents to PT with decreased mobility and activity tolerance due to acute hospitalization and pain, from bowel distention/ laparoscopic cholecystectomy. Pt needs min assist with bed mobility due to abdominal pain, but is able to perform sit to stand mod ind w/ RW. Overall pt strength is adequate and pt is able to ambulate with RW CGA. Pt reports feeling light headed when first sitting up on EOB and after ambulation activity ( 200 ft). In both cases sx's reduce after a minute or so. Pt is able to maintain balance and steadiness on feet with RW, but demonstrates impulsivity and lack of focus while doing so. Pt reports that before this incident he was completley ind with all ADL and ambulatory activitites. Pt lives alone in mobile home with 3 steps, bathroom with normal height toilet and tub, and no assistive devices. Pt reports having family to help (son on occasion) but that wife is currently SNF. Pt denies any falls in previous 6 months. Pt would benefit from skilled PT to address above deficits and promote optimal return to PLOF. Recommend transition to HHPT upon discharge from acute hospitalization, for further home accessibility and AD evaluation.   Follow Up Recommendations Home health PT    Equipment Recommendations  Rolling walker with 5" wheels    Recommendations for Other Services       Precautions / Restrictions Precautions Precautions: Fall Restrictions Weight Bearing Restrictions: No       Mobility  Bed Mobility Overal bed mobility: Needs Assistance Bed Mobility: Supine to Sit     Supine to sit: Min assist     General bed mobility comments: Pt requests hand to help bring himself up into trunk flexiiona nd for roation of legs off of bed. Appears ot be related to pain in abdomen, not strength levels  Transfers Overall transfer level: Modified independent Equipment used: Rolling walker (2 wheeled)             General transfer comment: Pt is able to stand without therapist assistance after two intial attempts, and use of RW.   Ambulation/Gait Ambulation/Gait assistance: Min guard Gait Distance (Feet): 200 Feet Assistive device: Rolling walker (2 wheeled) Gait Pattern/deviations: WFL(Within Functional Limits) Gait velocity: normal    General Gait Details: Pt avoids putting weight through medail plantarsurface of L foot. Reports his foot "hurts" but denies anything being wrong with it  Stairs            Wheelchair Mobility    Modified Rankin (Stroke Patients Only)       Balance Overall balance assessment: Modified Independent Sitting-balance support: Feet supported Sitting balance-Leahy Scale: Good     Standing balance support: Bilateral upper extremity supported Standing balance-Leahy Scale: Good Standing balance comment: Pt is stable and balanced with RW during gait and weight shifting. Also pt is able to stand static w/o BUE support                             Pertinent Vitals/Pain Pain  Assessment: 0-10 Pain Score: 4  Pain Location: Abdomen Pain Descriptors / Indicators: Pressure Pain Intervention(s): Limited activity within patient's tolerance;Monitored during session;Repositioned;Utilized relaxation techniques    Home Living Family/patient expects to be discharged to:: Private residence Living Arrangements: Alone Available Help at Discharge: Family(Son comes by to help out--per pt ) Type of Home: Mobile home Home  Access: Stairs to enter   [employer] of Steps: 3 Home Layout: One level Home Equipment: None      Prior Function Level of Independence: Independent               Hand Dominance   Dominant Hand: Right    Extremity/Trunk Assessment   Upper Extremity Assessment Upper Extremity Assessment: Overall WFL for tasks assessed    Lower Extremity Assessment Lower Extremity Assessment: Overall WFL for tasks assessed    Cervical / Trunk Assessment Cervical / Trunk Assessment: Normal  Communication   Communication: No difficulties  Cognition Arousal/Alertness: Awake/alert Behavior During Therapy: WFL for tasks assessed/performed Overall Cognitive Status: Within Functional Limits for tasks assessed                                        General Comments      Exercises Other Exercises Other Exercises: Transfers: Supine to sit min assist; Transfers: Sit to/from stand: Mod ind; Ambualtion: Gait training 200 ft RW CGA   Assessment/Plan    PT Assessment Patient needs continued PT services  PT Problem List Decreased mobility;Decreased activity tolerance;Pain       PT Treatment Interventions DME instruction;Gait training;Stair training;Functional mobility training;Therapeutic activities;Therapeutic exercise;Balance training;Patient/family education    PT Goals (Current goals can be found in the Care Plan section)  Acute Rehab PT Goals Patient Stated Goal: To get home and be able to visit wife in SNF PT Goal Formulation: With patient Time For Goal Achievement: 11/20/17 Potential to Achieve Goals: Good    Frequency Min 2X/week   Barriers to discharge        Co-evaluation               AM-PAC PT "6 Clicks" Daily Activity  Outcome Measure Difficulty turning over in bed (including adjusting bedclothes, sheets and blankets)?: Unable Difficulty moving from lying on back to sitting on the side of the bed? : Unable Difficulty sitting down  on and standing up from a chair with arms (e.g., wheelchair, bedside commode, etc,.)?: A Little Help needed moving to and from a bed to chair (including a wheelchair)?: A Little Help needed walking in hospital room?: A Little Help needed climbing 3-5 steps with a railing? : A Little 6 Click Score: 14    End of Session Equipment Utilized During Treatment: Gait belt Activity Tolerance: Patient tolerated treatment well Patient left: in chair;with chair alarm set;with call bell/phone within reach Nurse Communication: Mobility status PT Visit Diagnosis: Unsteadiness on feet (R26.81);Other abnormalities of gait and mobility (R26.89);Pain Pain - part of body: (Abdomen)    Time: 1610-9604 PT Time Calculation (min) (ACUTE ONLY): 25 min   Charges:         PT G Codes:        Grayland Jack, SPT 11/06/17,1:37 PM

## 2017-11-06 NOTE — Care Management Important Message (Signed)
Important Message  Patient Details  Name: Santa GeneraBilly J Kattner MRN: [id number] Date of Birth: 08/04/1945   Medicare Important Message Given:  Yes    Olegario MessierKathy A Branna Cortina 11/06/2017, 10:53 AM

## 2017-11-06 NOTE — Progress Notes (Signed)
[facility] - [place] [facility]   PATIENT NAME: Brian Montes    MR#:  [id number]  DATE OF BIRTH:  10-Sep-1945  SUBJECTIVE:  abdominal pain is better but vomited 3 times last night.  Foley  Is placed  For  urine retention  CHIEF COMPLAINT:   Chief Complaint  Patient presents with  . Chest Pain    REVIEW OF SYSTEMS:  Review of Systems  Constitutional: Negative for chills, fever and malaise/fatigue.  HENT: Negative for hearing loss and sore throat.   Eyes: Negative for blurred vision, double vision and photophobia.  Respiratory: Negative for cough, hemoptysis, shortness of breath, wheezing and stridor.   Cardiovascular: Negative for chest pain, palpitations, orthopnea and leg swelling.  Gastrointestinal: Positive for abdominal pain and nausea. Negative for blood in stool, constipation, diarrhea, melena and vomiting.  Genitourinary: Negative for dysuria, flank pain, hematuria and urgency.  Musculoskeletal: Negative for back pain, joint pain, myalgias and neck pain.  Skin: Negative for rash.  Neurological: Negative for dizziness, sensory change, focal weakness, seizures, loss of consciousness, weakness and headaches.  Endo/Heme/Allergies: Negative for polydipsia.  Psychiatric/Behavioral: Negative for depression and memory loss. The patient is not nervous/anxious and does not have insomnia.     DRUG ALLERGIES:   Allergies  Allergen Reactions  . Codeine Other (See Comments)   VITALS:  Blood pressure (!) 156/83, pulse 84, temperature 98.6 F (37 C), temperature source Oral, resp. rate 18, height 5\' 9"  (1.753 m), weight 74.7 kg (164 lb 10.9 oz), SpO2 98 %. PHYSICAL EXAMINATION:  Physical Exam  Constitutional: He is oriented to person, place, and time. He appears well-developed.  HENT:  Head: Normocephalic.  Mouth/Throat: Oropharynx is clear and moist.  Eyes: Pupils are equal, round, and reactive to light. Conjunctivae and EOM are normal. No scleral icterus.    Neck: Normal range of motion. Neck supple. No JVD present. No tracheal deviation present.  Cardiovascular: Normal rate, regular rhythm and normal heart sounds. Exam reveals no gallop.  No murmur heard. Pulmonary/Chest: Effort normal and breath sounds normal. No respiratory distress. He has no wheezes. He has no rales.  Abdominal: He exhibits distension. Bowel sounds are decreased. There is generalized tenderness.  Musculoskeletal: Normal range of motion. He exhibits no edema or tenderness.  Neurological: He is alert and oriented to person, place, and time. No cranial nerve deficit.  Skin: No rash noted. No erythema.  Psychiatric: He has a normal mood and affect.   LABORATORY PANEL:  Male CBC Recent Labs  Lab 11/05/17 0505  WBC 8.7  HGB 9.5*  HCT 27.5*  PLT 243   ------------------------------------------------------------------------------------------------------------------ Chemistries  Recent Labs  Lab 11/05/17 0505  NA 138  K 4.1  CL 112*  CO2 20*  GLUCOSE 89  BUN 21  CREATININE 0.81  CALCIUM 7.4*  AST 29  ALT 27  ALKPHOS 118  BILITOT 1.3*   RADIOLOGY:  Dg Abd 1 View  Result Date: 11/06/2017 CLINICAL DATA:  72 year old male with vomiting and abdominal pain. 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 EXAM: ABDOMEN - 1 VIEW COMPARISON:  Abdominal radiograph 11/05/2017. Preoperative CT Abdomen and Pelvis 10/30/2017 and earlier. FINDINGS: Supine views of the abdomen and pelvis at 0846 hours. Right upper quadrant surgical clips and postoperative drain remain in place. Lower abdominal skin staples redemonstrated. Gas-filled small bowel loops remain mildly to moderately dilated and have increased in number throughout the abdomen, measuring up to 39 millimeters diameter. There is  a small volume of retained oral contrast throughout the large bowel to the rectum. There is increased and moderate gas distention of the  stomach. No pneumoperitoneum is identified on these supine views. Stable visualized osseous structures. The lung bases are not included. IMPRESSION: 1. Increased gas distended stomach and small bowel throughout the abdomen. Favor worsening ileus over mechanical obstruction. 2. No pneumoperitoneum identified on these supine views. 3. Stable right upper quadrant drain. Electronically Signed   By: Odessa FlemingH  Hall M.D.   On: 11/06/2017 09:05   ASSESSMENT AND PLAN:  Postop ileus, patient abdominal pain better but x-ray of abdomen today is worse suggesting worsening postop ileus/mechanical obstruction.   will continue IV fluids, n.p.o., appreciate surgery following the patient.  Patient is able to pass gas and also had BM yesterday.  * Atypical chest pain. Heparin drip and Nitropaste were discontinued per Dr. Herbie BaltimoreHarding, continue  aspirin, statin and metoprolol, Stress test is normal. Echo: LV EF: 55% -   65%.  *Hypertension.  Continue Lopressor and lisinopril and hold chlorthalidone. metoprolol.  *Hyperlipidemia.  On Lipitor.  Acute cholecystitis per CT. status post lap chole by surgery,     Post op urine Retention, inserted Foley, on Flomax ' consult urology..   All the records are reviewed and case discussed with Care Management/Social Worker. Management plans discussed with the patient, family and they are in agreement.  CODE STATUS: Full Code  TOTAL TIME TAKING CARE OF THIS PATIENT: 38 minutes.   More than 50% of the time was spent in counseling/coordination of care: YES  POSSIBLE D/C IN 1-2 DAYS, DEPENDING ON CLINICAL CONDITION.   Katha HammingSnehalatha Adrean Heitz M.D on 11/06/2017 at 11:31 AM  Between 7am to 6pm - Pager - [phone]  After 6pm go to [email or web address] - Therapist, nutritionalpassword [email or web address]  [facility] [place] [facility]

## 2017-11-07 ENCOUNTER — Inpatient Hospital Stay: Payer: Self-pay

## 2017-11-07 ENCOUNTER — Inpatient Hospital Stay: Payer: Medicare Other

## 2017-11-07 LAB — CBC
HCT: 29.7 % — ABNORMAL LOW (ref 40.0–52.0)
Hemoglobin: 10.4 g/dL — ABNORMAL LOW (ref 13.0–18.0)
MCH: 32.6 pg (ref 26.0–34.0)
MCHC: 34.9 g/dL (ref 32.0–36.0)
MCV: 93.4 fL (ref 80.0–100.0)
PLATELETS: 360 10*3/uL (ref 150–440)
RBC: 3.17 MIL/uL — ABNORMAL LOW (ref 4.40–5.90)
RDW: 13.3 % (ref 11.5–14.5)
WBC: 7.3 10*3/uL (ref 3.8–10.6)

## 2017-11-07 LAB — COMPREHENSIVE METABOLIC PANEL
ALBUMIN: 2.5 g/dL — AB (ref 3.5–5.0)
ALK PHOS: 140 U/L — AB (ref 38–126)
ALT: 24 U/L (ref 0–44)
AST: 30 U/L (ref 15–41)
Anion gap: 8 (ref 5–15)
BILIRUBIN TOTAL: 0.9 mg/dL (ref 0.3–1.2)
BUN: 22 mg/dL (ref 8–23)
CALCIUM: 8.2 mg/dL — AB (ref 8.9–10.3)
CO2: 22 mmol/L (ref 22–32)
Chloride: 111 mmol/L (ref 98–111)
Creatinine, Ser: 0.78 mg/dL (ref 0.61–1.24)
GFR calc Af Amer: >60 mL/min (ref >60)
GLUCOSE: 118 mg/dL — AB (ref 70–99)
POTASSIUM: 3.7 mmol/L (ref 3.5–5.1)
Sodium: 141 mmol/L (ref 135–145)
TOTAL PROTEIN: 6 g/dL — AB (ref 6.5–8.1)

## 2017-11-07 LAB — PHOSPHORUS: Phosphorus: 2.9 mg/dL (ref 2.5–4.6)

## 2017-11-07 LAB — MAGNESIUM: MAGNESIUM: 2.1 mg/dL (ref 1.7–2.4)

## 2017-11-07 MED ORDER — FAMOTIDINE IN NACL 20-0.9 MG/50ML-% IV SOLN
20.0000 mg | Freq: Two times a day (BID) | INTRAVENOUS | Status: DC
  Administered 2017-11-07 – 2017-11-13 (×13): 20 mg via INTRAVENOUS
  Filled 2017-11-07 (×12): qty 50

## 2017-11-07 MED ORDER — SODIUM CHLORIDE 0.9 % IV SOLN
INTRAVENOUS | Status: AC
  Administered 2017-11-07: 22:00:00 via INTRAVENOUS

## 2017-11-07 MED ORDER — PROMETHAZINE HCL 25 MG/ML IJ SOLN
12.5000 mg | Freq: Four times a day (QID) | INTRAMUSCULAR | Status: DC | PRN
Start: 2017-11-07 — End: 2017-11-16
  Administered 2017-11-07: 25 mg via INTRAVENOUS
  Administered 2017-11-07: 12.5 mg via INTRAVENOUS
  Administered 2017-11-08: 25 mg via INTRAVENOUS
  Filled 2017-11-07 (×4): qty 1

## 2017-11-07 MED ORDER — IOHEXOL 300 MG/ML  SOLN
100.0000 mL | Freq: Once | INTRAMUSCULAR | Status: AC | PRN
  Administered 2017-11-07: 100 mL via INTRAVENOUS

## 2017-11-07 MED ORDER — FAMOTIDINE IN NACL 20-0.9 MG/50ML-% IV SOLN: 20.0000 mg | Freq: Two times a day (BID) | INTRAVENOUS | Status: DC

## 2017-11-07 MED ORDER — FAT EMULSION PLANT BASED 20 % IV EMUL: 250.0000 mL | INTRAVENOUS | Status: DC

## 2017-11-07 MED ORDER — SODIUM CHLORIDE 0.9% FLUSH: 10.0000 mL | INTRAVENOUS | Status: DC | PRN

## 2017-11-07 MED ORDER — IOPAMIDOL (ISOVUE-300) INJECTION 61%
15.0000 mL | INTRAVENOUS | Status: AC
  Administered 2017-11-07 (×2): 15 mL via ORAL

## 2017-11-07 MED ORDER — SODIUM CHLORIDE 0.9% FLUSH
10.0000 mL | Freq: Two times a day (BID) | INTRAVENOUS | Status: DC
  Administered 2017-11-08 – 2017-11-15 (×9): 10 mL

## 2017-11-07 MED ORDER — FAT EMULSION PLANT BASED 20 % IV EMUL
240.0000 mL | INTRAVENOUS | Status: AC
  Administered 2017-11-07: 240 mL via INTRAVENOUS
  Filled 2017-11-07: qty 240

## 2017-11-07 MED ORDER — TRACE MINERALS CR-CU-MN-SE-ZN 10-1000-500-60 MCG/ML IV SOLN
INTRAVENOUS | Status: AC
  Administered 2017-11-07: 22:00:00 via INTRAVENOUS
  Filled 2017-11-07: qty 960

## 2017-11-07 MED ORDER — ALUM & MAG HYDROXIDE-SIMETH 200-200-20 MG/5ML PO SUSP
15.0000 mL | ORAL | Status: DC | PRN
  Administered 2017-11-07 – 2017-11-13 (×8): 30 mL via ORAL
  Filled 2017-11-07 (×8): qty 30

## 2017-11-07 MED ORDER — M.V.I. ADULT IV INJ: INJECTION | INTRAVENOUS | Status: DC

## 2017-11-07 NOTE — Progress Notes (Signed)
Post op Ileus Still having nausea  Had a Bm AVSS  PE: NAD Abd: distended w decrease bs, soft, incisions c./d/i, no infection  A/p persistent ileus We will check CT A/P to r/o abscess TPN and NPO

## 2017-11-07 NOTE — Progress Notes (Signed)
Peripherally Inserted Central Catheter/Midline Placement  The IV Nurse has discussed with the patient and/or persons authorized to consent for the patient, the purpose of this procedure and the potential benefits and risks involved with this procedure.  The benefits include less needle sticks, lab draws from the catheter, and the patient may be discharged home with the catheter. Risks include, but not limited to, infection, bleeding, blood clot (thrombus formation), and puncture of an artery; nerve damage and irregular heartbeat and possibility to perform a PICC exchange if needed/ordered by physician.  Alternatives to this procedure were also discussed.  Bard Power PICC patient education guide, fact sheet on infection prevention and patient information card has been provided to patient /or left at bedside.    PICC/Midline Placement Documentation        Timmothy Soursewman, Cartha Rotert Renee 11/07/2017, 5:19 PM

## 2017-11-07 NOTE — Progress Notes (Signed)
Pt ambulated in hallway. He walked around the nurses desk x 2 without difficulty.

## 2017-11-07 NOTE — Progress Notes (Addendum)
PHARMACY - ADULT TOTAL PARENTERAL NUTRITION CONSULT NOTE   Pharmacy Consult for TPN and electrolyte replacement Indication: prolonged ileus post cholecystectomy  Patient Measurements: Height: 5\' 9"  (175.3 cm) Weight: 180 lb 9.6 oz (81.9 kg) IBW/kg (Calculated) : 70.7 TPN AdjBW (KG): 84.5 Body mass index is 26.67 kg/m. 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 Now with two episodes of emesis. 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 PICC placement and baseline TPN labs are pending. Pharmacy consulted for TPN order and electrolyte management.   GI: POD 5 (11/02/17) laparoscopic cholecystectomy with gel hand placement, CXR with worsening gas concerning for ileus vs obstruction.  Endo: No noted history of DM Insulin requirements in the past 24 hours:  Lytes: K 3.7, Ca 8.2, albumin 2.5, adjusted Ca 8.4, phos 2.9, Mg 2.1 Renal: SCr 11/05/17 0.81 mg/dL seems stable this admission will follow with BMP in TPN labs Pulm:  Cards: HTN, HLD Hepatobil: Neuro: ID:  TPN Access: planned 11/07/17 TPN start date: planned 11/07/17 Nutritional Goals (per RD recommendation on 11/07/17): KCal: 2064  Protein: 90 grams Fluid: 2040 mL  Goal TPN rate is 75 ml/hr   Current Nutrition: NPO, plan start TPN 11/07/17  Plan:  Start Clinimix E 5/20 at 6240mL/hr. Start date ILE 20% at 20 mL/hr for 12 hours This TPN provides 48 g of protein, 192 g of dextrose, and 48 g of lipids which provides 1324.8 kCals per day, meeting 64% of patient needs Electrolytes in TPN: as provided in E 5/20 Add MVI, trace elements Currently NS at 80 mL/hr, decrease to 40 mL/hr when TPN starts tonight and d/c IVF when TPN reaches goal rate Monitor TPN labs per protocol F/U 11/07/17 afternoon to make sure PICC line is placed and to replete electrolytes at  baseline. Next labs 11/08/17  11/07/17 12:08 baseline electrolytes are acceptable. No electrolyte supplement required today. Will recheck electrolytes tomorrow with AM labs per protocol.   Carola FrostNathan A Markeshia Giebel, Pharm.D., BCPS Clinical Pharmacist 11/07/2017,12:35 PM

## 2017-11-07 NOTE — Progress Notes (Addendum)
Nutrition Follow-up  DOCUMENTATION CODES:   Not applicable  INTERVENTION:  Once PICC placed and confirmed initiate Clinimix E 5/20 at 40 mL/hr + 20% ILE at 20 mL/hr over 12 hours.   After 24 hours if electrolytes, CBGs, and triglycerides are within acceptable range advance to goal TPN regimen of Clinimix E 5/20 at 75 mL/hr + 20% ILE at 20 mL/hr over 12 hours. Provides 2064 kcal, 90 grams of protein, 1800 mL fluid daily (+240 mL from lipids).  Provide adult MVI and trace elements as daily TPN additives.  Once TPN is hung, recommend decreasing normal saline to 40 mL/hr. Once TPN advances to goal rate recommend discontinuing normal saline.  Monitor magnesium, potassium, and phosphorus daily for at least 3 days, MD to replete as needed, as pt is at risk for refeeding syndrome given patient has now been 11 days without adequate nutrition (mainly NPO/clear liquids).  Discontinued po MVI as patient will receive MVI in TPN.  Continue daily weights.  NUTRITION DIAGNOSIS:   Inadequate oral intake related to acute illness as evidenced by NPO status.  Ongoing.  GOAL:   Patient will meet greater than or equal to 90% of their needs  Not progressing at this time.  MONITOR:   Diet advancement, Labs, Weight trends, Skin, I & O's  REASON FOR ASSESSMENT:   NPO/Clear Liquid Diet, Consult New TPN/TNA  ASSESSMENT:   72 y.o. 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   -Patient s/p laparoscopic cholecystectomy with gel hand port on 7/18. Intraoperative findings included severe pericholecystic inflammation with clearly identified cystic duct and thrombosed cystic artery. -Patient then developed abdominal pain, distention, and N/V. He  has developed post-operative ileus. -On 7/22 patient was started on ice chips, sips with mediocations, and lozenges/hard candies. -Abdominal x-ray from this AM found increased gaseous distention of stomach and much of small bowel worrisome for worsening ileus or distal small bowel obstruction. No colonic distention.  Met with patient at bedside. He reports his abdominal pain and distention have worsened overnight. Abdomen very taut and distended today. He reports two episodes of emesis (one yesterday and one early this AM).   IV Access: order in for placement of PICC line  Medications reviewed and include: Dulcolax 10 mg RE daily, Colace, MVI daily, senna, NS @ 80 mL/hr, ceftriaxone, famotidine.  Labs reviewed: Chloride 112, CO2 20.  I/O: 590 mL UOP yesterday (0.3 mL/kg/hr); 200 mL output from JP drain  Weight trend: 81.9 kg on 7/23; +8.8 kg from 7/18  Discussed with RN. Also discussed with Surgeon. Plan is for placement of PICC and initiation of TPN today.  Diet Order:   Diet Order           Diet NPO time specified Except for: Ice Chips, Sips with Meds, Other (See Comments)  Diet effective now        Diet - low sodium heart healthy        Diet - low sodium heart healthy          EDUCATION NEEDS:   Education needs have been addressed  Skin:  Skin Assessment: Skin Integrity Issues: Skin Integrity Issues:: Incisions Incisions: closed incision to abdomen  Last BM:  11/05/2017 - medium type 6  Height:   Ht Readings from Last 1 Encounters:  10/27/17 5' 9" (1.753 m)    Weight:   Wt Readings from Last 1 Encounters:  11/07/17 180 lb 9.6 oz (81.9 kg)    Ideal Body Weight:  72.7 kg  BMI:  Body mass index is 26.67 kg/m.  Estimated Nutritional Needs:   Kcal:  1800-2100kcal/day   Protein:  73-88g/day   Fluid:  >1.8L/day   Brian Blade, MS, RD, LDN Office: [phone] Pager: [phone] After Hours/Weekend Pager: [phone]

## 2017-11-07 NOTE — Progress Notes (Signed)
Physical Therapy Treatment Patient Details Name: Brian Montes MRN: [id number] DOB: 1945-08-09 Today's Date: 11/07/2017    History of Present Illness Brian Montes  is a 72 y.o. male with a known history of HTN, HL, Past tobacco use presents to the emergency room due to left-sided chest pain that has been ongoing intermittently for 1 year and has progressively worsened.   Postoperative status post laparoscopic cholecystectomy for severe acute on chronic cholecystitis    PT Comments    Pt initially refuses PT, but with gentle encouragement, pt agreeable to supine bed exercises. Pt requires cues for slowed technique with several exercises. Mild abdominal pain noted with LE heel slides, but improved with education on proper breathing. Assist given as needed. Continue PT to progress endurance and strength to improve all functional mobility to allow for an optimal, safe return home.    Follow Up Recommendations  Home health PT     Equipment Recommendations       Recommendations for Other Services       Precautions / Restrictions Precautions Precautions: Fall Restrictions Weight Bearing Restrictions: No    Mobility  Bed Mobility               General bed mobility comments: Refused out of bed  Transfers                    Ambulation/Gait                 Stairs             Wheelchair Mobility    Modified Rankin (Stroke Patients Only)       Balance                                            Cognition Arousal/Alertness: Awake/alert Behavior During Therapy: WFL for tasks assessed/performed Overall Cognitive Status: Within Functional Limits for tasks assessed                                        Exercises General Exercises - Lower Extremity Ankle Circles/Pumps: AROM;Both;20 reps;Supine Quad Sets: Strengthening;Both;20 reps;Supine Gluteal Sets: Strengthening;Both;20 reps;Supine(lightly/states hurts  abdomen) Short Arc Quad: AROM;Both;20 reps;Supine Heel Slides: AROM;Both;20 reps;Supine Hip ABduction/ADduction: AAROM;Both;20 reps;Supine Straight Leg Raises: AAROM;Both;10 reps;Supine(2 sets)    General Comments        Pertinent Vitals/Pain Pain Assessment: 0-10 Pain Location: Abdomen Pain Intervention(s): Limited activity within patient's tolerance;Monitored during session;Premedicated before session    Home Living                      Prior Function            PT Goals (current goals can now be found in the care plan section) Progress towards PT goals: Progressing toward goals    Frequency    Min 2X/week      PT Plan Current plan remains appropriate    Co-evaluation              AM-PAC PT "6 Clicks" Daily Activity  Outcome Measure  Difficulty turning over in bed (including adjusting bedclothes, sheets and blankets)?: A Little Difficulty moving from lying on back to sitting on the side of the bed? : Unable Difficulty sitting down on and standing up  from a chair with arms (e.g., wheelchair, bedside commode, etc,.)?: Unable Help needed moving to and from a bed to chair (including a wheelchair)?: None Help needed walking in hospital room?: None Help needed climbing 3-5 steps with a railing? : None 6 Click Score: 17    End of Session   Activity Tolerance: Patient tolerated treatment well Patient left: in bed;with call bell/phone within reach;with bed alarm set;with SCD's reapplied   PT Visit Diagnosis: Unsteadiness on feet (R26.81);Other abnormalities of gait and mobility (R26.89);Pain Pain - part of body: (abdomen)     Time: [id number] PT Time Calculation (min) (ACUTE ONLY): 28 min  Charges:  $Therapeutic Exercise: 23-37 mins                    G Codes:        Scot DockHeidi E Barnes, PTA 11/07/2017, 4:25 PM

## 2017-11-07 NOTE — Progress Notes (Addendum)
[employer] [facility] - [place] at [facility]   PATIENT NAME: Brian Montes    MR#:  [id number]  DATE OF BIRTH:  [birth year]  abdominal pain is worse and also has vomiting.  CHIEF COMPLAINT:   Chief Complaint  Patient presents with  . Chest Pain   c/o of worsening abdominal pain nausea vomiting.  Burning in stomach.  Passing flattus. REVIEW OF SYSTEMS:  Review of Systems  Constitutional: Negative for chills, fever and malaise/fatigue.  HENT: Negative for hearing loss and sore throat.   Eyes: Negative for blurred vision, double vision and photophobia.  Respiratory: Negative for cough, hemoptysis, shortness of breath, wheezing and stridor.   Cardiovascular: Negative for chest pain, palpitations, orthopnea and leg swelling.  Gastrointestinal: Positive for abdominal pain, nausea and vomiting. Negative for blood in stool, constipation, diarrhea and melena.  Genitourinary: Negative for dysuria, flank pain, hematuria and urgency.  Musculoskeletal: Negative for back pain, joint pain, myalgias and neck pain.  Skin: Negative for rash.  Neurological: Negative for dizziness, sensory change, focal weakness, seizures, loss of consciousness, weakness and headaches.  Endo/Heme/Allergies: Negative for polydipsia.  Psychiatric/Behavioral: Negative for depression and memory loss. The patient is not nervous/anxious and does not have insomnia.     DRUG ALLERGIES:   Allergies  Allergen Reactions  . Codeine Other (See Comments)   VITALS:  Blood pressure (!) 150/79, pulse 74, temperature 97.6 F (36.4 C), temperature source Oral, resp. rate 18, height 5\' 9"  (1.753 m), weight 81.9 kg (180 lb 9.6 oz), SpO2 95 %. PHYSICAL EXAMINATION:  Physical Exam  Constitutional: He is oriented to person, place, and time. He appears well-developed.  HENT:  Head: Normocephalic.  Mouth/Throat: Oropharynx is clear and moist.  Eyes: Pupils are equal, round, and reactive to light. Conjunctivae and EOM are  normal. No scleral icterus.  Neck: Normal range of motion. Neck supple. No JVD present. No tracheal deviation present.  Cardiovascular: Normal rate, regular rhythm and normal heart sounds. Exam reveals no gallop.  No murmur heard. Pulmonary/Chest: Effort normal and breath sounds normal. No respiratory distress. He has no wheezes. He has no rales.  Abdominal: He exhibits distension. Bowel sounds are decreased. There is generalized tenderness.  Musculoskeletal: Normal range of motion. He exhibits no edema or tenderness.  Neurological: He is alert and oriented to person, place, and time. No cranial nerve deficit.  Skin: No rash noted. No erythema.  Psychiatric: He has a normal mood and affect.   LABORATORY PANEL:  Male CBC Recent Labs  Lab 11/05/17 0505  WBC 8.7  HGB 9.5*  HCT 27.5*  PLT 243   ------------------------------------------------------------------------------------------------------------------ Chemistries  Recent Labs  Lab 11/05/17 0505  NA 138  K 4.1  CL 112*  CO2 20*  GLUCOSE 89  BUN 21  CREATININE 0.81  CALCIUM 7.4*  AST 29  ALT 27  ALKPHOS 118  BILITOT 1.3*   RADIOLOGY:  Dg Abd 1 View  Result Date: 11/06/2017 CLINICAL DATA:  72 year old male with vomiting and abdominal pain. 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 EXAM: ABDOMEN - 1 VIEW COMPARISON:  Abdominal radiograph 11/05/2017. Preoperative CT Abdomen and Pelvis 10/30/2017 and earlier. FINDINGS: Supine views of the abdomen and pelvis at 0846 hours. Right upper quadrant surgical clips and postoperative drain remain in place. Lower abdominal skin staples redemonstrated. Gas-filled small bowel loops remain mildly to moderately dilated and have increased in number throughout the abdomen, measuring up to 39 millimeters diameter. There is a small  volume of retained oral contrast throughout the large bowel to the rectum. There is increased and  moderate gas distention of the stomach. No pneumoperitoneum is identified on these supine views. Stable visualized osseous structures. The lung bases are not included. IMPRESSION: 1. Increased gas distended stomach and small bowel throughout the abdomen. Favor worsening ileus over mechanical obstruction. 2. No pneumoperitoneum identified on these supine views. 3. Stable right upper quadrant drain. Electronically Signed   By: Odessa FlemingH  Hall M.D.   On: 11/06/2017 09:05   ASSESSMENT AND PLAN:  Postop ileus, ; more distention, more abdominal pain and also has vomiting now.  Continue IV fluids, n.p.o., appreciate surgery recommending whether he needs repeat CAT scan of abdomen/NG tube. IV fluids, n.p.o., add IV PPI, IV Zofran  * Atypical chest pain. Heparin drip and Nitropaste were discontinued per Dr. Herbie BaltimoreHarding, , Stress test is normal. Echo: LV EF: 55% -   65%. And is n.p.o. except medicines, continue aspirin, statins, beta-blockers.   *Hypertension.    Continue oral antihypertensives for.   *Hyperlipidemia.  Lipitor..  Acute cholecystitis per CT. status post lap chole by surgery,     Post op urine Retention, inserted Foley, on Flomax ' .Marland Kitchen.   History of asthma: Continue bronchodilators as needed.   no wheezing today.  All the records are reviewed and case discussed with Care Management/Social Worker. Management plans discussed with the patient, family and they are in agreement.  CODE STATUS: Full Code  TOTAL TIME TAKING CARE OF THIS PATIENT: 38 minutes.   More than 50% of the time was spent in counseling/coordination of care: YES  POSSIBLE D/C IN 1-2 DAYS, DEPENDING ON CLINICAL CONDITION.   Katha HammingSnehalatha Halil Rentz M.D on 11/07/2017 at 8:24 AM  Between 7am to 6pm - Pager - [phone]  After 6pm go to [email or web address] - Therapist, nutritionalpassword EPAS [facility]  [facility] [place] [facility]

## 2017-11-07 NOTE — Progress Notes (Signed)
Xray abdomen this morning showed worsening ileus/ SBO: Continue n.p.o., IV fluids, appreciate surgery following the patient.  Stat labs are ordered with CBC, BMP.;

## 2017-11-07 NOTE — Telephone Encounter (Signed)
The patient is still currently admitted.   

## 2017-11-07 NOTE — Progress Notes (Addendum)
Pt's urinary output has been 50ml in ~3 hours. Dr. Luberta MutterKonidena notified and she asked that pt's foley be flushed. Will flush foley.  Update 1308: Foley flushed and bladder scan performed. Bladder scan showed 513mls in bladder. Dr. Luberta MutterKonidena notified and she stated to remove and replace the foley.   Update 1415: Pt;s new foley has collected ~7650mls of urine since it was placed. Dr. Luberta MutterKonidena notified and asked if IVF rate should be increased. She stated that it would not need to be increased at this time. No new orders received.

## 2017-11-08 ENCOUNTER — Inpatient Hospital Stay: Payer: Medicare Other

## 2017-11-08 LAB — BASIC METABOLIC PANEL
ANION GAP: 5 (ref 5–15)
BUN: 22 mg/dL (ref 8–23)
CHLORIDE: 108 mmol/L (ref 98–111)
CO2: 26 mmol/L (ref 22–32)
Calcium: 8 mg/dL — ABNORMAL LOW (ref 8.9–10.3)
Creatinine, Ser: 0.61 mg/dL (ref 0.61–1.24)
GFR calc Af Amer: >60 mL/min (ref >60)
Glucose, Bld: 140 mg/dL — ABNORMAL HIGH (ref 70–99)
POTASSIUM: 3.2 mmol/L — AB (ref 3.5–5.1)
SODIUM: 139 mmol/L (ref 135–145)

## 2017-11-08 LAB — GLUCOSE, CAPILLARY
GLUCOSE-CAPILLARY: 135 mg/dL — AB (ref 70–99)
Glucose-Capillary: 127 mg/dL — ABNORMAL HIGH (ref 70–99)

## 2017-11-08 LAB — PHOSPHORUS: PHOSPHORUS: 2.5 mg/dL (ref 2.5–4.6)

## 2017-11-08 LAB — TRIGLYCERIDES: Triglycerides: 140 mg/dL (ref <150)

## 2017-11-08 LAB — MAGNESIUM: MAGNESIUM: 1.9 mg/dL (ref 1.7–2.4)

## 2017-11-08 MED ORDER — FUROSEMIDE 10 MG/ML IJ SOLN
20.0000 mg | Freq: Once | INTRAMUSCULAR | Status: AC
  Administered 2017-11-08: 20 mg via INTRAVENOUS
  Filled 2017-11-08: qty 4

## 2017-11-08 MED ORDER — M.V.I. ADULT IV INJ
INJECTION | INTRAVENOUS | Status: AC
  Administered 2017-11-08: 23:00:00 via INTRAVENOUS
  Filled 2017-11-08: qty 1800

## 2017-11-08 MED ORDER — POTASSIUM CHLORIDE 10 MEQ/100ML IV SOLN
10.0000 meq | INTRAVENOUS | Status: AC
Start: 2017-11-08 — End: 2017-11-08
  Administered 2017-11-08 (×4): 10 meq via INTRAVENOUS
  Filled 2017-11-08 (×5): qty 100

## 2017-11-08 MED ORDER — FAT EMULSION PLANT BASED 20 % IV EMUL
240.0000 mL | INTRAVENOUS | Status: AC
  Administered 2017-11-08: 240 mL via INTRAVENOUS
  Filled 2017-11-08: qty 240

## 2017-11-08 MED ORDER — INSULIN ASPART 100 UNIT/ML ~~LOC~~ SOLN
0.0000 [IU] | Freq: Four times a day (QID) | SUBCUTANEOUS | Status: DC
  Administered 2017-11-08 – 2017-11-15 (×9): 1 [IU] via SUBCUTANEOUS
  Filled 2017-11-08 (×8): qty 1

## 2017-11-08 NOTE — Care Management Important Message (Signed)
Important Message  Patient Details  Name: Brian Montes MRN: [id number] Date of Birth: 04/11/1946   Medicare Important Message Given:  Yes    Olegario MessierKathy A Dorthy Hustead 11/08/2017, 10:58 AM

## 2017-11-08 NOTE — Progress Notes (Signed)
POD # 6 Ileus No Flatus CT reviewed, c/w ileus  PE NAD Abd: decrease bs, soft but distended, no peritonitis, wounds healing well, no infection  A/ p Persistent ileus Recheck KUB may need NGT Continue NPO and TPN

## 2017-11-08 NOTE — Progress Notes (Signed)
Spoke with Dr. Maia Planintron regarding patient continuing to have pain in stomach and nausea/vomiting.  Spoke with him regarding most recent xray results and previous note of Dr. Hurman HornPabon's.   Per Dr. Maia Planintron, place NG tube at this time.  Will continue to  Monitor at this time.

## 2017-11-08 NOTE — Progress Notes (Signed)
PHARMACY - ADULT TOTAL PARENTERAL NUTRITION CONSULT NOTE   Pharmacy Consult for TPN and electrolyte replacement Indication: prolonged ileus post cholecystectomy  Patient Measurements: Height: 5\' 9"  (175.3 cm) Weight: 179 lb 3.2 oz (81.3 kg) IBW/kg (Calculated) : 70.7 TPN AdjBW (KG): 84.5 Body mass index is 26.46 kg/m. 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 Now with two episodes of emesis. 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 PICC placement and baseline TPN labs are pending. Pharmacy consulted for TPN order and electrolyte management.   GI: POD 6 (11/02/17) laparoscopic cholecystectomy with gel hand placement, CXR with worsening gas concerning for ileus vs obstruction. Started TPN yesterday.  Endo: No noted history of DM Insulin requirements in the past 24 hours:  Lytes: K 3.2, Ca 8, albumin 2.5, adjusted Ca 9.2, phos 2.5, Mg 1.9 Renal: SCr 11/05/17 0.81 mg/dL seems stable this admission will follow with BMP in TPN labs. 7/24 SCr 0.61 mg/dL Pulm:  Cards: HTN, HLD Hepatobil: Neuro: ID:  TPN Access: DL PICC placed 1/61/097/23/19 TPN start date: 11/07/17 first admin at 22:00  Nutritional Goals (per RD recommendation on 11/07/17): KCal: 2064  Protein: 90 grams Fluid: 2040 mL  Goal TPN rate is 75 ml/hr   Current Nutrition: NPO, plan start TPN 11/07/17  Plan:  Increase Clinimix E 5/20 to goal 75 mL/hr. Continue date ILE 20% at 20 mL/hr for 12 hours This TPN provides 90 g of protein, 360 g of dextrose, and 48 g of lipids which provides 2064 kCals per day, meeting approximately 90% of patient needs Electrolytes in TPN: as provided in E 5/20 Add MVI, trace elements Add SSI sensitive Q6H per protocol - no history of DM Currently NS at 40 mL/hr, discontinue when TPN starts tonight Monitor TPN labs per  protocol F/U 11/09/17 electrolytes  11/07/17 12:08 baseline electrolytes are acceptable. No electrolyte supplement required today. Will recheck electrolytes tomorrow with AM labs per protocol.   11/08/17 04:43 hypokalemia noted. IV KCl 10 mEq Q1H x 4 doses given this morning. Will recheck electrolytes tomorrow with AM labs.   Brian Montes, Pharm.D., BCPS Clinical Pharmacist 11/08/2017,11:30 AM

## 2017-11-08 NOTE — Progress Notes (Signed)
[employer] [facility] - [place] at [facility]   PATIENT NAME: Brian Montes    MR#:  [id number]  DATE OF BIRTH:  1945/09/09  Still has abdominal pain, distention, nausea.  Started on TPN yesterday.  CHIEF COMPLAINT:   Chief Complaint  Patient presents with  . Chest Pain   Abdominal pain, nausea. REVIEW OF SYSTEMS:  Review of Systems  Constitutional: Negative for chills, fever and malaise/fatigue.  HENT: Negative for hearing loss and sore throat.   Eyes: Negative for blurred vision, double vision and photophobia.  Respiratory: Negative for cough, hemoptysis, shortness of breath, wheezing and stridor.   Cardiovascular: Negative for chest pain, palpitations, orthopnea and leg swelling.  Gastrointestinal: Positive for abdominal pain, nausea and vomiting. Negative for blood in stool, constipation, diarrhea and melena.  Genitourinary: Negative for dysuria, flank pain, hematuria and urgency.  Musculoskeletal: Negative for back pain, joint pain, myalgias and neck pain.  Skin: Negative for rash.  Neurological: Negative for dizziness, sensory change, focal weakness, seizures, loss of consciousness, weakness and headaches.  Endo/Heme/Allergies: Negative for polydipsia.  Psychiatric/Behavioral: Negative for depression and memory loss. The patient is not nervous/anxious and does not have insomnia.     DRUG ALLERGIES:   Allergies  Allergen Reactions  . Codeine Other (See Comments)   VITALS:  Blood pressure 128/65, pulse 75, temperature 98.9 F (37.2 C), temperature source Oral, resp. rate 15, height 5\' 9"  (1.753 m), weight 81.3 kg (179 lb 3.2 oz), SpO2 94 %. PHYSICAL EXAMINATION:  Physical Exam  Constitutional: He is oriented to person, place, and time. He appears well-developed.  HENT:  Head: Normocephalic.  Mouth/Throat: Oropharynx is clear and moist.  Eyes: Pupils are equal, round, and reactive to light. Conjunctivae and EOM are normal. No scleral icterus.  Neck: Normal  range of motion. Neck supple. No JVD present. No tracheal deviation present.  Cardiovascular: Normal rate, regular rhythm and normal heart sounds. Exam reveals no gallop.  No murmur heard. Pulmonary/Chest: Effort normal and breath sounds normal. No respiratory distress. He has no wheezes. He has no rales.  Abdominal: He exhibits distension. Bowel sounds are decreased. There is generalized tenderness.  Musculoskeletal: Normal range of motion. He exhibits no edema or tenderness.  Neurological: He is alert and oriented to person, place, and time. No cranial nerve deficit.  Skin: No rash noted. No erythema.  Psychiatric: He has a normal mood and affect.   LABORATORY PANEL:  Male CBC Recent Labs  Lab 11/07/17 1208  WBC 7.3  HGB 10.4*  HCT 29.7*  PLT 360   ------------------------------------------------------------------------------------------------------------------ Chemistries  Recent Labs  Lab 11/07/17 1208 11/08/17 0443  NA 141 139  K 3.7 3.2*  CL 111 108  CO2 22 26  GLUCOSE 118* 140*  BUN 22 22  CREATININE 0.78 0.61  CALCIUM 8.2* 8.0*  MG 2.1 1.9  AST 30  --   ALT 24  --   ALKPHOS 140*  --   BILITOT 0.9  --    RADIOLOGY:  Ct Abdomen Pelvis W Contrast  Result Date: 11/07/2017 CLINICAL DATA:  Postoperative ileus with nausea, initial encounter EXAM: CT ABDOMEN AND PELVIS WITH CONTRAST TECHNIQUE: Multidetector CT imaging of the abdomen and pelvis was performed using the standard protocol following bolus administration of intravenous contrast. CONTRAST:  OMNIPAQUE IOHEXOL 300 MG/ML  SOLN COMPARISON:  Plain film from earlier in the same day. FINDINGS: Lower chest: Lung bases demonstrate bilateral pleural effusions with associated atelectasis slightly greater on the right than the left. Hepatobiliary:  The gallbladder has been surgically removed. A surgical drain is noted in the gallbladder fossa. The liver is within normal limits. Pancreas: Unremarkable. No pancreatic  ductal dilatation or surrounding inflammatory changes. Spleen: Normal in size without focal abnormality. Adrenals/Urinary Tract: Adrenal glands are within normal limits. Kidneys are well visualized bilaterally with stable cystic changes similar to that seen on the prior exam. The bladder is decompressed by Foley catheter. Stomach/Bowel: Colon is decompressed with some scattered contrast material within. Changes of prior right partial colectomy are noted with patent small-bowel to ascending colon anastomosis. There is considerable dilatation of the proximal small bowel extending to the mid ileum. The distal ileum is decompressed. No definitive transition zone or mass lesion is noted. These changes could be related to adhesions from prior right colectomy or generalized small-bowel postoperative ileus. Vascular/Lymphatic: No significant vascular findings are present. No enlarged abdominal or pelvic lymph nodes. Reproductive: Prostate is unremarkable. Other: Mild free fluid is noted within the pelvis. This may simply be related to the recent surgical intervention. Some mild fluid is noted in the region of the gallbladder fossa also likely related to the recent surgery. Musculoskeletal: No acute bony abnormality is noted. IMPRESSION: Status post cholecystectomy with surgical drain in the gallbladder fossa. Diffuse small bowel dilatation extending to the mid to distal ileum. No definitive transition zone is seen although some bowel loops are noted adjacent to the anterior abdominal wall and some adhesions could be present. This may represent a diffuse small bowel postoperative ileus. Correlation with the physical exam is recommended. Mild ascites is noted predominately within the pelvis. This may simply be related to the prior surgery. No definitive abscess is seen. Bilateral pleural effusions with bibasilar atelectasis right greater than left. Electronically Signed   By: Alcide CleverMark  Lukens M.D.   On: 11/07/2017 15:44   Dg  Abd Acute W/chest  Result Date: 11/08/2017 CLINICAL DATA:  Ileus following gastrointestinal surgery. EXAM: DG ABDOMEN ACUTE W/ 1V CHEST COMPARISON:  CT of the abdomen pelvis 11/07/2017 and abdominal radiograph 11/07/2017 FINDINGS: Surgical drain is present in the right upper abdomen. Gaseous distension of the stomach. There continues to be dilated gas-filled loops of small bowel throughout the abdomen. Again noted is high-density contrast in the colon but this may be from contrast administrated on 10/30/2017. Right arm PICC line tip is in the SVC. Increased fullness along the right side of the mediastinum and probably related to volume loss. Right basilar chest densities are suggestive for atelectasis and right pleural fluid. No evidence for free air. IMPRESSION: No change in the dilated loops of small bowel and gaseous distension of the stomach. Differential diagnosis includes a postoperative ileus versus distal small bowel obstruction. Densities in the right lower chest are most compatible with pleural fluid and atelectasis. Electronically Signed   By: Richarda OverlieAdam  Henn M.D.   On: 11/08/2017 14:28   [place] Ekg Site Rite  Result Date: 11/07/2017 If Site Rite image not attached, placement could not be confirmed due to current cardiac rhythm.  ASSESSMENT AND PLAN:  Postop ileus, ; more distention, more abdominal pain and also has vomiting now.  Continue n.p.o., started on TPN, surgery is following, patient may need NG tube to take decompress due to his persistent ileus   * Atypical chest pain. Heparin drip and Nitropaste were discontinued per Dr. Herbie BaltimoreHarding, , Stress test is normal. Echo: LV EF: 55% -   65%. And is n.p.o. except medicines, continue aspirin, statins, beta-blockers.   *Hypertension.    Continue oral  antihypertensives for.   *Hyperlipidemia.  Lipitor..  Acute cholecystitis per CT. status post lap chole by surgery,     Post op urine Retention, inserted Foley, on Flomax ' .Marland Kitchen   History of  asthma: Continue bronchodilators as needed.   no wheezing today.  All the records are reviewed and case discussed with Care Management/Social Worker. Management plans discussed with the patient, family and they are in agreement.  CODE STATUS: Full Code  TOTAL TIME TAKING CARE OF THIS PATIENT: 38 minutes.   More than 50% of the time was spent in counseling/coordination of care: YES  POSSIBLE D/C IN 1-2 DAYS, DEPENDING ON CLINICAL CONDITION.   Katha Hamming M.D on 11/08/2017 at 2:50 PM  Between 7am to 6pm - Pager - [phone]  After 6pm go to [email or web address] - Therapist, nutritional Hospitalists

## 2017-11-09 LAB — GLUCOSE, CAPILLARY
GLUCOSE-CAPILLARY: 120 mg/dL — AB (ref 70–99)
GLUCOSE-CAPILLARY: 108 mg/dL — AB (ref 70–99)
Glucose-Capillary: 119 mg/dL — ABNORMAL HIGH (ref 70–99)
Glucose-Capillary: 122 mg/dL — ABNORMAL HIGH (ref 70–99)
Glucose-Capillary: 130 mg/dL — ABNORMAL HIGH (ref 70–99)

## 2017-11-09 LAB — BASIC METABOLIC PANEL
ANION GAP: 4 — AB (ref 5–15)
BUN: 20 mg/dL (ref 8–23)
CO2: 28 mmol/L (ref 22–32)
Calcium: 8 mg/dL — ABNORMAL LOW (ref 8.9–10.3)
Chloride: 109 mmol/L (ref 98–111)
Creatinine, Ser: 0.6 mg/dL — ABNORMAL LOW (ref 0.61–1.24)
Glucose, Bld: 144 mg/dL — ABNORMAL HIGH (ref 70–99)
POTASSIUM: 3.2 mmol/L — AB (ref 3.5–5.1)
SODIUM: 141 mmol/L (ref 135–145)

## 2017-11-09 LAB — HIV ANTIBODY (ROUTINE TESTING W REFLEX): HIV Screen 4th Generation wRfx: NONREACTIVE

## 2017-11-09 LAB — PHOSPHORUS: PHOSPHORUS: 2.7 mg/dL (ref 2.5–4.6)

## 2017-11-09 LAB — MAGNESIUM: MAGNESIUM: 1.9 mg/dL (ref 1.7–2.4)

## 2017-11-09 MED ORDER — POTASSIUM CHLORIDE 10 MEQ/100ML IV SOLN
10.0000 meq | INTRAVENOUS | Status: AC
  Administered 2017-11-09 (×3): 10 meq via INTRAVENOUS
  Filled 2017-11-09 (×3): qty 100

## 2017-11-09 MED ORDER — FAT EMULSION PLANT BASED 20 % IV EMUL
240.0000 mL | INTRAVENOUS | Status: AC
  Administered 2017-11-09: 240 mL via INTRAVENOUS
  Filled 2017-11-09: qty 240

## 2017-11-09 MED ORDER — METOPROLOL TARTRATE 5 MG/5ML IV SOLN
5.0000 mg | INTRAVENOUS | Status: DC | PRN
  Administered 2017-11-09: 5 mg via INTRAVENOUS
  Filled 2017-11-09: qty 5

## 2017-11-09 MED ORDER — MAGNESIUM SULFATE 2 GM/50ML IV SOLN
2.0000 g | Freq: Once | INTRAVENOUS | Status: AC
  Administered 2017-11-09: 2 g via INTRAVENOUS
  Filled 2017-11-09: qty 50

## 2017-11-09 MED ORDER — POTASSIUM CHLORIDE 10 MEQ/100ML IV SOLN
10.0000 meq | INTRAVENOUS | Status: DC
  Administered 2017-11-09 (×2): 10 meq via INTRAVENOUS
  Filled 2017-11-09: qty 100

## 2017-11-09 MED ORDER — POTASSIUM CHLORIDE 10 MEQ/100ML IV SOLN: 10.0000 meq | INTRAVENOUS | Status: DC

## 2017-11-09 MED ORDER — TRACE MINERALS CR-CU-MN-SE-ZN 10-1000-500-60 MCG/ML IV SOLN
INTRAVENOUS | Status: AC
  Administered 2017-11-09: 19:00:00 via INTRAVENOUS
  Filled 2017-11-09: qty 1800

## 2017-11-09 NOTE — Progress Notes (Signed)
Pt's JP drain has drained 390 cc of serosanguinous fluid since 7 am. Dr. Everlene FarrierPabon notified- no orders received. JP drain dressing changed at 9am per order.  PuhiHudson, Latricia HeftKorie G

## 2017-11-09 NOTE — Progress Notes (Signed)
Physical Therapy Treatment Patient Details Name: Brian Montes MRN: [id number] DOB: 07/23/1945 Today's Date: 11/09/2017    History of Present Illness [name]  is a 72 y.o. male with a known history of HTN, HL, Past tobacco use presents to the emergency room due to left-sided chest pain that has been ongoing intermittently for 1 year and has progressively worsened.   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 On PT arrival pt reported low levels of pain and was eager to get up and move. Pt had soiled linens, and was able to roll in bed mod ind for hygiene and linen change. Pt needed min assist to go supine to sit due to pain in abdomen (NPS 3/10). Pt ambulated 225 feet with RW and CGA from therapist for safety. Pt showed good activity tolerance, and reported no change in symptoms in abdomen while ambulating. Pt returned to recliner and expressed feeling relief in abdomen after sitting up and moving. Pt was encouraged to get up with nursing more often to see if he receives continued relief in abdomen from activity. Nursing was advised of pt's ability to ambulate w/RW CGA. Would benefit from skilled PT to address above deficits and promote optimal return to PLOF. Recommend transition to HHPT upon discharge from acute hospitalization.  Follow Up Recommendations  Home health PT     Equipment Recommendations  Rolling walker with 5" wheels    Recommendations for Other Services       Precautions / Restrictions Precautions Precautions: Fall Restrictions Weight Bearing Restrictions: No    Mobility  Bed Mobility Overal bed mobility: Needs Assistance Bed Mobility: Supine to Sit     Supine to sit: Min assist        Transfers Overall transfer level: Modified independent Equipment used: Rolling walker (2 wheeled)             General transfer comment: Pt able  to stand with RW however stuggles and requires increased time  Ambulation/Gait Ambulation/Gait assistance: Min guard Gait Distance (Feet): 225 Feet Assistive device: Rolling walker (2 wheeled) Gait Pattern/deviations: WFL(Within Functional Limits) Gait velocity: normal        Stairs             Wheelchair Mobility    Modified Rankin (Stroke Patients Only)       Balance Overall balance assessment: Modified Independent Sitting-balance support: Feet supported Sitting balance-Leahy Scale: Good     Standing balance support: No upper extremity supported Standing balance-Leahy Scale: Good Standing balance comment: Pt is stable and balanced with RW during gait and weight shifting. Also pt is able to stand static w/o BUE support                            Cognition Arousal/Alertness: Awake/alert Behavior During Therapy: WFL for tasks assessed/performed Overall Cognitive Status: Within Functional Limits for tasks assessed                                        Exercises Other Exercises Other Exercises: Transfers: Supine to sit min assist; Transfers: Sit to/from stand: Mod ind; Ambualtion: Gait training 250 ft RW CGA Other Exercises: Pt able to roll both directions mod ind during hygeine care and linen change    General  Comments        Pertinent Vitals/Pain Pain Assessment: 0-10 Pain Score: 3  Pain Location: Abdomen Pain Descriptors / Indicators: Pressure Pain Intervention(s): Limited activity within patient's tolerance;Monitored during session;Repositioned;Utilized relaxation techniques    Home Living                      Prior Function            PT Goals (current goals can now be found in the care plan section) Acute Rehab PT Goals Patient Stated Goal: To get home and be able to visit wife in SNF PT Goal Formulation: With patient Time For Goal Achievement: 11/20/17 Potential to Achieve Goals: Good Progress towards PT  goals: Progressing toward goals    Frequency    Min 2X/week      PT Plan Current plan remains appropriate    Co-evaluation              AM-PAC PT "6 Clicks" Daily Activity  Outcome Measure  Difficulty turning over in bed (including adjusting bedclothes, sheets and blankets)?: A Little Difficulty moving from lying on back to sitting on the side of the bed? : Unable Difficulty sitting down on and standing up from a chair with arms (e.g., wheelchair, bedside commode, etc,.)?: A Lot Help needed moving to and from a bed to chair (including a wheelchair)?: A Little Help needed walking in hospital room?: A Little Help needed climbing 3-5 steps with a railing? : A Little 6 Click Score: 15    End of Session Equipment Utilized During Treatment: Gait belt Activity Tolerance: Patient tolerated treatment well Patient left: in chair;with chair alarm set;with call bell/phone within reach   PT Visit Diagnosis: Unsteadiness on feet (R26.81);Other abnormalities of gait and mobility (R26.89);Pain Pain - part of body: (abdomen)     Time: 1010-1045 PT Time Calculation (min) (ACUTE ONLY): 35 min  Charges:                       Grayland Jack, SPT 11/09/17,12:16 PM

## 2017-11-09 NOTE — Progress Notes (Signed)
Dr. Everlene FarrierPabon notified that RN last night was unable to get NGT inserted, and pt refused for staff today to re-attempt unless he "was asleep." Will have supplies at bedside for MD per his request.   Lowella DellHudson, Katlin Bortner G

## 2017-11-09 NOTE — Progress Notes (Signed)
Attempted to place NG tube, but pt unable to tolerate at this time.  Pt was given zofran and asked this RN to give him some time before attempted again.  Will continue to monitor.

## 2017-11-09 NOTE — Progress Notes (Signed)
PHARMACY - ADULT TOTAL PARENTERAL NUTRITION CONSULT NOTE   Pharmacy Consult for TPN and electrolyte replacement Indication: prolonged ileus post cholecystectomy  Patient Measurements: Height: 5\' 9"  (175.3 cm) Weight: 185 lb 9.6 oz (84.2 kg) IBW/kg (Calculated) : 70.7 TPN AdjBW (KG): 84.5 Body mass index is 27.41 kg/m. 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 Now with two episodes of emesis. 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 PICC placement and baseline TPN labs are pending. Pharmacy consulted for TPN order and electrolyte management.   7/24 NG tube was ordered but patient was unable to tolerate. Hopefully he'll allow additional attempt this morning.   GI: POD 7 (11/02/17) laparoscopic cholecystectomy with gel hand placement, CXR with worsening gas concerning for ileus vs obstruction. Started TPN yesterday.  Endo: No noted history of DM, SSI Q6H ordered Insulin requirements in the past 24 hours:  Lytes: K 3.2, Ca 8, albumin 2.5, adjusted Ca 9.2, phos 2.7, Mg 1.9 Renal: SCr 11/05/17 0.81 mg/dL seems stable this admission will follow with BMP in TPN labs. 7/24 SCr 0.61 mg/dL 1/617/25 SCr 0.6 mg/dL Pulm:  Cards: HTN, HLD Hepatobil: Neuro: ID:  TPN Access: DL PICC placed 0/96/047/23/19 TPN start date: 11/07/17 first admin at 22:00  Nutritional Goals (per RD recommendation on 11/07/17): KCal: 2064  Protein: 90 grams Fluid: 2040 mL  Goal TPN rate is 75 ml/hr   Current Nutrition: NPO, TPN at goal  Plan:  Continue Clinimix E 5/20 to goal 75 mL/hr. Continue date ILE 20% at 20 mL/hr for 12 hours This TPN provides 90 g of protein, 360 g of dextrose, and 48 g of lipids which provides 2064 kCals per day, meeting approximately 90% of patient needs Electrolytes in TPN: as provided in E 5/20 Add MVI, trace  elements Add SSI sensitive Q6H per protocol - no history of DM IVF was stopped yesterday d/t TPN advanced to goal Monitor TPN labs per protocol F/U 11/10/17 electrolytes  11/07/17 12:08 baseline electrolytes are acceptable. No electrolyte supplement required today. Will recheck electrolytes tomorrow with AM labs per protocol.   11/08/17 04:43 hypokalemia noted. IV KCl 10 mEq Q1H x 4 doses given this morning. Will recheck electrolytes tomorrow with AM labs.   11/09/17 05:04 hypokalemia again today. KCL IV 10 mEq Q1h x 5 doses. Other electrolytes stable. TPN was advanced to goal yesterday. Plan to continue as ordered and check electrolytes per protocol.   Brian FrostNathan A Caysie Montes, Pharm.D., BCPS Clinical Pharmacist 11/09/2017,7:18 AM

## 2017-11-09 NOTE — Progress Notes (Signed)
[employer] [facility] - [place] at [facility]   PATIENT NAME: Brian Montes    MR#:  [id number]  DATE OF BIRTH:  Mar 24, 1946  NG tube insertion was not successful last night, patient could not tolerate the procedure.  But he is willing to try again this morning.  CHIEF COMPLAINT:   Chief Complaint  Patient presents with  . Chest Pain   Abdominal pain, nausea..  Patient is n.p.o. except meds but even for meds patient having nausea when he has to drink some water.  Changed beta-blocker to IV beta-blocker.  Other medicines can be held at this time.  Due to nausea. REVIEW OF SYSTEMS:  Review of Systems  Constitutional: Negative for chills, fever and malaise/fatigue.  HENT: Negative for hearing loss and sore throat.   Eyes: Negative for blurred vision, double vision and photophobia.  Respiratory: Negative for cough, hemoptysis, shortness of breath, wheezing and stridor.   Cardiovascular: Negative for chest pain, palpitations, orthopnea and leg swelling.  Gastrointestinal: Positive for abdominal pain, nausea and vomiting. Negative for blood in stool, constipation, diarrhea and melena.  Genitourinary: Negative for dysuria, flank pain, hematuria and urgency.  Musculoskeletal: Negative for back pain, joint pain, myalgias and neck pain.  Skin: Negative for rash.  Neurological: Negative for dizziness, sensory change, focal weakness, seizures, loss of consciousness, weakness and headaches.  Endo/Heme/Allergies: Negative for polydipsia.  Psychiatric/Behavioral: Negative for depression and memory loss. The patient is not nervous/anxious and does not have insomnia.     DRUG ALLERGIES:   Allergies  Allergen Reactions  . Codeine Other (See Comments)   VITALS:  Blood pressure (!) 147/87, pulse 80, temperature 97.9 F (36.6 C), temperature source Oral, resp. rate 18, height 5\' 9"  (1.753 m), weight 84.2 kg (185 lb 9.6 oz), SpO2 97 %. PHYSICAL EXAMINATION:  Physical Exam  Constitutional:  He is oriented to person, place, and time. He appears well-developed.  HENT:  Head: Normocephalic.  Mouth/Throat: Oropharynx is clear and moist.  Eyes: Pupils are equal, round, and reactive to light. Conjunctivae and EOM are normal. No scleral icterus.  Neck: Normal range of motion. Neck supple. No JVD present. No tracheal deviation present.  Cardiovascular: Normal rate, regular rhythm and normal heart sounds. Exam reveals no gallop.  No murmur heard. Pulmonary/Chest: Effort normal and breath sounds normal. No respiratory distress. He has no wheezes. He has no rales.  Abdominal: He exhibits distension. Bowel sounds are decreased. There is generalized tenderness.  Musculoskeletal: Normal range of motion. He exhibits no edema or tenderness.  Neurological: He is alert and oriented to person, place, and time. No cranial nerve deficit.  Skin: No rash noted. No erythema.  Psychiatric: He has a normal mood and affect.   LABORATORY PANEL:  Male CBC Recent Labs  Lab 11/07/17 1208  WBC 7.3  HGB 10.4*  HCT 29.7*  PLT 360   ------------------------------------------------------------------------------------------------------------------ Chemistries  Recent Labs  Lab 11/07/17 1208  11/09/17 0504  NA 141   < > 141  K 3.7   < > 3.2*  CL 111   < > 109  CO2 22   < > 28  GLUCOSE 118*   < > 144*  BUN 22   < > 20  CREATININE 0.78   < > 0.60*  CALCIUM 8.2*   < > 8.0*  MG 2.1   < > 1.9  AST 30  --   --   ALT 24  --   --   ALKPHOS 140*  --   --  BILITOT 0.9  --   --    < > = values in this interval not displayed.   RADIOLOGY:  Dg Abd Acute W/chest  Result Date: 11/08/2017 CLINICAL DATA:  Ileus following gastrointestinal surgery. EXAM: DG ABDOMEN ACUTE W/ 1V CHEST COMPARISON:  CT of the abdomen pelvis 11/07/2017 and abdominal radiograph 11/07/2017 FINDINGS: Surgical drain is present in the right upper abdomen. Gaseous distension of the stomach. There continues to be dilated gas-filled  loops of small bowel throughout the abdomen. Again noted is high-density contrast in the colon but this may be from contrast administrated on 10/30/2017. Right arm PICC line tip is in the SVC. Increased fullness along the right side of the mediastinum and probably related to volume loss. Right basilar chest densities are suggestive for atelectasis and right pleural fluid. No evidence for free air. IMPRESSION: No change in the dilated loops of small bowel and gaseous distension of the stomach. Differential diagnosis includes a postoperative ileus versus distal small bowel obstruction. Densities in the right lower chest are most compatible with pleural fluid and atelectasis. Electronically Signed   By: Richarda OverlieAdam  Henn M.D.   On: 11/08/2017 14:28   ASSESSMENT AND PLAN:  Postop ileus, ; more distention, more abdominal pain and also has vomiting now.  Continue n.p.o., started on TPN, surgery is following, needs NG tube  insertion again for colonic decompression.  * Atypical chest pain. Heparin drip and Nitropaste were discontinued per Dr. Herbie BaltimoreHarding, , Stress test is normal. Echo: LV EF: 55% -   65%. And is n.p.o. start aspirin, statins and patient can take p.o.  *Hypertension.    IV beta-blockers.  Patient has nausea  even with water.  *Hyperlipidemia.  Lipitor..  Acute cholecystitis per CT. status post lap chole by surgery,     Post op urine Retention, inserted Foley, on Flomax ' .Marland Kitchen.   History of asthma: Continue bronchodilators as needed.   no wheezing today.  HY kalemia, hypomagnesemia, re electrolytes.   Discussed with patient's son Ethelene Brownsnthony yesterday.    All the records are reviewed and case discussed with Care Management/Social Worker. Management plans discussed with the patient, family and they are in agreement.  CODE STATUS: Full Code  TOTAL TIME TAKING CARE OF THIS PATIENT: 38 minutes.   More than 50% of the time was spent in counseling/coordination of care: YES  POSSIBLE D/C IN 1-2  DAYS, DEPENDING ON CLINICAL CONDITION.   Katha HammingSnehalatha Meighan Treto M.D on 11/09/2017 at 12:58 PM  Between 7am to 6pm - Pager - [phone]  After 6pm go to [email or web address] - Therapist, nutritionalpassword EPAS [facility]  [facility] [place] [facility]

## 2017-11-09 NOTE — Progress Notes (Signed)
POD # 7 Ileus More nausea and vomiting last night, unable to place NGT, PT requesting not to have one at this time some Flatus Xray reviewed, c/w ileus  PE NAD Abd: decrease bs, soft but distended, no peritonitis, wounds healing well, no infection. JP serous  A/ p Persistent ileus Continue NPO and TPN No surgical intervention

## 2017-11-10 LAB — COMPREHENSIVE METABOLIC PANEL
ALT: 24 U/L (ref 0–44)
AST: 34 U/L (ref 15–41)
Albumin: 2.2 g/dL — ABNORMAL LOW (ref 3.5–5.0)
Alkaline Phosphatase: 132 U/L — ABNORMAL HIGH (ref 38–126)
Anion gap: 4 — ABNORMAL LOW (ref 5–15)
BUN: 18 mg/dL (ref 8–23)
CO2: 26 mmol/L (ref 22–32)
CREATININE: 0.7 mg/dL (ref 0.61–1.24)
Calcium: 7.6 mg/dL — ABNORMAL LOW (ref 8.9–10.3)
Chloride: 107 mmol/L (ref 98–111)
Glucose, Bld: 95 mg/dL (ref 70–99)
POTASSIUM: 3.8 mmol/L (ref 3.5–5.1)
Sodium: 137 mmol/L (ref 135–145)
Total Bilirubin: 0.6 mg/dL (ref 0.3–1.2)
Total Protein: 4.8 g/dL — ABNORMAL LOW (ref 6.5–8.1)

## 2017-11-10 LAB — GLUCOSE, CAPILLARY
GLUCOSE-CAPILLARY: 79 mg/dL (ref 70–99)
Glucose-Capillary: 106 mg/dL — ABNORMAL HIGH (ref 70–99)
Glucose-Capillary: 86 mg/dL (ref 70–99)
Glucose-Capillary: 91 mg/dL (ref 70–99)

## 2017-11-10 LAB — CBC
HCT: 25.5 % — ABNORMAL LOW (ref 40.0–52.0)
Hemoglobin: 9 g/dL — ABNORMAL LOW (ref 13.0–18.0)
MCH: 32.7 pg (ref 26.0–34.0)
MCHC: 35.3 g/dL (ref 32.0–36.0)
MCV: 92.8 fL (ref 80.0–100.0)
PLATELETS: 271 10*3/uL (ref 150–440)
RBC: 2.74 MIL/uL — ABNORMAL LOW (ref 4.40–5.90)
RDW: 13.5 % (ref 11.5–14.5)
WBC: 5.8 10*3/uL (ref 3.8–10.6)

## 2017-11-10 LAB — MAGNESIUM: MAGNESIUM: 2.1 mg/dL (ref 1.7–2.4)

## 2017-11-10 LAB — PHOSPHORUS: Phosphorus: 2.6 mg/dL (ref 2.5–4.6)

## 2017-11-10 MED ORDER — TRACE MINERALS CR-CU-MN-SE-ZN 10-1000-500-60 MCG/ML IV SOLN
INTRAVENOUS | Status: AC
  Administered 2017-11-10: 18:00:00 via INTRAVENOUS
  Filled 2017-11-10: qty 1800

## 2017-11-10 MED ORDER — FAT EMULSION PLANT BASED 20 % IV EMUL
240.0000 mL | INTRAVENOUS | Status: AC
  Administered 2017-11-10: 240 mL via INTRAVENOUS
  Filled 2017-11-10: qty 240

## 2017-11-10 MED ORDER — POTASSIUM CHLORIDE 10 MEQ/100ML IV SOLN
10.0000 meq | INTRAVENOUS | Status: AC
  Administered 2017-11-10 (×2): 10 meq via INTRAVENOUS
  Filled 2017-11-10 (×2): qty 100

## 2017-11-10 NOTE — Progress Notes (Signed)
Pt alert and oriented in room eating dinner. Pt now on clear liquid diet and tolerating well. TPN and fat emulsion infusing. Pt had multiple BMs today. No nausea or vomiting from pt today. Abdomen is less distended and soft. JP drain still intact.

## 2017-11-10 NOTE — Progress Notes (Signed)
Ileus Feeling better Multiple BMs No emesis Hungry AVSS JP 530cc  PE NAD Abd: decrease bs, soft, less distended. Incision c/d/i, staples in place, no infection JP serous fluid.  A/P Ileus resolving Due to high output will keep JP for now, no evidence of bile leak at this time Advance to clears only

## 2017-11-10 NOTE — Care Management Important Message (Signed)
Important Message  Patient Details  Name: Brian Montes MRN: [id number] Date of Birth: 07/25/1945   Medicare Important Message Given:  Yes    Olegario MessierKathy A Infinity Jeffords 11/10/2017, 3:21 PM

## 2017-11-10 NOTE — Progress Notes (Signed)
PHARMACY - ADULT TOTAL PARENTERAL NUTRITION CONSULT NOTE   Pharmacy Consult for TPN and electrolyte replacement Indication: prolonged ileus post cholecystectomy  Patient Measurements: Height: 5\' 9"  (175.3 cm) Weight: 185 lb 9.6 oz (84.2 kg) IBW/kg (Calculated) : 70.7 TPN AdjBW (KG): 84.5 Body mass index is 27.41 kg/m. 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 Now with two episodes of emesis. 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 PICC placement and baseline TPN labs are pending. Pharmacy consulted for TPN order and electrolyte management.   7/24 NG tube was ordered but patient was unable to tolerate. Hopefully he'll allow additional attempt this morning.   7/26 NG was unable to be placed yesterday, now patient requesting no NGT.   GI: POD 8 (11/02/17) laparoscopic cholecystectomy with gel hand placement, CXR with worsening gas concerning for ileus vs obstruction. Started TPN yesterday.  Endo: No noted history of DM, SSI Q6H ordered Insulin requirements in the past 24 hours:  Lytes: K 3.8, Ca 7.6, albumin 2.2, adjusted Ca 9.04, phos 2.6, Mg 2.1 Renal: SCr 11/05/17 0.81 mg/dL seems stable this admission will follow with BMP in TPN labs. 7/24 SCr 0.61 mg/dL 4/097/25 SCr 0.6 mg/dL 8/117/26 SCr 0.7 mg/dL Pulm:  Cards: HTN, HLD Hepatobil: Neuro: ID:  TPN Access: DL PICC placed 9/14/787/23/19 TPN start date: 11/07/17 first admin at 22:00  Nutritional Goals (per RD recommendation on 11/07/17): KCal: 2064  Protein: 90 grams Fluid: 2040 mL  Goal TPN rate is 75 ml/hr   Current Nutrition: NPO, TPN at goal  Plan:  Continue Clinimix E 5/20 to goal 75 mL/hr. Continue date ILE 20% at 20 mL/hr for 12 hours This TPN provides 90 g of protein, 360 g of dextrose, and 48 g of lipids which provides 2064 kCals per day,  meeting approximately 90% of patient needs Electrolytes in TPN: as provided in E 5/20 Add MVI, trace elements Add SSI sensitive Q6H per protocol - no history of DM IVF was stopped yesterday d/t TPN advanced to goal Monitor TPN labs per protocol F/U 11/13/17 electrolytes  11/07/17 12:08 baseline electrolytes are acceptable. No electrolyte supplement required today. Will recheck electrolytes tomorrow with AM labs per protocol.   11/08/17 04:43 hypokalemia noted. IV KCl 10 mEq Q1H x 4 doses given this morning. Will recheck electrolytes tomorrow with AM labs.   11/09/17 05:04 hypokalemia again today. KCL IV 10 mEq Q1h x 5 doses. Other electrolytes stable. TPN was advanced to goal yesterday. Plan to continue as ordered and check electrolytes per protocol.   11/10/17 07:51 electrolytes noted this morning. Will give KCl IV 10 mEq Q2H x 2 doses. Next labs scheduled for 11/13/17 per TPN protocol. Plan to continue TPN as ordered over the weekend.  Carola FrostNathan A Andrell Bergeson, Pharm.D., BCPS Clinical Pharmacist 11/10/2017,9:44 AM

## 2017-11-10 NOTE — Progress Notes (Signed)
Urinary catheter removed.

## 2017-11-10 NOTE — Progress Notes (Signed)
[employer] [facility] - [place] at [facility]   PATIENT NAME: Brian Montes    MR#:  [id number]  DATE OF BIRTH:  20-Oct-1945  NG tube insertion was not successful last night, patient could not tolerate the procedure.  But he is willing to try again this morning.  CHIEF COMPLAINT:   Chief Complaint  Patient presents with  . Chest Pain   Patient clinically is feeling better.  Reporting flatus.  Denies nausea vomiting this a.m.  Could not tolerate NG tube yesterday started on clear liquids by surgery REVIEW OF SYSTEMS:  Review of Systems  Constitutional: Negative for chills, fever and malaise/fatigue.  HENT: Negative for hearing loss and sore throat.   Eyes: Negative for blurred vision, double vision and photophobia.  Respiratory: Negative for cough, hemoptysis, shortness of breath, wheezing and stridor.   Cardiovascular: Negative for chest pain, palpitations, orthopnea and leg swelling.  Gastrointestinal: Positive for abdominal pain. Negative for blood in stool, constipation, diarrhea, melena, nausea and vomiting.  Genitourinary: Negative for dysuria, flank pain, hematuria and urgency.  Musculoskeletal: Negative for back pain, joint pain, myalgias and neck pain.  Skin: Negative for rash.  Neurological: Negative for dizziness, sensory change, focal weakness, seizures, loss of consciousness, weakness and headaches.  Endo/Heme/Allergies: Negative for polydipsia.  Psychiatric/Behavioral: Negative for depression and memory loss. The patient is not nervous/anxious and does not have insomnia.     DRUG ALLERGIES:   Allergies  Allergen Reactions  . Codeine Other (See Comments)   VITALS:  Blood pressure 135/83, pulse 85, temperature 99.2 F (37.3 C), temperature source Oral, resp. rate 18, height 5\' 9"  (1.753 m), weight 84.2 kg (185 lb 9.6 oz), SpO2 96 %. PHYSICAL EXAMINATION:  Physical Exam  Constitutional: He is oriented to person, place, and time. He appears well-developed.    HENT:  Head: Normocephalic.  Mouth/Throat: Oropharynx is clear and moist.  Eyes: Pupils are equal, round, and reactive to light. Conjunctivae and EOM are normal. No scleral icterus.  Neck: Normal range of motion. Neck supple. No JVD present. No tracheal deviation present.  Cardiovascular: Normal rate, regular rhythm and normal heart sounds. Exam reveals no gallop.  No murmur heard. Pulmonary/Chest: Effort normal and breath sounds normal. No respiratory distress. He has no wheezes. He has no rales.  Abdominal: He exhibits distension. Bowel sounds are decreased. There is generalized tenderness.  Musculoskeletal: Normal range of motion. He exhibits no edema or tenderness.  Neurological: He is alert and oriented to person, place, and time. No cranial nerve deficit.  Skin: No rash noted. No erythema.  Psychiatric: He has a normal mood and affect.   LABORATORY PANEL:  Male CBC Recent Labs  Lab 11/10/17 0751  WBC 5.8  HGB 9.0*  HCT 25.5*  PLT 271   ------------------------------------------------------------------------------------------------------------------ Chemistries  Recent Labs  Lab 11/10/17 0751  NA 137  K 3.8  CL 107  CO2 26  GLUCOSE 95  BUN 18  CREATININE 0.70  CALCIUM 7.6*  MG 2.1  AST 34  ALT 24  ALKPHOS 132*  BILITOT 0.6   RADIOLOGY:  No results found. ASSESSMENT AND PLAN:     *Postop ileus, ;   started on TPN, surgery is following, patient could not tolerate NG tube Clinically better today.  Started on clears  * Atypical chest pain. Heparin drip and Nitropaste were discontinued per Dr. Herbie Baltimore, , Stress test is normal. Echo: LV EF: 55% -   65%. start aspirin, statins when patient can take p.o.  *Hypertension.  IV beta-blockers.  Patient has nausea  even with water.  *Hyperlipidemia.  Lipitor..  Acute cholecystitis per CT. status post lap chole by surgery, JP drain -serous out put  Post op urine Retention, d/c Foley, on Flomax '  .Marland Kitchen.   History of asthma: Continue bronchodilators as needed.   no wheezing today.  Hkpoalemia, hypomagnesemia, reseme electrolytes.       All the records are reviewed and case discussed with Care Management/Social Worker. Management plans discussed with the patient, family and they are in agreement.  CODE STATUS: Full Code  TOTAL TIME TAKING CARE OF THIS PATIENT: 38 minutes.   More than 50% of the time was spent in counseling/coordination of care: YES  POSSIBLE D/C IN 1-2 DAYS, DEPENDING ON CLINICAL CONDITION.   Ramonita LabAruna Deloss Amico M.D on 11/10/2017 at 10:46 PM  Between 7am to 6pm - Pager - [phone]  After 6pm go to [email or web address] - Therapist, nutritionalpassword EPAS [facility]  [facility] [place] [facility]

## 2017-11-11 ENCOUNTER — Inpatient Hospital Stay: Payer: Medicare Other

## 2017-11-11 LAB — GLUCOSE, CAPILLARY
GLUCOSE-CAPILLARY: 124 mg/dL — AB (ref 70–99)
GLUCOSE-CAPILLARY: 135 mg/dL — AB (ref 70–99)
GLUCOSE-CAPILLARY: 137 mg/dL — AB (ref 70–99)
Glucose-Capillary: 81 mg/dL (ref 70–99)

## 2017-11-11 MED ORDER — FAT EMULSION PLANT BASED 20 % IV EMUL
250.0000 mL | INTRAVENOUS | Status: AC
  Administered 2017-11-11: 250 mL via INTRAVENOUS
  Filled 2017-11-11: qty 250

## 2017-11-11 MED ORDER — M.V.I. ADULT IV INJ
INJECTION | INTRAVENOUS | Status: AC
  Administered 2017-11-11: 18:00:00 via INTRAVENOUS
  Filled 2017-11-11: qty 1800

## 2017-11-11 NOTE — Progress Notes (Signed)
Patient ID: Santa GeneraBilly J Fleet, male   DOB: 12/17/1945, 72 y.o.   MRN: [id number] Patient with post op Ileus Feeling better, refers improving pain. Refers had bowel movement.  No emesis Hungry AVSS JP 100cc serous  PE NAD Abd: still with decrease bs, soft, and distended. Wound dry and clean, staples in place, no infection JP serous fluid.  A/P Ileus resolving, but abdominal physical exam still with distention. Will repeat xray to assess small bowel dilation. If still dilated will keep on clear. If dilation improved may progress.

## 2017-11-11 NOTE — Progress Notes (Signed)
[facility] - [place] at [facility]   PATIENT NAME: Brian Montes    MR#:  [id number]  DATE OF BIRTH:  06-29-1945  NG tube insertion was not successful last night, patient could not tolerate the procedure.  But he is willing to try again this morning.  CHIEF COMPLAINT:   Chief Complaint  Patient presents with  . Chest Pain   Patient clinically is feeling better.  Reporting flatus. Pt had a small BM   Denies nausea vomiting this a.m.  Could not tolerate NG tube , tolerating on clear liquids by surgery REVIEW OF SYSTEMS:  Review of Systems  Constitutional: Negative for chills, fever and malaise/fatigue.  HENT: Negative for hearing loss and sore throat.   Eyes: Negative for blurred vision, double vision and photophobia.  Respiratory: Negative for cough, hemoptysis, shortness of breath, wheezing and stridor.   Cardiovascular: Negative for chest pain, palpitations, orthopnea and leg swelling.  Gastrointestinal: Positive for abdominal pain. Negative for blood in stool, constipation, diarrhea, melena, nausea and vomiting.  Genitourinary: Negative for dysuria, flank pain, hematuria and urgency.  Musculoskeletal: Negative for back pain, joint pain, myalgias and neck pain.  Skin: Negative for rash.  Neurological: Negative for dizziness, sensory change, focal weakness, seizures, loss of consciousness, weakness and headaches.  Endo/Heme/Allergies: Negative for polydipsia.  Psychiatric/Behavioral: Negative for depression and memory loss. The patient is not nervous/anxious and does not have insomnia.     DRUG ALLERGIES:   Allergies  Allergen Reactions  . Codeine Other (See Comments)   VITALS:  Blood pressure 136/70, pulse 84, temperature 99 F (37.2 C), temperature source Oral, resp. rate 18, height 5\' 9"  (1.753 m), weight 84 kg (185 lb 3.3 oz), SpO2 98 %. PHYSICAL EXAMINATION:  Physical Exam  Constitutional: He is oriented to person, place, and time. He appears  well-developed.  HENT:  Head: Normocephalic.  Mouth/Throat: Oropharynx is clear and moist.  Eyes: Pupils are equal, round, and reactive to light. Conjunctivae and EOM are normal. No scleral icterus.  Neck: Normal range of motion. Neck supple. No JVD present. No tracheal deviation present.  Cardiovascular: Normal rate, regular rhythm and normal heart sounds. Exam reveals no gallop.  No murmur heard. Pulmonary/Chest: Effort normal and breath sounds normal. No respiratory distress. He has no wheezes. He has no rales.  Abdominal: He exhibits distension. Bowel sounds are decreased. There is generalized tenderness.  Musculoskeletal: Normal range of motion. He exhibits no edema or tenderness.  Neurological: He is alert and oriented to person, place, and time. No cranial nerve deficit.  Skin: No rash noted. No erythema.  Psychiatric: He has a normal mood and affect.   LABORATORY PANEL:  Male CBC Recent Labs  Lab 11/10/17 0751  WBC 5.8  HGB 9.0*  HCT 25.5*  PLT 271   ------------------------------------------------------------------------------------------------------------------ Chemistries  Recent Labs  Lab 11/10/17 0751  NA 137  K 3.8  CL 107  CO2 26  GLUCOSE 95  BUN 18  CREATININE 0.70  CALCIUM 7.6*  MG 2.1  AST 34  ALT 24  ALKPHOS 132*  BILITOT 0.6   RADIOLOGY:  Dg Abd 2 Views  Result Date: 11/11/2017 CLINICAL DATA:  72 year old male with a history abdominal distention. Cholecystectomy 11/02/2017 EXAM: ABDOMEN - 2 VIEW COMPARISON:  CT 11/07/2017, plain film 11/08/2017 FINDINGS: Persistent air-fluid level within the stomach as well as within small bowel loops in the mid abdomen on the upright view. Relative paucity of colonic gas. There is small volume gas within the rectum.  Surgical suture line in the right lower quadrant. Unchanged position of the surgical drain of the right upper quadrant with surgical changes of cholecystectomy. Surgical staples within the midline  abdomen. Opacity at the right lung base. IMPRESSION: Evidence of improving ileus/obstruction, with decreasing diameter of small bowel loops, though with persisting air-fluid levels. There is now small volume gas at the rectum. Surgical drain remains with changes of cholecystectomy. Right pleural effusion. Electronically Signed   By: Gilmer MorJaime  Wagner D.O.   On: 11/11/2017 12:09   ASSESSMENT AND PLAN:     *Postop ileus, ; Clinically improving except for abdominal distention Repeat x-ray with persistent air-fluid levels  started on TPN, surgery is following, patient could not tolerate NG tube Clinically better today.   on clears  * Atypical chest pain. Heparin drip and Nitropaste were discontinued per Dr. Herbie BaltimoreHarding, , Stress test is normal. Echo: LV EF: 55% -   65%. start aspirin, statins when patient can take p.o.  *Hypertension.    IV beta-blockers.  Patient has nausea  even with water.  *Hyperlipidemia.  Lipitor..  Acute cholecystitis per CT. status post lap chole by surgery, JP drain -serous out put  Post op urine Retention, d/c Foley, on Flomax ' .Marland Kitchen.   History of asthma: Continue bronchodilators as needed.   no wheezing today.  Hkpoalemia, hypomagnesemia, reseme electrolytes.       All the records are reviewed and case discussed with Care Management/Social Worker. Management plans discussed with the patient, family and they are in agreement.  CODE STATUS: Full Code  TOTAL TIME TAKING CARE OF THIS PATIENT: 34 minutes.   More than 50% of the time was spent in counseling/coordination of care: YES  POSSIBLE D/C IN 1-2 DAYS, DEPENDING ON CLINICAL CONDITION.   Ramonita LabAruna Nyema Montes M.D on 11/11/2017 at 3:00 PM  Between 7am to 6pm - Pager - [phone]  After 6pm go to [email or web address] - Therapist, nutritionalpassword [facility]  [facility] [place] [facility]

## 2017-11-12 LAB — GLUCOSE, CAPILLARY
GLUCOSE-CAPILLARY: 137 mg/dL — AB (ref 70–99)
Glucose-Capillary: 116 mg/dL — ABNORMAL HIGH (ref 70–99)
Glucose-Capillary: 132 mg/dL — ABNORMAL HIGH (ref 70–99)
Glucose-Capillary: 144 mg/dL — ABNORMAL HIGH (ref 70–99)

## 2017-11-12 MED ORDER — TRACE MINERALS CR-CU-MN-SE-ZN 10-1000-500-60 MCG/ML IV SOLN
INTRAVENOUS | Status: AC
  Administered 2017-11-12: 19:00:00 via INTRAVENOUS
  Filled 2017-11-12: qty 1800

## 2017-11-12 MED ORDER — FAT EMULSION PLANT BASED 20 % IV EMUL
250.0000 mL | INTRAVENOUS | Status: AC
  Administered 2017-11-12: 250 mL via INTRAVENOUS
  Filled 2017-11-12: qty 250

## 2017-11-12 NOTE — Progress Notes (Addendum)
[facility] - [place] at [facility]   PATIENT NAME: Brian LearnBilly Hundal    MR#:  [id number]  DATE OF BIRTH:  11/14/1945  NG tube insertion was not successful last night, patient could not tolerate the procedure.  But he is willing to try again this morning.  CHIEF COMPLAINT:   Chief Complaint  Patient presents with  . Chest Pain   Patient clinically is feeling better.  Reporting nausea and heartburn.  has flatus. Pt had a small BMs  , tolerating on clear liquids  REVIEW OF SYSTEMS:  Review of Systems  Constitutional: Negative for chills, fever and malaise/fatigue.  HENT: Negative for hearing loss and sore throat.   Eyes: Negative for blurred vision, double vision and photophobia.  Respiratory: Negative for cough, hemoptysis, shortness of breath, wheezing and stridor.   Cardiovascular: Negative for chest pain, palpitations, orthopnea and leg swelling.  Gastrointestinal: Positive for abdominal pain. Negative for blood in stool, constipation, diarrhea, melena, nausea and vomiting.  Genitourinary: Negative for dysuria, flank pain, hematuria and urgency.  Musculoskeletal: Negative for back pain, joint pain, myalgias and neck pain.  Skin: Negative for rash.  Neurological: Negative for dizziness, sensory change, focal weakness, seizures, loss of consciousness, weakness and headaches.  Endo/Heme/Allergies: Negative for polydipsia.  Psychiatric/Behavioral: Negative for depression and memory loss. The patient is not nervous/anxious and does not have insomnia.     DRUG ALLERGIES:   Allergies  Allergen Reactions  . Codeine Other (See Comments)   VITALS:  Blood pressure 125/68, pulse (!) 105, temperature 98.8 F (37.1 C), temperature source Oral, resp. rate 18, height 5\' 9"  (1.753 m), weight 84 kg (185 lb 3.3 oz), SpO2 95 %. PHYSICAL EXAMINATION:  Physical Exam  Constitutional: He is oriented to person, place, and time. He appears well-developed.  HENT:  Head:  Normocephalic.  Mouth/Throat: Oropharynx is clear and moist.  Eyes: Pupils are equal, round, and reactive to light. Conjunctivae and EOM are normal. No scleral icterus.  Neck: Normal range of motion. Neck supple. No JVD present. No tracheal deviation present.  Cardiovascular: Normal rate, regular rhythm and normal heart sounds. Exam reveals no gallop.  No murmur heard. Pulmonary/Chest: Effort normal and breath sounds normal. No respiratory distress. He has no wheezes. He has no rales.  Abdominal: He exhibits distension. Bowel sounds are decreased. There is generalized tenderness.  Musculoskeletal: Normal range of motion. He exhibits no edema or tenderness.  Neurological: He is alert and oriented to person, place, and time. No cranial nerve deficit.  Skin: No rash noted. No erythema.  Psychiatric: He has a normal mood and affect.   LABORATORY PANEL:  Male CBC Recent Labs  Lab 11/10/17 0751  WBC 5.8  HGB 9.0*  HCT 25.5*  PLT 271   ------------------------------------------------------------------------------------------------------------------ Chemistries  Recent Labs  Lab 11/10/17 0751  NA 137  K 3.8  CL 107  CO2 26  GLUCOSE 95  BUN 18  CREATININE 0.70  CALCIUM 7.6*  MG 2.1  AST 34  ALT 24  ALKPHOS 132*  BILITOT 0.6   RADIOLOGY:  No results found. ASSESSMENT AND PLAN:     *Postop ileus, ; Clinically improving except for abdominal distention D/w  Dr. Maia Planintron  Repeat x-ray with persistent air-fluid levels   on TPN, surgery is following, patient could not tolerate NG tube    on clears  * Atypical chest pain. Heparin drip and Nitropaste were discontinued per Dr. Herbie BaltimoreHarding, , Stress test is normal. Echo: LV EF: 55% -  65%. start aspirin, statins when patient can take p.o.  *Hypertension.    IV beta-blockers.  Patient has nausea  even with water.  *Hyperlipidemia.  Lipitor..  Acute cholecystitis per CT. status post lap chole by surgery, JP drain -serous out  put  Post op urine Retention, d/c Foley, on Flomax ' .Marland Kitchen   History of asthma: Continue bronchodilators as needed.   no wheezing today.  Hkpoalemia, hypomagnesemia, reseme electrolytes.       All the records are reviewed and case discussed with Care Management/Social Worker. Management plans discussed with the patient, family and they are in agreement.  CODE STATUS: Full Code  TOTAL TIME TAKING CARE OF THIS PATIENT: 28 minutes.   More than 50% of the time was spent in counseling/coordination of care: YES  POSSIBLE D/C IN 1-2 DAYS, DEPENDING ON CLINICAL CONDITION.   Ramonita Lab M.D on 11/12/2017 at 2:12 PM  Between 7am to 6pm - Pager - [phone]  After 6pm go to [email or web address] - Therapist, nutritional Hospitalists

## 2017-11-12 NOTE — Progress Notes (Signed)
Patient ID: Brian Montes, male   DOB: 09/17/1945, 72 y.o.   MRN: [id number] Patient with post op Ileus Refers no vomiting but mild nausea. Refers passing flatus.  No emesis  Vitals:   11/12/17 0020 11/12/17 0759  BP: 140/79 125/68  Pulse: 97 (!) 105  Resp: 16 18  Temp: 100.1 F (37.8 C) 98.8 F (37.1 C)  SpO2: 99% 95%    PE  Abd: still with decrease bs, soft, and distended. Wound dry and clean, staples in place, no infection JP serous fluid.  A/P Ileus resolving, but abdominal physical exam still with distention and patient today refers some nausea after a medication given and does not feel comfortable advancing diet. Will keep on clear diet.   Carolan ShiverEdgardo Cintron-Diaz, MD

## 2017-11-12 NOTE — Progress Notes (Signed)
PHARMACY - ADULT TOTAL PARENTERAL NUTRITION CONSULT NOTE   Pharmacy Consult for TPN and electrolyte replacement Indication: prolonged ileus post cholecystectomy  Patient Measurements: Height: 5\' 9"  (175.3 cm) Weight: 185 lb 3.3 oz (84 kg) IBW/kg (Calculated) : 70.7 TPN AdjBW (KG): 84.5 Body mass index is 27.35 kg/m. 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 Now with two episodes of emesis. 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 PICC placement and baseline TPN labs are pending. Pharmacy consulted for TPN order and electrolyte management.   7/24 NG tube was ordered but patient was unable to tolerate. Hopefully he'll allow additional attempt this morning.   7/26 NG was unable to be placed yesterday, now patient requesting no NGT.   GI: POD 8 (11/02/17) laparoscopic cholecystectomy with gel hand placement, CXR with worsening gas concerning for ileus vs obstruction. Started TPN yesterday.  Endo: No noted history of DM, SSI Q6H ordered Insulin requirements in the past 24 hours:  Lytes: K 3.8, Ca 7.6, albumin 2.2, adjusted Ca 9.04, phos 2.6, Mg 2.1 Renal: SCr 11/05/17 0.81 mg/dL seems stable this admission will follow with BMP in TPN labs. 7/24 SCr 0.61 mg/dL 0/987/25 SCr 0.6 mg/dL 1/197/26 SCr 0.7 mg/dL Pulm:  Cards: HTN, HLD Hepatobil: Neuro: ID:  TPN Access: DL PICC placed 1/47/827/23/19 TPN start date: 11/07/17 first admin at 22:00  Nutritional Goals (per RD recommendation on 11/07/17): KCal: 2064  Protein: 90 grams Fluid: 2040 mL  Goal TPN rate is 75 ml/hr   Current Nutrition: NPO, TPN at goal  Plan:  Continue Clinimix E 5/20 to goal 75 mL/hr. Continue date ILE 20% at 20 mL/hr for 12 hours This TPN provides 90 g of protein, 360 g of dextrose, and 48 g of lipids which provides 2064 kCals per day, meeting  approximately 90% of patient needs Electrolytes in TPN: as provided in E 5/20 Add MVI, trace elements Add SSI sensitive Q6H per protocol - no history of DM IVF was stopped yesterday d/t TPN advanced to goal Monitor TPN labs per protocol F/U 11/13/17 electrolytes  11/07/17 12:08 baseline electrolytes are acceptable. No electrolyte supplement required today. Will recheck electrolytes tomorrow with AM labs per protocol.   11/08/17 04:43 hypokalemia noted. IV KCl 10 mEq Q1H x 4 doses given this morning. Will recheck electrolytes tomorrow with AM labs.   11/09/17 05:04 hypokalemia again today. KCL IV 10 mEq Q1h x 5 doses. Other electrolytes stable. TPN was advanced to goal yesterday. Plan to continue as ordered and check electrolytes per protocol.   11/10/17 07:51 electrolytes noted this morning. Will give KCl IV 10 mEq Q2H x 2 doses. Next labs scheduled for 11/13/17 per TPN protocol. Plan to continue TPN as ordered over the weekend.  11/12/17: Will continue Clinimix E5/20 with 10 MVI and 1 ml of Trace @ 75 ml/hr with 20% Lipids @ 20 ml/hr. Will reorder labs in am.    Candance Bohlman D, Pharm.D., BCPS Clinical Pharmacist 11/12/2017,8:52 AM

## 2017-11-13 LAB — CBC
HCT: 23.8 % — ABNORMAL LOW (ref 40.0–52.0)
HEMOGLOBIN: 8.4 g/dL — AB (ref 13.0–18.0)
MCH: 32.9 pg (ref 26.0–34.0)
MCHC: 35.2 g/dL (ref 32.0–36.0)
MCV: 93.7 fL (ref 80.0–100.0)
Platelets: 243 10*3/uL (ref 150–440)
RBC: 2.54 MIL/uL — AB (ref 4.40–5.90)
RDW: 14 % (ref 11.5–14.5)
WBC: 5.6 10*3/uL (ref 3.8–10.6)

## 2017-11-13 LAB — COMPREHENSIVE METABOLIC PANEL
ALBUMIN: 2.4 g/dL — AB (ref 3.5–5.0)
ALK PHOS: 146 U/L — AB (ref 38–126)
ALT: 29 U/L (ref 0–44)
AST: 37 U/L (ref 15–41)
Anion gap: 6 (ref 5–15)
BUN: 14 mg/dL (ref 8–23)
CALCIUM: 7.8 mg/dL — AB (ref 8.9–10.3)
CO2: 25 mmol/L (ref 22–32)
CREATININE: 0.74 mg/dL (ref 0.61–1.24)
Chloride: 107 mmol/L (ref 98–111)
GFR calc Af Amer: >60 mL/min (ref >60)
GFR calc non Af Amer: >60 mL/min (ref >60)
GLUCOSE: 149 mg/dL — AB (ref 70–99)
Potassium: 3.9 mmol/L (ref 3.5–5.1)
SODIUM: 138 mmol/L (ref 135–145)
Total Bilirubin: 0.6 mg/dL (ref 0.3–1.2)
Total Protein: 5 g/dL — ABNORMAL LOW (ref 6.5–8.1)

## 2017-11-13 LAB — GLUCOSE, CAPILLARY
GLUCOSE-CAPILLARY: 131 mg/dL — AB (ref 70–99)
GLUCOSE-CAPILLARY: 126 mg/dL — AB (ref 70–99)
Glucose-Capillary: 111 mg/dL — ABNORMAL HIGH (ref 70–99)
Glucose-Capillary: 120 mg/dL — ABNORMAL HIGH (ref 70–99)

## 2017-11-13 LAB — PHOSPHORUS: Phosphorus: 3.1 mg/dL (ref 2.5–4.6)

## 2017-11-13 LAB — TRIGLYCERIDES: Triglycerides: 121 mg/dL (ref <150)

## 2017-11-13 LAB — MAGNESIUM: Magnesium: 2.2 mg/dL (ref 1.7–2.4)

## 2017-11-13 MED ORDER — FAT EMULSION PLANT BASED 20 % IV EMUL
250.0000 mL | INTRAVENOUS | Status: AC
  Administered 2017-11-13: 250 mL via INTRAVENOUS
  Filled 2017-11-13: qty 250

## 2017-11-13 MED ORDER — BOOST / RESOURCE BREEZE PO LIQD CUSTOM
1.0000 | Freq: Three times a day (TID) | ORAL | Status: DC
  Administered 2017-11-13: 1 via ORAL

## 2017-11-13 MED ORDER — TRACE MINERALS CR-CU-MN-SE-ZN 10-1000-500-60 MCG/ML IV SOLN
INTRAVENOUS | Status: AC
  Administered 2017-11-13: 19:00:00 via INTRAVENOUS
  Filled 2017-11-13: qty 1800

## 2017-11-13 MED ORDER — PANTOPRAZOLE SODIUM 40 MG PO TBEC
40.0000 mg | DELAYED_RELEASE_TABLET | Freq: Every day | ORAL | Status: DC
  Administered 2017-11-13 – 2017-11-16 (×4): 40 mg via ORAL
  Filled 2017-11-13 (×4): qty 1

## 2017-11-13 MED ORDER — ENSURE ENLIVE PO LIQD: 237.0000 mL | Freq: Two times a day (BID) | ORAL | Status: DC

## 2017-11-13 NOTE — Progress Notes (Signed)
Nutrition Follow-up  DOCUMENTATION CODES:   Not applicable  INTERVENTION:  Continue Clinimix E 5/20 at 75 mL/hr + 20% ILE at 20 mL/hr over 12 hours. Provides 2064 kcal, 90 grams of protein, 1800 mL fluid daily (+240 mL from lipids).  Continue adult MVI and trace elements as daily TPN additives.  Patient would benefit from clear oral nutrition supplement but he is refusing at this time because he reports he does not like clear liquids. Once diet advanced past clear liquids, recommend providing Ensure Enlive TID, each supplement provides 350 kcal and 20 grams of protein.  NUTRITION DIAGNOSIS:   Inadequate oral intake related to acute illness as evidenced by NPO status.  Diet has now been advanced but PO intake remains inadequate.  GOAL:   Patient will meet greater than or equal to 90% of their needs  Met with TPN.  MONITOR:   Diet advancement, Labs, Weight trends, Skin, I & O's  REASON FOR ASSESSMENT:   NPO/Clear Liquid Diet, Consult New TPN/TNA  ASSESSMENT:   72 y.o. 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   -Patient s/p laparoscopic cholecystectomy with gel hand port on 7/18. Intraoperative findings included severe pericholecystic inflammation with clearly identified cystic duct and thrombosed cystic artery. -Now with post-operative ileus that is slowly resolving. -He was started on clear liquids on 7/26.  Met with patient at bedside. Abdomen remains distended but is softer. He reports he is starting to feel a little better. He passed flatus and had a BM yesterday. No BM or flatus yet today. He has been up ambulating around the unit. He reports he is not eating very much of his clear liquids tray due to nausea and  that he does not like clear liquids. He reports only eating bites of gelatin at meals. He refuses clear oral nutrition supplement at this time.  IV Access: right basilic double lumen PICC placed 7/23; ECG technology used to confirm PICC is in SVC  TPN: pt tolerating Clinimix E 5/20 at 75 mL/hr + 20% ILE at 20 mL/hr over 12 hours  Medications reviewed and include: Dulcolax RE daily, Colace, Novolog 0-9 units Q6hrs, senna, Flomax, famotidine.  Labs reviewed: CBG 116-132 past 24 hrs. Potassium, Phosphorus, and Magnesium are WNL.  I/O: UOP unmeasured; one documented BM yesterday  Weight trend: 80.4 kg on 7/29; -4 kg from admission  Diet Order:   Diet Order           Diet clear liquid Room service appropriate? Yes; Fluid consistency: Thin  Diet effective now        Diet - low sodium heart healthy        Diet - low sodium heart healthy          EDUCATION NEEDS:   Education needs have been addressed  Skin:  Skin Assessment: Skin Integrity Issues: Skin Integrity Issues:: Incisions Incisions: closed incision to abdomen  Last BM:  11/12/2017 - medium type 7  Height:   Ht Readings from Last 1 Encounters:  10/27/17 5' 9"  (1.753 m)    Weight:   Wt Readings from Last 1 Encounters:  11/13/17 177 lb 4 oz (80.4 kg)    Ideal Body Weight:  72.7 kg  BMI:  Body mass index is 26.18 kg/m.  Estimated Nutritional Needs:  Kcal:  1800-2100kcal/day   Protein:  73-88g/day   Fluid:  >1.8L/day   Willey Blade, MS, RD, LDN Office: [phone] Pager: [phone] After Hours/Weekend Pager: [phone]

## 2017-11-13 NOTE — Progress Notes (Signed)
PHARMACY - ADULT TOTAL PARENTERAL NUTRITION CONSULT NOTE   Pharmacy Consult for TPN and electrolyte replacement Indication: prolonged ileus post cholecystectomy  Patient Measurements: Height: 5\' 9"  (175.3 cm) Weight: 177 lb 4 oz (80.4 kg) IBW/kg (Calculated) : 70.7 TPN AdjBW (KG): 84.5 Body mass index is 26.18 kg/m. 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 Now with two episodes of emesis. 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 PICC placement and baseline TPN labs are pending. Pharmacy consulted for TPN order and electrolyte management.   7/24 NG tube was ordered but patient was unable to tolerate. Hopefully he'll allow additional attempt this morning.   7/26 NG was unable to be placed yesterday, now patient requesting no NGT.   GI: POD 8 (11/02/17) laparoscopic cholecystectomy with gel hand placement, CXR with worsening gas concerning for ileus vs obstruction. Started TPN yesterday.  Endo: No noted history of DM, SSI Q6H ordered Insulin requirements in the past 24 hours:  Lytes: K 3.8, Ca 7.6, albumin 2.2, adjusted Ca 9.04, phos 2.6, Mg 2.1 Renal: SCr 11/05/17 0.81 mg/dL seems stable this admission will follow with BMP in TPN labs. 7/24 SCr 0.61 mg/dL 6/967/25 SCr 0.6 mg/dL 2/957/26 SCr 0.7 mg/dL Pulm:  Cards: HTN, HLD Hepatobil: Neuro: ID:  TPN Access: DL PICC placed 2/84/137/23/19 TPN start date: 11/07/17 first admin at 22:00  Nutritional Goals (per RD recommendation on 11/07/17): KCal: 2064  Protein: 90 grams Fluid: 2040 mL  Goal TPN rate is 75 ml/hr   Current Nutrition: NPO, TPN at goal  Plan:  Continue Clinimix E 5/20 to goal 75 mL/hr. Continue date ILE 20% at 20 mL/hr for 12 hours This TPN provides 90 g of protein, 360 g of dextrose, and 48 g of lipids which provides 2064 kCals per day, meeting  approximately 90% of patient needs Electrolytes in TPN: as provided in E 5/20 Add MVI, trace elements Add SSI sensitive Q6H per protocol - no history of DM IVF was stopped yesterday d/t TPN advanced to goal Monitor TPN labs per protocol  11/13/17: Will continue Clinimix E5/20 with 10 MVI and 1 ml of Trace @ 75 ml/hr with 20% Lipids @ 20 ml/hr.  Next electrolytes on Thursday  AmeLie Hollars D ElbaMaccia, VermontPharm.D., BCPS Clinical Pharmacist 11/13/2017,12:31 PM

## 2017-11-13 NOTE — Progress Notes (Signed)
SURGICAL PROGRESS NOTE  Hospital Day(s): 17.   Post op day(s): 11 Days Post-Op.   Interval History: Patient seen and examined, no acute events or new complaints overnight. Patient reports +flatus, +BM, and +ambulation with decreased abdominal distention, but persistent GERD symptoms without emesis, tolerating only small amounts of clear liquids, TPN ongoing.  Review of Systems:  Constitutional: denies fever, chills  Respiratory: denies any shortness of breath  Cardiovascular: denies chest pain or palpitations  Gastrointestinal: abdominal pain, N/V, and bowel function as per interval history Musculoskeletal: denies pain, decreased motor or sensation Integumentary: denies any other rashes or skin discolorations except post-surgical abdominal wounds  Vital signs in last 24 hours: [min-max] current  Temp:  [99.1 F (37.3 C)-99.9 F (37.7 C)] 99.9 F (37.7 C) (07/29 0753) Pulse Rate:  [86-100] 89 (07/29 1051) Resp:  [15-18] 16 (07/29 0753) BP: (128-143)/(60-88) 128/71 (07/29 1057) SpO2:  [95 %-98 %] 95 % (07/29 1051) Weight:  [177 lb 4 oz (80.4 kg)] 177 lb 4 oz (80.4 kg) (07/29 0500)     Height: 5\' 9"  (175.3 cm) Weight: 177 lb 4 oz (80.4 kg) BMI (Calculated): 26.16   Intake/Output this shift:  Total I/O In: -  Out: 60 [Drains:60]   Intake/Output last 2 shifts:  @IOLAST2SHIFTS @   Physical Exam:  Constitutional: alert, cooperative and no distress  Respiratory: breathing non-labored at rest  Cardiovascular: regular rate and sinus rhythm  Gastrointestinal: soft and essentially non-tender, more soft with slightly decreased abdominal distention  Labs:  CBC Latest Ref Rng & Units 11/13/2017 11/10/2017 11/07/2017  WBC 3.8 - 10.6 K/uL 5.6 5.8 7.3  Hemoglobin 13.0 - 18.0 g/dL 9.5(M8.4(L) 8.4(X9.0(L) 10.4(L)  Hematocrit 40.0 - 52.0 % 23.8(L) 25.5(L) 29.7(L)  Platelets 150 - 440 K/uL 243 271 360   CMP Latest Ref Rng & Units 11/13/2017 11/10/2017 11/09/2017  Glucose 70 - 99 mg/dL 324(M149(H) 95 010(U144(H)   BUN 8 - 23 mg/dL 14 18 20   Creatinine 0.61 - 1.24 mg/dL 7.250.74 3.660.70 4.40(H0.60(L)  Sodium 135 - 145 mmol/L 138 137 141  Potassium 3.5 - 5.1 mmol/L 3.9 3.8 3.2(L)  Chloride 98 - 111 mmol/L 107 107 109  CO2 22 - 32 mmol/L 25 26 28   Calcium 8.9 - 10.3 mg/dL 7.8(L) 7.6(L) 8.0(L)  Total Protein 6.5 - 8.1 g/dL 5.0(L) 4.8(L) -  Total Bilirubin 0.3 - 1.2 mg/dL 0.6 0.6 -  Alkaline Phos 38 - 126 U/L 146(H) 132(H) -  AST 15 - 41 U/L 37 34 -  ALT 0 - 44 U/L 29 24 -   Imaging studies: No new pertinent imaging studies   Assessment/Plan: (ICD-10's: 11K80.12) 72 y.o. male with prolonged post-surgical ileus on TPN 11 Days Post-Op s/p laparoscopic cholecystectomy with gel hand port for severe acute on cronic calculous cholecystitis, complicated by pertinent comorbidities including HTN, HLD, and what appears to be dementia vs delerium, s/p remote laparoscope[facility]otomy-type incisional scar.   - TPN ongoing             - minimize narcotics             - ambulation encouraged             - continue with clear liquids diet + Boost Breeze and Ensure nutritional supplements             - monitor abdominal exam and bowel function             - medical management of  comorbidities             - DVT prophylaxis  All of the above findings and recommendations were discussed with the patient and his medical team, and all of patient's questions were answered to his expressed satisfaction.  -- Scherrie Gerlach Earlene Plater, MD, RPVI [place]: [facility] General Surgery - Partnering for exceptional care. Office: [phone]

## 2017-11-13 NOTE — Care Management Important Message (Signed)
Important Message  Patient Details  Name: Brian Montes MRN: [id number] Date of Birth: 04/19/1945   Medicare Important Message Given:  Yes    Olegario MessierKathy A Alanie Syler 11/13/2017, 11:38 AM

## 2017-11-13 NOTE — Progress Notes (Signed)
Physical Therapy Treatment Patient Details Name: Brian Montes MRN: [id number] DOB: Mar 07, 1946 Today's Date: 11/13/2017    History of Present Illness Brian Montes  is a 72 y.o. male with a known history of HTN, HL, Past tobacco use presents to the emergency room due to left-sided chest pain that has been ongoing intermittently for 1 year and has progressively worsened.   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  Pt was Ind with bed mobility tasks with good stability and control and was SBA assist with transfers with good eccentric and concentric control.  Pt was able to amb over 200' without an AD with good stability and ascended/descended three steps with good eccentric and concentric control with use of one rail.  Pt will benefit from HHPT services upon discharge to safely address above deficits for decreased caregiver assistance and eventual return to PLOF.     Follow Up Recommendations  Home health PT     Equipment Recommendations  Rolling walker with 5" wheels    Recommendations for Other Services       Precautions / Restrictions Precautions Precautions: Fall Restrictions Weight Bearing Restrictions: No    Mobility  Bed Mobility Overal bed mobility: Independent             General bed mobility comments: Log roll technique education provided; pt Ind in/out of bed with good effort and speed during tasks  Transfers Overall transfer level: Needs assistance Equipment used: None Transfers: Sit to/from Stand Sit to Stand: Supervision         General transfer comment: Good control and stability with transfers without AD  Ambulation/Gait Ambulation/Gait assistance: Supervision Gait Distance (Feet): 225 Feet Assistive device: None;IV Pole Gait Pattern/deviations:  WFL(Within Functional Limits) Gait velocity: normal    General Gait Details: Good stability and cadence during amb both with holding IV pole and without UE support   Stairs Stairs: Yes Stairs assistance: Supervision Stair Management: One rail Right Number of Stairs: 3 General stair comments: Good eccentric and concentric control ascending and descending stairs   Wheelchair Mobility    Modified Rankin (Stroke Patients Only)       Balance Overall balance assessment: No apparent balance deficits (not formally assessed)                                          Cognition Arousal/Alertness: Awake/alert Behavior During Therapy: WFL for tasks assessed/performed Overall Cognitive Status: Within Functional Limits for tasks assessed                                        Exercises Total Joint Exercises Ankle Circles/Pumps: Strengthening;Both;10 reps Quad Sets: Strengthening;Both;10 reps Gluteal Sets: Strengthening;Both;10 reps Heel Slides: AROM;Both;5 reps Long Arc Quad: AROM;Both;10 reps;15 reps Knee Flexion: AROM;Both;10 reps;15 reps Marching in Standing: AROM;Both;10 reps;Standing    General Comments        Pertinent Vitals/Pain Pain Assessment: No/denies pain    Home Living                      Prior Function  PT Goals (current goals can now be found in the care plan section) Progress towards PT goals: Progressing toward goals    Frequency    Min 2X/week      PT Plan Current plan remains appropriate    Co-evaluation              AM-PAC PT "6 Clicks" Daily Activity  Outcome Measure                   End of Session Equipment Utilized During Treatment: Gait belt Activity Tolerance: Patient tolerated treatment well Patient left: in chair;with chair alarm set;with call bell/phone within reach Nurse Communication: Mobility status;Other (comment)(JP drain clip gave way from gown during  amb with drain hanging by hose, nursing called to assess) PT Visit Diagnosis: Muscle weakness (generalized) (M62.81);Difficulty in walking, not elsewhere classified (R26.2)     Time: [id number] PT Time Calculation (min) (ACUTE ONLY): 24 min  Charges:  $Gait Training: 8-22 mins $Therapeutic Exercise: 8-22 mins                     D. Scott Brian Montes PT, DPT 11/13/17, 11:02 AM

## 2017-11-13 NOTE — Progress Notes (Signed)
[employer] - [place] at [facility]   PATIENT NAME: Brian Montes    MR#:  [id number]  DATE OF BIRTH:  08/08/1945  NG tube insertion was not successful last night, patient could not tolerate the procedure.  But he is willing to try again this morning.  CHIEF COMPLAINT:   Chief Complaint  Patient presents with  . Chest Pain   Patient still reporting nausea and heartburn. On pepcid,  has flatus. Pt had a small BMs  , tolerating clear liquids.  Out of bed to chair REVIEW OF SYSTEMS:  Review of Systems  Constitutional: Negative for chills, fever and malaise/fatigue.  HENT: Negative for hearing loss and sore throat.   Eyes: Negative for blurred vision, double vision and photophobia.  Respiratory: Negative for cough, hemoptysis, shortness of breath, wheezing and stridor.   Cardiovascular: Negative for chest pain, palpitations, orthopnea and leg swelling.  Gastrointestinal: Positive for abdominal pain. Negative for blood in stool, constipation, diarrhea, melena, nausea and vomiting.  Genitourinary: Negative for dysuria, flank pain, hematuria and urgency.  Musculoskeletal: Negative for back pain, joint pain, myalgias and neck pain.  Skin: Negative for rash.  Neurological: Negative for dizziness, sensory change, focal weakness, seizures, loss of consciousness, weakness and headaches.  Endo/Heme/Allergies: Negative for polydipsia.  Psychiatric/Behavioral: Negative for depression and memory loss. The patient is not nervous/anxious and does not have insomnia.     DRUG ALLERGIES:   Allergies  Allergen Reactions  . Codeine Other (See Comments)   VITALS:  Blood pressure 128/71, pulse 89, temperature 99.9 F (37.7 C), temperature source Oral, resp. rate 16, height 5\' 9"  (1.753 m), weight 80.4 kg (177 lb 4 oz), SpO2 95 %. PHYSICAL EXAMINATION:  Physical Exam  Constitutional: He is oriented to person, place, and time. He appears well-developed.  HENT:  Head: Normocephalic.   Mouth/Throat: Oropharynx is clear and moist.  Eyes: Pupils are equal, round, and reactive to light. Conjunctivae and EOM are normal. No scleral icterus.  Neck: Normal range of motion. Neck supple. No JVD present. No tracheal deviation present.  Cardiovascular: Normal rate, regular rhythm and normal heart sounds. Exam reveals no gallop.  No murmur heard. Pulmonary/Chest: Effort normal and breath sounds normal. No respiratory distress. He has no wheezes. He has no rales.  Abdominal: He exhibits distension. Bowel sounds are decreased. There is generalized tenderness.  Musculoskeletal: Normal range of motion. He exhibits no edema or tenderness.  Neurological: He is alert and oriented to person, place, and time. No cranial nerve deficit.  Skin: No rash noted. No erythema.  Psychiatric: He has a normal mood and affect.   LABORATORY PANEL:  Male CBC Recent Labs  Lab 11/13/17 0514  WBC 5.6  HGB 8.4*  HCT 23.8*  PLT 243   ------------------------------------------------------------------------------------------------------------------ Chemistries  Recent Labs  Lab 11/13/17 0513 11/13/17 0514  NA  --  138  K  --  3.9  CL  --  107  CO2  --  25  GLUCOSE  --  149*  BUN  --  14  CREATININE  --  0.74  CALCIUM  --  7.8*  MG 2.2  --   AST  --  37  ALT  --  29  ALKPHOS  --  146*  BILITOT  --  0.6   RADIOLOGY:  No results found. ASSESSMENT AND PLAN:     *Postop ileus, ; Clinically improving except for abdominal distention and heartburn Continue Pepcid and Zofran as needed for nausea Repeat x-ray with  persistent air-fluid levels   on TPN, surgery is following, patient could not tolerate NG tube    on clears  * Atypical chest pain. Heparin drip and Nitropaste were discontinued per Dr. Herbie Baltimore, , Stress test is normal. Echo: LV EF: 55% -   65%. start aspirin, statins when patient can take p.o.  *Hypertension.    IV beta-blockers.  Patient has nausea  even with  water.  *Hyperlipidemia.  Lipitor..  Acute cholecystitis per CT. status post lap chole by surgery, JP drain -serous out put  Post op urine Retention, d/c Foley, on Flomax ' .Marland Kitchen   History of asthma: Continue bronchodilators as needed.   no wheezing today.  Hkpoalemia, hypomagnesemia, reseme electrolytes.       All the records are reviewed and case discussed with Care Management/Social Worker. Management plans discussed with the patient, family and they are in agreement.  CODE STATUS: Full Code  TOTAL TIME TAKING CARE OF THIS PATIENT: 28 minutes.   More than 50% of the time was spent in counseling/coordination of care: YES  POSSIBLE D/C IN 1-2 DAYS, DEPENDING ON CLINICAL CONDITION.   Ramonita Lab M.D on 11/13/2017 at 3:35 PM  Between 7am to 6pm - Pager - [phone]  After 6pm go to [email or web address] - Therapist, nutritional Hospitalists

## 2017-11-14 ENCOUNTER — Encounter: Payer: Self-pay | Admitting: Surgery

## 2017-11-14 LAB — GLUCOSE, CAPILLARY
GLUCOSE-CAPILLARY: 134 mg/dL — AB (ref 70–99)
GLUCOSE-CAPILLARY: 116 mg/dL — AB (ref 70–99)
Glucose-Capillary: 116 mg/dL — ABNORMAL HIGH (ref 70–99)
Glucose-Capillary: 123 mg/dL — ABNORMAL HIGH (ref 70–99)

## 2017-11-14 MED ORDER — TRACE MINERALS CR-CU-MN-SE-ZN 10-1000-500-60 MCG/ML IV SOLN
INTRAVENOUS | Status: AC
  Administered 2017-11-14: 19:00:00 via INTRAVENOUS
  Filled 2017-11-14: qty 912

## 2017-11-14 MED ORDER — FAT EMULSION PLANT BASED 20 % IV EMUL
250.0000 mL | INTRAVENOUS | Status: AC
  Administered 2017-11-14: 250 mL via INTRAVENOUS
  Filled 2017-11-14: qty 250

## 2017-11-14 MED ORDER — BOOST / RESOURCE BREEZE PO LIQD CUSTOM: 1.0000 | Freq: Three times a day (TID) | ORAL | Status: DC

## 2017-11-14 NOTE — Progress Notes (Signed)
PHARMACY - ADULT TOTAL PARENTERAL NUTRITION CONSULT NOTE   Pharmacy Consult for TPN and electrolyte replacement Indication: prolonged ileus post cholecystectomy  Patient Measurements: Height: 5\' 9"  (175.3 cm) Weight: 173 lb 15.1 oz (78.9 kg) IBW/kg (Calculated) : 70.7 TPN AdjBW (KG): 84.5 Body mass index is 25.69 kg/m. 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 Now with two episodes of emesis. 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 PICC placement and baseline TPN labs are pending. Pharmacy consulted for TPN order and electrolyte management.   7/24 NG tube was ordered but patient was unable to tolerate. Hopefully he'll allow additional attempt this morning.   7/26 NG was unable to be placed yesterday, now patient requesting no NGT.   GI: POD 8 (11/02/17) laparoscopic cholecystectomy with gel hand placement, CXR with worsening gas concerning for ileus vs obstruction. Started TPN yesterday.  Endo: No noted history of DM, SSI Q6H ordered Insulin requirements in the past 24 hours:  Lytes: K 3.8, Ca 7.6, albumin 2.2, adjusted Ca 9.04, phos 2.6, Mg 2.1 Renal: SCr 11/05/17 0.81 mg/dL seems stable this admission will follow with BMP in TPN labs. 7/24 SCr 0.61 mg/dL 1/617/25 SCr 0.6 mg/dL 0/967/26 SCr 0.7 mg/dL Pulm:  Cards: HTN, HLD Hepatobil: Neuro: ID:  TPN Access: DL PICC placed 0/45/407/23/19 TPN start date: 11/07/17 first admin at 22:00  Nutritional Goals (per RD recommendation on 11/07/17): KCal: 2064  Protein: 90 grams Fluid: 2040 mL  Goal TPN rate is 75 ml/hr   Current Nutrition: NPO, TPN at goal  Plan:  Decrease TPN to 1/2 rate 38 ml/hr tonight with plans to stop tomorrow Continue date ILE 20% at 20 mL/hr for 12 hours This TPN provides 90 g of protein, 360 g of dextrose, and 48 g of lipids which  provides 2064 kCals per day, meeting approximately 90% of patient needs Electrolytes in TPN: as provided in E 5/20 Add MVI, trace elements Add SSI sensitive Q6H per protocol - no history of DM IVF was stopped yesterday d/t TPN advanced to goal Monitor TPN labs per protocol   Olene FlossMelissa D Gianelle Mccaul, Pharm.D., BCPS Clinical Pharmacist 11/14/2017,1:42 PM

## 2017-11-14 NOTE — Progress Notes (Signed)
Nutrition Brief Follow-up  INTERVENTION:  Plan is to decrease TPN to 1/2 rate today and assess for discontinuation of TPN tomorrow if patient continues to tolerate diet.  Provide Clinimix E 5/20 at 38 mL/hr + 20% ILE at 20 mL/hr over 12 hours.  Continue Boost Breeze po TID, each supplement provides 250 kcal and 9 grams of protein. Switched time to be WC since Ensure Enlive is BM.  Continue Ensure Enlive po BID, each supplement provides 350 kcal and 20 grams of protein.  Encouraged patient to drink oral nutrition supplements to help meet calorie and protein needs.  ASSESSMENT:   Met with patient at bedside. He reports he is tolerating clear liquids today and is taking in more liquids at meals than yesterday. He reports he has not yet tried the oral nutrition supplements that were ordered yesterday.  Discussed with Surgeon. Plan is for diet to advance to full liquids today. Plan is to decrease TPN to 1/2 rate tonight.  Estimated Nutritional Needs:  Kcal:  1800-2100kcal/day  Protein:  73-88g/day  Fluid:  >1.8L/day   Willey Blade, MS, RD, LDN Office: [phone] Pager: [phone] After Hours/Weekend Pager: [phone]

## 2017-11-14 NOTE — Progress Notes (Signed)
SURGICAL PROGRESS NOTE  Hospital Day(s): 18.   Post op day(s): 12 Days Post-Op.   Interval History: Patient seen and examined, no acute events or new complaints overnight. Patient reports his abdomen feels softer and flatter, though remains mildly distended. He has been tolerating clear liquids + ambulation and otherwise denies N/V, fever/chills, CP, or SOB.  Review of Systems:  Constitutional: denies fever, chills  Respiratory: denies any shortness of breath  Cardiovascular: denies chest pain or palpitations  Gastrointestinal: abdominal pain, N/V, and bowel function as per interval history Musculoskeletal: denies pain, decreased motor or sensation Integumentary: denies any other rashes or skin discolorations except post-surgical abdominal wounds  Vital signs in last 24 hours: [min-max] current  Temp:  [97.8 F (36.6 C)-98.7 F (37.1 C)] 97.8 F (36.6 C) (07/30 0737) Pulse Rate:  [90-92] 92 (07/30 0737) Resp:  [18] 18 (07/30 0737) BP: (132-150)/(66-78) 132/66 (07/30 0737) SpO2:  [97 %-99 %] 97 % (07/30 0737) Weight:  [173 lb 15.1 oz (78.9 kg)] 173 lb 15.1 oz (78.9 kg) (07/30 0500)     Height: 5\' 9"  (175.3 cm) Weight: 173 lb 15.1 oz (78.9 kg) BMI (Calculated): 25.68   Intake/Output this shift:  Total I/O In: 120 [P.O.:120] Out: -    Intake/Output last 2 shifts:  @IOLAST2SHIFTS @   Physical Exam:  Constitutional: alert, cooperative and no distress  Respiratory: breathing non-labored at rest  Cardiovascular: regular rate and sinus rhythm  Gastrointestinal: soft and completely non-tender, RUQ drain well-secured with minimal sero-sanguinous fluid in tubing and bulb, decreased moderate abdominal distention  Labs:  CBC Latest Ref Rng & Units 11/13/2017 11/10/2017 11/07/2017  WBC 3.8 - 10.6 K/uL 5.6 5.8 7.3  Hemoglobin 13.0 - 18.0 g/dL 8.1(X8.4(L) 9.1(Y9.0(L) 10.4(L)  Hematocrit 40.0 - 52.0 % 23.8(L) 25.5(L) 29.7(L)  Platelets 150 - 440 K/uL 243 271 360   CMP Latest Ref Rng & Units  11/13/2017 11/10/2017 11/09/2017  Glucose 70 - 99 mg/dL 782(N149(H) 95 562(Z144(H)  BUN 8 - 23 mg/dL 14 18 20   Creatinine 0.61 - 1.24 mg/dL 3.080.74 6.570.70 8.46(N0.60(L)  Sodium 135 - 145 mmol/L 138 137 141  Potassium 3.5 - 5.1 mmol/L 3.9 3.8 3.2(L)  Chloride 98 - 111 mmol/L 107 107 109  CO2 22 - 32 mmol/L 25 26 28   Calcium 8.9 - 10.3 mg/dL 7.8(L) 7.6(L) 8.0(L)  Total Protein 6.5 - 8.1 g/dL 5.0(L) 4.8(L) -  Total Bilirubin 0.3 - 1.2 mg/dL 0.6 0.6 -  Alkaline Phos 38 - 126 U/L 146(H) 132(H) -  AST 15 - 41 U/L 37 34 -  ALT 0 - 44 U/L 29 24 -   Imaging studies: No new pertinent imaging studies   Assessment/Plan:(ICD-10's: K80.12) 72 y.o.malewith resolving prolonged post-surgical ileuson TPN 11 Days Post-Ops/p laparoscopic cholecystectomy with gel hand portfor severe acute on cronic calculous cholecystitis, complicated by pertinent comorbidities includingHTN, HLD, and what appears to be dementia vs delerium, s/p remote laparoscope-assisted [facility] with midline laparotomy-type incisional scar.              - advanced to full liquids diet (Boost Breeze + Ensure nutritional supplements)  - will 1/2 TPN tonight and plan to likely advance to soft diet tomorrow morning and stop TPN tomorrow night, as discussed with medical physician and nutritionist - ambulation again encouraged, minimize narcotics - monitor abdominal exam and bowel function - medical management of comorbidities  - discharge planning ~48 hours - DVT prophylaxis  All of the above findings and recommendations were discussed  with the patient andhismedical team, and all of patient's questions were answered to his expressed satisfaction.  -- Scherrie Gerlach Earlene Plater, MD, RPVI [place]: [facility] General Surgery - Partnering for exceptional care. Office: [phone]

## 2017-11-14 NOTE — Progress Notes (Signed)
[employer] -  at [facility]   PATIENT NAME: Brian Montes    MR#:  [id number]  DATE OF BIRTH:  07/20/1945  NG tube insertion was not successful last night, patient could not tolerate the procedure.  But he is willing to try again this morning.  CHIEF COMPLAINT:   Chief Complaint  Patient presents with  . Chest Pain   Patient is feeling much better.  Denies any nausea or heartburn today.  Tolerating clear liquid diet Out of bed to chair REVIEW OF SYSTEMS:  Review of Systems  Constitutional: Negative for chills, fever and malaise/fatigue.  HENT: Negative for hearing loss and sore throat.   Eyes: Negative for blurred vision, double vision and photophobia.  Respiratory: Negative for cough, hemoptysis, shortness of breath, wheezing and stridor.   Cardiovascular: Negative for chest pain, palpitations, orthopnea and leg swelling.  Gastrointestinal: Negative for abdominal pain, blood in stool, constipation, diarrhea, melena, nausea and vomiting.  Genitourinary: Negative for dysuria, flank pain, hematuria and urgency.  Musculoskeletal: Negative for back pain, joint pain, myalgias and neck pain.  Skin: Negative for rash.  Neurological: Negative for dizziness, sensory change, focal weakness, seizures, loss of consciousness, weakness and headaches.  Endo/Heme/Allergies: Negative for polydipsia.  Psychiatric/Behavioral: Negative for depression and memory loss. The patient is not nervous/anxious and does not have insomnia.     DRUG ALLERGIES:   Allergies  Allergen Reactions  . Codeine Other (See Comments)   VITALS:  Blood pressure 132/66, pulse 92, temperature 97.8 F (36.6 C), temperature source Oral, resp. rate 18, height 5\' 9"  (1.753 m), weight 78.9 kg (173 lb 15.1 oz), SpO2 97 %. PHYSICAL EXAMINATION:  Physical Exam  Constitutional: He is oriented to person, place, and time. He appears well-developed.  HENT:  Head: Normocephalic.  Mouth/Throat: Oropharynx  is clear and moist.  Eyes: Pupils are equal, round, and reactive to light. Conjunctivae and EOM are normal. No scleral icterus.  Neck: Normal range of motion. Neck supple. No JVD present. No tracheal deviation present.  Cardiovascular: Normal rate, regular rhythm and normal heart sounds. Exam reveals no gallop.  No murmur heard. Pulmonary/Chest: Effort normal and breath sounds normal. No respiratory distress. He has no wheezes. He has no rales.  Abdominal: He exhibits distension. Bowel sounds are decreased. There is no tenderness.  Musculoskeletal: Normal range of motion. He exhibits no edema or tenderness.  Neurological: He is alert and oriented to person, place, and time. No cranial nerve deficit.  Skin: No rash noted. No erythema.  Psychiatric: He has a normal mood and affect.   LABORATORY PANEL:  Male CBC Recent Labs  Lab 11/13/17 0514  WBC 5.6  HGB 8.4*  HCT 23.8*  PLT 243   ------------------------------------------------------------------------------------------------------------------ Chemistries  Recent Labs  Lab 11/13/17 0513 11/13/17 0514  NA  --  138  K  --  3.9  CL  --  107  CO2  --  25  GLUCOSE  --  149*  BUN  --  14  CREATININE  --  0.74  CALCIUM  --  7.8*  MG 2.2  --   AST  --  37  ALT  --  29  ALKPHOS  --  146*  BILITOT  --  0.6   RADIOLOGY:  No results found. ASSESSMENT AND PLAN:     *Postop ileus, ; Clinically improving except for abdominal distention and heartburn Continue Pepcid and Zofran as needed for nausea Repeat x-ray with persistent air-fluid levels but patient is clinically  doing much better Discussed with surgery plan is to slowly taper his TPN and advance his diet as tolerated    * Atypical chest pain. Heparin drip and Nitropaste were discontinued per Dr. Herbie Baltimore, , Stress test is normal. Echo: LV EF: 55% -   65%. start aspirin, statins when patient can take p.o.  *Hypertension.    IV beta-blockers.  Patient has nausea  even  with water.  *Hyperlipidemia.  Lipitor..  Acute cholecystitis per CT. status post lap chole by surgery, JP drain -serous out put  Post op urine Retention, d/c Foley, on Flomax ' .Marland Kitchen   History of asthma: Continue bronchodilators as needed.   no wheezing today.  Hkpoalemia, hypomagnesemia, reseme electrolytes.       All the records are reviewed and case discussed with Care Management/Social Worker. Management plans discussed with the patient, family and they are in agreement.  CODE STATUS: Full Code  TOTAL TIME TAKING CARE OF THIS PATIENT: 28 minutes.   More than 50% of the time was spent in counseling/coordination of care: YES  POSSIBLE D/C IN 1-2 DAYS, DEPENDING ON CLINICAL CONDITION.   Ramonita Lab M.D on 11/14/2017 at 1:32 PM  Between 7am to 6pm - Pager - [phone]  After 6pm go to [email or web address] - Therapist, nutritional Hospitalists

## 2017-11-15 LAB — GLUCOSE, CAPILLARY
GLUCOSE-CAPILLARY: 111 mg/dL — AB (ref 70–99)
GLUCOSE-CAPILLARY: 90 mg/dL (ref 70–99)
Glucose-Capillary: 113 mg/dL — ABNORMAL HIGH (ref 70–99)
Glucose-Capillary: 146 mg/dL — ABNORMAL HIGH (ref 70–99)
Glucose-Capillary: 98 mg/dL (ref 70–99)

## 2017-11-15 NOTE — Care Management Important Message (Signed)
Important Message  Patient Details  Name: Brian Montes MRN: [id number] Date of Birth: 09/15/1945   Medicare Important Message Given:  Yes    Olegario MessierKathy A Nikalas Bramel 11/15/2017, 10:32 AM

## 2017-11-15 NOTE — Progress Notes (Signed)
[employer] - [place] at [facility]   PATIENT NAME: Brian Montes    MR#:  [id number]  DATE OF BIRTH:  01-Jun-1945  NG tube insertion was not successf  CHIEF COMPLAINT:   Chief Complaint  Patient presents with  . Chest Pain   Patient is feeling much better.  Tolerating full liquids out of bed to chair REVIEW OF SYSTEMS:  Review of Systems  Constitutional: Negative for chills, fever and malaise/fatigue.  HENT: Negative for hearing loss and sore throat.   Eyes: Negative for blurred vision, double vision and photophobia.  Respiratory: Negative for cough, hemoptysis, shortness of breath, wheezing and stridor.   Cardiovascular: Negative for chest pain, palpitations, orthopnea and leg swelling.  Gastrointestinal: Negative for abdominal pain, blood in stool, constipation, diarrhea, melena, nausea and vomiting.  Genitourinary: Negative for dysuria, flank pain, hematuria and urgency.  Musculoskeletal: Negative for back pain, joint pain, myalgias and neck pain.  Skin: Negative for rash.  Neurological: Negative for dizziness, sensory change, focal weakness, seizures, loss of consciousness, weakness and headaches.  Endo/Heme/Allergies: Negative for polydipsia.  Psychiatric/Behavioral: Negative for depression and memory loss. The patient is not nervous/anxious and does not have insomnia.     DRUG ALLERGIES:   Allergies  Allergen Reactions  . Codeine Other (See Comments)   VITALS:  Blood pressure 116/68, pulse 86, temperature 98.5 F (36.9 C), temperature source Oral, resp. rate 18, height 5\' 9"  (1.753 m), weight 78.8 kg (173 lb 11.6 oz), SpO2 96 %. PHYSICAL EXAMINATION:  Physical Exam  Constitutional: He is oriented to person, place, and time. He appears well-developed.  HENT:  Head: Normocephalic.  Mouth/Throat: Oropharynx is clear and moist.  Eyes: Pupils are equal, round, and reactive to light. Conjunctivae and EOM are normal. No scleral icterus.  Neck: Normal  range of motion. Neck supple. No JVD present. No tracheal deviation present.  Cardiovascular: Normal rate, regular rhythm and normal heart sounds. Exam reveals no gallop.  No murmur heard. Pulmonary/Chest: Effort normal and breath sounds normal. No respiratory distress. He has no wheezes. He has no rales.  Abdominal: He exhibits distension. Bowel sounds are decreased. There is no tenderness.  Musculoskeletal: Normal range of motion. He exhibits no edema or tenderness.  Neurological: He is alert and oriented to person, place, and time. No cranial nerve deficit.  Skin: No rash noted. No erythema.  Psychiatric: He has a normal mood and affect.   LABORATORY PANEL:  Male CBC Recent Labs  Lab 11/13/17 0514  WBC 5.6  HGB 8.4*  HCT 23.8*  PLT 243   ------------------------------------------------------------------------------------------------------------------ Chemistries  Recent Labs  Lab 11/13/17 0513 11/13/17 0514  NA  --  138  K  --  3.9  CL  --  107  CO2  --  25  GLUCOSE  --  149*  BUN  --  14  CREATININE  --  0.74  CALCIUM  --  7.8*  MG 2.2  --   AST  --  37  ALT  --  29  ALKPHOS  --  146*  BILITOT  --  0.6   RADIOLOGY:  No results found. ASSESSMENT AND PLAN:     *Postop ileus, ; status post lap cholecystectomy Clinically improving except for abdominal distention and heartburn Continue Pepcid and Zofran as needed for nausea Repeat x-ray with persistent air-fluid levels but patient is clinically doing much better Discussed with surgery plan is to  taper his TPN and advance his diet  to soft as tolerated.    *  Atypical chest pain. Heparin drip and Nitropaste were discontinued per Dr. Herbie BaltimoreHarding, , Stress test is normal. Echo: LV EF: 55% -   65%. start aspirin, statins when patient can take p.o.  *Hypertension.    IV beta-blockers.  Patient has nausea  even with water.  *Hyperlipidemia.  Lipitor..  Acute cholecystitis per CT. status post lap chole by surgery,  JP drain - Dr. Earlene Plateravis might consider removing few staples today  Post op urine Retention, d/c Foley, on Flomax ' .Marland Kitchen.   History of asthma: Continue bronchodilators as needed.   no wheezing today.   Anticipate discharge in a.m.  All the records are reviewed and case discussed with Care Management/Social Worker. Management plans discussed with the patient, family and they are in agreement.  CODE STATUS: Full Code  TOTAL TIME TAKING CARE OF THIS PATIENT: 28 minutes.   More than 50% of the time was spent in counseling/coordination of care: YES  POSSIBLE D/C IN 1- DAYS, DEPENDING ON CLINICAL CONDITION.   Brian Montes M.D on 11/15/2017 at 10:31 AM  Between 7am to 6pm - Pager - [phone]  After 6pm go to [email or web address] - Therapist, nutritionalpassword EPAS [facility]  [employer] [facility] [place] [facility]

## 2017-11-15 NOTE — Progress Notes (Signed)
PHARMACY - ADULT TOTAL PARENTERAL NUTRITION CONSULT NOTE   Pharmacy Consult for TPN and electrolyte replacement Indication: prolonged ileus post cholecystectomy  Patient Measurements: Height: 5\' 9"  (175.3 cm) Weight: 173 lb 11.6 oz (78.8 kg) IBW/kg (Calculated) : 70.7 TPN AdjBW (KG): 84.5 Body mass index is 25.65 kg/m. 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 Now with two episodes of emesis. 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 PICC placement and baseline TPN labs are pending. Pharmacy consulted for TPN order and electrolyte management.   7/24 NG tube was ordered but patient was unable to tolerate. Hopefully he'll allow additional attempt this morning.   7/26 NG was unable to be placed yesterday, now patient requesting no NGT.   GI: POD 8 (11/02/17) laparoscopic cholecystectomy with gel hand placement, CXR with worsening gas concerning for ileus vs obstruction. Started TPN yesterday.  Endo: No noted history of DM, SSI Q6H ordered Insulin requirements in the past 24 hours:  Lytes: K 3.8, Ca 7.6, albumin 2.2, adjusted Ca 9.04, phos 2.6, Mg 2.1 Renal: SCr 11/05/17 0.81 mg/dL seems stable this admission will follow with BMP in TPN labs. 7/24 SCr 0.61 mg/dL 7/827/25 SCr 0.6 mg/dL 9/567/26 SCr 0.7 mg/dL Pulm:  Cards: HTN, HLD Hepatobil: Neuro: ID:  TPN Access: DL PICC placed 2/13/087/23/19 TPN start date: 11/07/17 first admin at 22:00  Nutritional Goals (per RD recommendation on 11/07/17): KCal: 2064  Protein: 90 grams Fluid: 2040 mL  Goal TPN rate is 75 ml/hr   Current Nutrition: NPO, TPN at goal  Plan:  Plan is to continue at 1/2 rate until 1800 tonight and then stop. Will follow up on labs post d/c.   Olene FlossMelissa D Kathlen Sakurai, Pharm.D., BCPS Clinical Pharmacist 11/15/2017,3:17 PM

## 2017-11-15 NOTE — Progress Notes (Signed)
Physical Therapy Treatment Patient Details Name: Brian Montes MRN: [id number] DOB: [birth year] Today's Date: 11/15/2017    History of Present Illness Brian Montes  is a 72 y.o. male with a known history of HTN, HL, Past tobacco use presents to the emergency room due to left-sided chest pain that has been ongoing intermittently for 1 year and has progressively worsened.   Postoperative status post laparoscopic cholecystectomy for severe acute on chronic cholecystitis    PT Comments    Pt is progressing well towards goals and plan is to d/c tomorrow. Pt is able to perform bed mobility with supervision, transfer with supervision, and ambulate with CGA only to manage IV line but otherwise supervision. Pt ambulated 600' with gait speed of 5' per second and was able to safely walk backwards, side step, and change gait speed safely. Pt will continue to benefit from skilled PT to address higher level balance, strength, and functional mobility tasks. Pt does need assistance to manage JP drain and IV line. D/C recommendation continues to be appropriate at this time.  Follow Up Recommendations  Home health PT     Equipment Recommendations  None recommended by PT    Recommendations for Other Services       Precautions / Restrictions Precautions Precautions: Fall Restrictions Weight Bearing Restrictions: No    Mobility  Bed Mobility Overal bed mobility: Independent Bed Mobility: Supine to Sit     Supine to sit: Supervision     General bed mobility comments: Pt demonstrated safe bed mobility with a good speed  Transfers Overall transfer level: Needs assistance Equipment used: None Transfers: Sit to/from Stand Sit to Stand: Supervision         General transfer comment: Good control and stability with transfers without AD  Ambulation/Gait Ambulation/Gait assistance: Supervision;Min guard Gait Distance (Feet): 600 Feet Assistive device: None Gait Pattern/deviations: WFL(Within  Functional Limits) Gait velocity: 10 feet in 2 sec Gait velocity interpretation: >4.37 ft/sec, indicative of normal walking speed General Gait Details: Good stability and cadence during amb without UE support. Pt demonstared safe ability to ambulate backwards, side ways, and with slow/fast cadence. Pt only required CGA for safe managment of IV lines.   Stairs             Wheelchair Mobility    Modified Rankin (Stroke Patients Only)       Balance Overall balance assessment: No apparent balance deficits (not formally assessed)                                          Cognition Arousal/Alertness: Awake/alert Behavior During Therapy: WFL for tasks assessed/performed Overall Cognitive Status: Within Functional Limits for tasks assessed                                        Exercises Total Joint Exercises Ankle Circles/Pumps: Strengthening;Both;10 reps Quad Sets: Strengthening;Both;10 reps Gluteal Sets: Strengthening;Both;10 reps Heel Slides: AROM;Both;10 reps Marching in Standing: AROM;Both;10 reps;Standing General Exercises - Lower Extremity Straight Leg Raises: AROM;Both;15 reps    General Comments        Pertinent Vitals/Pain Pain Assessment: No/denies pain    Home Living                      Prior Function  PT Goals (current goals can now be found in the care plan section) Acute Rehab PT Goals Patient Stated Goal: get out of bed and walk PT Goal Formulation: With patient Time For Goal Achievement: 11/20/17 Potential to Achieve Goals: Good Progress towards PT goals: Progressing toward goals    Frequency    Min 2X/week      PT Plan Current plan remains appropriate    Co-evaluation              AM-PAC PT "6 Clicks" Daily Activity  Outcome Measure  Difficulty turning over in bed (including adjusting bedclothes, sheets and blankets)?: A Little Difficulty moving from lying on back to  sitting on the side of the bed? : None Difficulty sitting down on and standing up from a chair with arms (e.g., wheelchair, bedside commode, etc,.)?: None Help needed moving to and from a bed to chair (including a wheelchair)?: None Help needed walking in hospital room?: None Help needed climbing 3-5 steps with a railing? : A Little 6 Click Score: 22    End of Session Equipment Utilized During Treatment: Gait belt Activity Tolerance: Patient tolerated treatment well Patient left: in chair;with chair alarm set;with call bell/phone within reach Nurse Communication: Mobility status;Other (comment) PT Visit Diagnosis: Muscle weakness (generalized) (M62.81);Difficulty in walking, not elsewhere classified (R26.2)     Time: [id number] PT Time Calculation (min) (ACUTE ONLY): 18 min  Charges:                        Renaldo HarrisonStephen Arda Keadle, SPT    Renaldo HarrisonStephen Dow Blahnik 11/15/2017, 10:24 AM

## 2017-11-15 NOTE — Discharge Instructions (Signed)
In addition to included general post-operative instructions for Laparoscopic Cholecystectomy,  Diet: Resume home heart healthy diet (may prefer to start with lower fat foods to minimize loose BM's).   Activity: No heavy lifting >20 pounds (children, pets, laundry, garbage) or strenuous activity until follow-up, but light activity and walking are encouraged. Do not drive or drink alcohol if taking narcotic pain medications.  Wound care: You may shower/get incision wet with soapy water and pat dry (do not rub incisions), but no baths or submerging incision underwater until follow-up.   Medications: Resume all home medications. For mild to moderate pain: acetaminophen (Tylenol) or ibuprofen/naproxen (if no kidney disease). Combining Tylenol with alcohol can substantially increase your risk of causing liver disease. Narcotic pain medications, if prescribed, can be used for severe pain, though may cause nausea, constipation, and drowsiness. Do not combine Tylenol and Percocet (or similar) within a 6 hour period as Percocet (and similar) contain(s) Tylenol. If you do not need the narcotic pain medication, you do not need to fill the prescription.  Call office [phone]) at any time if any questions, worsening pain, fevers/chills, bleeding, drainage from incision site, or other concerns.

## 2017-11-15 NOTE — Plan of Care (Signed)
Patient not exhibiting any signs of anxiety. Will continue to monitor.

## 2017-11-15 NOTE — Care Management Note (Signed)
Case Management Note  Patient Details  Name: Brian Montes MRN: [id number] Date of Birth: 01-13-1946  Subjective/Objective:    Met with patient at bedside to discuss discharge disposition. He states he lives alone since his wife fell and is in rehab. Discussed setting up RN and PT. Patient refused. He stated he needed no home health services. He stated he had all the DME he needed.  He had plenty of people checking on him. He states he will call [phone] if he gets sick.  RNCM provided contact information should he change his mind or have other discharge needs.                 Action/Plan:   Expected Discharge Date:  11/03/17               Expected Discharge Plan:  Home/Self Care  In-House Referral:     Discharge planning Services  CM Consult  Post Acute Care Choice:    Choice offered to:  Patient  DME Arranged:    DME Agency:     HH Arranged:    [place] Agency:     Status of Service:  Completed, signed off  If discussed at [employer] of Stay Meetings, dates discussed:    Additional Comments:  Jolly Mango, RN 11/15/2017, 3:12 PM

## 2017-11-15 NOTE — Progress Notes (Addendum)
ADDENDUM: RUQ surgical drain and staples removed, dressings applied, discharge instructions and follow-up placed in chart.  -- Scherrie Gerlach Earlene Plater, MD, RPVI [place]: [facility] General Surgery - Partnering for exceptional care. Office: [phone]       SURGICAL PROGRESS NOTE  Hospital Day(s): 19.   Post op day(s): 13 Days Post-Op.   Interval History: Patient seen and examined, no acute events or new complaints overnight. Patient continues to report his abdomen feels softer and flatter with decreased abdominal distention, tolerating full liquids diet with +flatus and +BM's without N/V, denies fever/chills, CP, or SOB.  Review of Systems:  Constitutional: denies fever, chills  Respiratory: denies any shortness of breath  Cardiovascular: denies chest pain or palpitations  Gastrointestinal: abdominal pain, N/V, and bowel function as per interval history Musculoskeletal: denies pain, decreased motor or sensation Integumentary: denies any other rashes or skin discolorations  Vital signs in last 24 hours: [min-max] current  Temp:  [98 F (36.7 C)-98.5 F (36.9 C)] 98.5 F (36.9 C) (07/31 0804) Pulse Rate:  [86-95] 86 (07/31 0804) Resp:  [18-20] 18 (07/31 0804) BP: (103-129)/(63-71) 116/68 (07/31 0804) SpO2:  [93 %-99 %] 96 % (07/31 0804) Weight:  [173 lb 11.6 oz (78.8 kg)] 173 lb 11.6 oz (78.8 kg) (07/31 0500)     Height: 5\' 9"  (175.3 cm) Weight: 173 lb 11.6 oz (78.8 kg) BMI (Calculated): 25.64   Intake/Output this shift:  No intake/output data recorded.   Intake/Output last 2 shifts:  @IOLAST2SHIFTS @   Physical Exam:  Constitutional: alert, cooperative and no distress  Respiratory: breathing non-labored at rest  Cardiovascular: regular rate and sinus rhythm  Gastrointestinal: soft, non-tender, and less distended with post-surgical incisions well-approximated without any surrounding erythema or drainage, RUQ drain well-secured with minimal sero-sanguinous  fluid in tubing and bulb  Labs:  CBC Latest Ref Rng & Units 11/13/2017 11/10/2017 11/07/2017  WBC 3.8 - 10.6 K/uL 5.6 5.8 7.3  Hemoglobin 13.0 - 18.0 g/dL 0.9(W) 1.1(B) 10.4(L)  Hematocrit 40.0 - 52.0 % 23.8(L) 25.5(L) 29.7(L)  Platelets 150 - 440 K/uL 243 271 360   CMP Latest Ref Rng & Units 11/13/2017 11/10/2017 11/09/2017  Glucose 70 - 99 mg/dL 147(W) 95 295(A)  BUN 8 - 23 mg/dL 14 18 20   Creatinine 0.61 - 1.24 mg/dL 2.13 0.86 5.78(I)  Sodium 135 - 145 mmol/L 138 137 141  Potassium 3.5 - 5.1 mmol/L 3.9 3.8 3.2(L)  Chloride 98 - 111 mmol/L 107 107 109  CO2 22 - 32 mmol/L 25 26 28   Calcium 8.9 - 10.3 mg/dL 7.8(L) 7.6(L) 8.0(L)  Total Protein 6.5 - 8.1 g/dL 5.0(L) 4.8(L) -  Total Bilirubin 0.3 - 1.2 mg/dL 0.6 0.6 -  Alkaline Phos 38 - 126 U/L 146(H) 132(H) -  AST 15 - 41 U/L 37 34 -  ALT 0 - 44 U/L 29 24 -   Imaging studies: No new pertinent imaging studies   Assessment/Plan:(ICD-10's: K80.12) 72 y.o.malewithresolving prolongedpost-surgical ileuson TPN 13Days Post-Ops/p laparoscopic cholecystectomy with gel hand portfor severe acute on cronic calculous cholecystitis, complicated by pertinent comorbidities includingHTN, HLD, and what appears to be dementia vs delerium, s/p remote laparoscope-assisted Right colectomy ~15 years ago at [facility] midline laparotomy-type incisional scar.  - monitor abdominal exam and bowel function - advanced to soft diet with oral nutritional supplements             - stop TPN tonight, as discussed with medical physician and nutritionist - ambulation again encouraged, minimize narcotics  - will plan to  remove drain and some staples - medical management of comorbidities             - discharge planning for tomorrow - DVT prophylaxis  All of the above findings and recommendations were discussed with the patient andhismedical team, and all of patient's questions were answered to his  expressed satisfaction.  -- Scherrie GerlachJason E. Earlene Plateravis, MD, RPVI [place]: [facility] General Surgery - Partnering for exceptional care. Office: [phone]

## 2017-11-16 LAB — BASIC METABOLIC PANEL
Anion gap: 2 — ABNORMAL LOW (ref 5–15)
BUN: 13 mg/dL (ref 8–23)
CO2: 21 mmol/L — ABNORMAL LOW (ref 22–32)
Calcium: 6.4 mg/dL — CL (ref 8.9–10.3)
Chloride: 116 mmol/L — ABNORMAL HIGH (ref 98–111)
Creatinine, Ser: 0.61 mg/dL (ref 0.61–1.24)
GFR calc Af Amer: >60 mL/min (ref >60)
Glucose, Bld: 81 mg/dL (ref 70–99)
POTASSIUM: 3.8 mmol/L (ref 3.5–5.1)
Sodium: 139 mmol/L (ref 135–145)

## 2017-11-16 LAB — MAGNESIUM
MAGNESIUM: 1.5 mg/dL — AB (ref 1.7–2.4)
Magnesium: 2 mg/dL (ref 1.7–2.4)

## 2017-11-16 LAB — GLUCOSE, CAPILLARY
GLUCOSE-CAPILLARY: 88 mg/dL (ref 70–99)
GLUCOSE-CAPILLARY: 104 mg/dL — AB (ref 70–99)
Glucose-Capillary: 96 mg/dL (ref 70–99)

## 2017-11-16 LAB — ALBUMIN: ALBUMIN: 1.9 g/dL — AB (ref 3.5–5.0)

## 2017-11-16 LAB — PHOSPHORUS: Phosphorus: 3.3 mg/dL (ref 2.5–4.6)

## 2017-11-16 NOTE — Progress Notes (Addendum)
Rounding completed--patient asleep in supine position. No distress noted. Call bell within reach, bed in low position. Will continue to monitor.   @0421  Rounding completed. Patient asleep in supine position. No distress noted. Call bell within reach, bed in low position. Will continue to monitor.

## 2017-11-16 NOTE — Progress Notes (Signed)
PICC line removed per hospital policy. Pt tolerated well. Occlusive dressing applied.

## 2017-11-16 NOTE — Progress Notes (Signed)
Pt vomited large amount undigested food. States that he ate a large breakfast. Pt had large BM this am. Zofran administered as ordered. Dr.Sudini notified. Will continue to monitor.

## 2017-11-16 NOTE — Progress Notes (Signed)
Pt ambulating in hall. Denies nausea. Denies pain or discomfort. States that he is ready to go home. Dr. Elpidio AnisSudini aware and instructed to discharge pt home.

## 2017-11-16 NOTE — Progress Notes (Addendum)
CRITICAL VALUE ALERT  Critical Value:  Calcium 6.4  Date & Time Notied:  11/16/2017 @ 0435  Provider Notified: Dr. Marjie SkiffSridharan   Orders Received/Actions taken: Obtain ionized calcium level--further orders depending on results.

## 2017-11-17 LAB — PARATHYROID HORMONE, INTACT (NO CA): PTH: 50 pg/mL (ref 15–65)

## 2017-11-17 LAB — CALCIUM, IONIZED: Calcium, Ionized, Serum: 4.9 mg/dL (ref 4.5–5.6)

## 2017-11-17 LAB — VITAMIN D 25 HYDROXY (VIT D DEFICIENCY, FRACTURES): Vit D, 25-Hydroxy: 32.7 ng/mL (ref 30.0–100.0)

## 2017-11-23 ENCOUNTER — Encounter: Payer: Self-pay | Admitting: Surgery

## 2017-11-30 ENCOUNTER — Encounter

## 2017-11-30 ENCOUNTER — Ambulatory Visit: Payer: Medicare Other | Admitting: Cardiovascular Disease

## 2017-11-30 NOTE — Progress Notes (Deleted)
Cardiology Office Note   Date:  11/30/2017   ID:  Brian Montes, DOB 04/09/1946, MRN [id number]  [facility]  Cardiologist:  Lorine Bears*** Cheyane Ayon, MD   No chief complaint on file.     History of Present Illness: Brian Montes is a 72 y.o. male who presents for ***    Past Medical History:  Diagnosis Date  . Acid reflux   . Essential hypertension   . Hyperlipidemia due to dietary fat intake     Past Surgical History:  Procedure Laterality Date  . CARDIAC CATHETERIZATION  2010   Dr. Lady GaryFath - NORMAL CORONARIES  . CHOLECYSTECTOMY N/A 11/02/2017   Procedure: LAPAROSCOPIC CHOLECYSTECTOMY;  Surgeon: Ancil Linseyavis, Jason Evan, MD;  Location: [facility] ORS;  Service: General;  Laterality: N/A;  . [facility]     Current Outpatient Medications  Medication Sig Dispense Refill  . amitriptyline (ELAVIL) 50 MG tablet Take 50 mg by mouth at bedtime.     Marland Kitchen. aspirin EC 81 MG tablet Take 81 mg by mouth daily.    Marland Kitchen. atorvastatin (LIPITOR) 40 MG tablet Take 40 mg by mouth at bedtime.    . cephALEXin (KEFLEX) 500 MG capsule Take 1 capsule (500 mg total) by mouth every 12 (twelve) hours. 10 capsule 0  . chlorthalidone (HYGROTON) 25 MG tablet Take 25 mg by mouth 2 (two) times daily.    Marland Kitchen. doxazosin (CARDURA) 1 MG tablet Take 1 mg by mouth daily.    Marland Kitchen. FLUoxetine (PROZAC) 20 MG capsule Take 20 mg by mouth daily.    Marland Kitchen. lisinopril (PRINIVIL,ZESTRIL) 20 MG tablet Take 20 mg by mouth daily.    Marland Kitchen. omeprazole (PRILOSEC) 20 MG capsule Take 20 mg by mouth daily.    Marland Kitchen. senna (SENOKOT) 8.6 MG TABS tablet Take 1 tablet (8.6 mg total) by mouth daily. 120 each 0   No current facility-administered medications for this visit.     Allergies:   Codeine    Social History:  The patient  reports that he has quit smoking. His smokeless tobacco use includes chew. He reports that he drank alcohol. He reports that he does not use drugs.   Family History:  The patient's ***family  history includes Diabetes in his brother.    ROS:  Please see the history of present illness.   Otherwise, review of systems are positive for {NONE DEFAULTED:18576::"none"}.   All other systems are reviewed and negative.    PHYSICAL EXAM: VS:  There were no vitals taken for this visit. , BMI There is no height or weight on file to calculate BMI. GEN: Well nourished, well developed, in no acute distress  HEENT: normal  Neck: no JVD, carotid bruits, or masses Cardiac: ***RRR; no murmurs, rubs, or gallops,no edema  Respiratory:  clear to auscultation bilaterally, normal work of breathing GI: soft, nontender, nondistended, + BS MS: no deformity or atrophy  Skin: warm and dry, no rash Neuro:  Strength and sensation are intact Psych: euthymic mood, full affect   EKG:  EKG {ACTION; IS/IS [id number]}OT:[id number]} ordered today. The ekg ordered today demonstrates ***   Recent Labs: 11/13/2017: ALT 29; Hemoglobin 8.4; Platelets 243 11/16/2017: BUN 13; Creatinine, Ser 0.61; Magnesium 2.0; Potassium 3.8; Sodium 139    Lipid Panel    Component Value Date/Time   CHOL 89 10/29/2017 0407   TRIG 121 11/13/2017 0513   HDL 46 10/29/2017 0407   CHOLHDL 1.9 10/29/2017 0407  VLDL 13 10/29/2017 0407   LDLCALC 30 10/29/2017 0407      Wt Readings from Last 3 Encounters:  11/16/17 171 lb 15.3 oz (78 kg)      Other studies Reviewed: Additional studies/ records that were reviewed today include: ***. Review of the above records demonstrates: ***  No flowsheet data found.    ASSESSMENT AND PLAN:  1.  ***    Disposition:   FU with *** in {gen number 1-61:[id number]}0-10:310397} {Days to years:10300}  Signed,  Lorine BearsMuhammad Thi Klich, MD  11/30/2017 9:28 AM    [facility]

## 2017-12-01 ENCOUNTER — Encounter: Payer: Self-pay | Admitting: Cardiovascular Disease

## 2017-12-05 ENCOUNTER — Telehealth: Payer: Self-pay | Admitting: Surgery

## 2017-12-05 NOTE — Telephone Encounter (Signed)
Unable to leave a message for the patient to call the office. Patient no showed appointment with Dr. Earlene Plateravis.

## 2017-12-11 ENCOUNTER — Encounter: Payer: Self-pay | Admitting: Cardiovascular Disease

## 2017-12-14 NOTE — Telephone Encounter (Signed)
Left another message for the patient to call the office. °

## 2017-12-21 NOTE — Telephone Encounter (Signed)
Patients coming in on 01/02/18. °

## 2018-01-02 ENCOUNTER — Ambulatory Visit (INDEPENDENT_AMBULATORY_CARE_PROVIDER_SITE_OTHER): Payer: Medicare Other | Admitting: Surgery

## 2018-01-02 ENCOUNTER — Encounter: Payer: Self-pay | Admitting: Surgery

## 2018-01-02 VITALS — BP 142/95 | HR 134 | Temp 99.0°F | Resp 18 | Ht 69.0 in | Wt 174.8 lb

## 2018-01-02 DIAGNOSIS — K8012 Calculus of gallbladder with acute and chronic cholecystitis without obstruction: Secondary | ICD-10-CM

## 2018-01-02 DIAGNOSIS — Z4889 Encounter for other specified surgical aftercare: Secondary | ICD-10-CM

## 2018-01-02 NOTE — Patient Instructions (Signed)
You may resume your normal activities

## 2018-01-02 NOTE — Progress Notes (Signed)
Surgical Clinic Progress/Follow-up Note   HPI:  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 Patient reports complete resolution of pre-operative pain and has been tolerating regular diet with +flatus and normal BM's, denies N/V, fever/chills, CP, or SOB. Patient's hand port staples were removed prior to hospital discharge, and he says he forgot about his follow-up appointment until his son reminded him and called our office to reschedule delayed post-surgical follow-up appointment.  Review of Systems:  Constitutional: denies fever/chills  Respiratory: denies shortness of breath, wheezing  Cardiovascular: denies chest pain, palpitations  Gastrointestinal: abdominal pain, N/V, and bowel function as per interval history Skin: Denies any other rashes or skin discolorations except post-surgical wounds as per interval history  Vital Signs:  BP (!) 142/95   Pulse (!) 134   Temp 99 F (37.2 C) (Temporal)   Resp 18   Ht 5\' 9"  (1.753 m)   Wt 174 lb 12.8 oz (79.3 kg)   SpO2 99%   BMI 25.81 kg/m    Physical Exam:  Constitutional:  -- Normal body habitus  -- Awake, alert, and oriented x3  Pulmonary:  -- No crackles -- Equal breath sounds bilaterally -- Breathing non-labored at rest Cardiovascular:  -- S1, S2 present  -- No pericardial rubs  Gastrointestinal:  -- Soft and non-distended, non-tender to palpation, no guarding/rebound tenderness -- Post-surgical incisions all well-healing without any peri-incisional erythema or drainage -- No abdominal masses appreciated, pulsatile or otherwise  Musculoskeletal / Integumentary:  -- Wounds or skin discoloration: None appreciated except post-surgical incisions as described above (GI) -- Extremities: B/L UE and LE FROM, hands and feet warm, no edema   Assessment:  72 y.o. yo Male with a problem list  including...  Patient Active Problem List   Diagnosis Date Noted  . Calculus of gallbladder with acute and chronic cholecystitis without obstruction   . 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 Distended abdomen 10/29/2017  . Essential hypertension   . Hyperlipidemia due to dietary fat intake     presents to clinic for delayed post-op follow-up evaluation, doing well 2 months s/p laparoscopic cholecystectomy with gel hand port Earlene Plater([facility] [place], 11/02/2017) for severe acute on chronic cholecystitis.  Plan:              - heart-healthy diet as tolerated              - activities without restrictions okay to submerge incisions under water (baths, swimming) prn             - apply sunblock particularly to incisions with sun exposure to reduce pigmentation of scars             - return to clinic as needed, instructed to call office if any questions or concerns  All of the above recommendations were discussed with the patient, and all of patient's questions were answered to his expressed satisfaction.  -- Scherrie GerlachJason E. Earlene Plateravis, MD, RPVI [place]: [facility] General [facility] - Partnering for exceptional care. Office: [phone]

## 2019-04-29 ENCOUNTER — Other Ambulatory Visit: Payer: Self-pay

## 2019-04-29 ENCOUNTER — Encounter: Payer: Self-pay | Admitting: Emergency Medicine

## 2019-04-29 ENCOUNTER — Emergency Department: Payer: Medicare Other

## 2019-04-29 ENCOUNTER — Inpatient Hospital Stay
Admission: EM | Admit: 2019-04-29 | Discharge: 2019-04-30 | DRG: 177 | Disposition: A | Discharge status: Home / Self Care | Payer: Medicare Other | Attending: Internal Medicine | Admitting: Internal Medicine

## 2019-04-29 DIAGNOSIS — I10 Essential (primary) hypertension: Secondary | ICD-10-CM

## 2019-04-29 DIAGNOSIS — Z9049 Acquired absence of other specified parts of digestive tract: Secondary | ICD-10-CM | POA: Diagnosis not present

## 2019-04-29 DIAGNOSIS — T380X5A Adverse effect of glucocorticoids and synthetic analogues, initial encounter: Secondary | ICD-10-CM | POA: Diagnosis not present

## 2019-04-29 DIAGNOSIS — J9601 Acute respiratory failure with hypoxia: Secondary | ICD-10-CM | POA: Diagnosis present

## 2019-04-29 DIAGNOSIS — Z79899 Other long term (current) drug therapy: Secondary | ICD-10-CM | POA: Diagnosis not present

## 2019-04-29 DIAGNOSIS — U071 COVID-19: Secondary | ICD-10-CM | POA: Diagnosis present

## 2019-04-29 DIAGNOSIS — R7309 Other abnormal glucose: Secondary | ICD-10-CM | POA: Diagnosis not present

## 2019-04-29 DIAGNOSIS — R739 Hyperglycemia, unspecified: Secondary | ICD-10-CM | POA: Diagnosis not present

## 2019-04-29 DIAGNOSIS — K219 Gastro-esophageal reflux disease without esophagitis: Secondary | ICD-10-CM | POA: Diagnosis present

## 2019-04-29 DIAGNOSIS — E785 Hyperlipidemia, unspecified: Secondary | ICD-10-CM | POA: Diagnosis present

## 2019-04-29 DIAGNOSIS — D696 Thrombocytopenia, unspecified: Secondary | ICD-10-CM | POA: Diagnosis present

## 2019-04-29 DIAGNOSIS — Z720 Tobacco use: Secondary | ICD-10-CM | POA: Diagnosis not present

## 2019-04-29 DIAGNOSIS — Z7982 Long term (current) use of aspirin: Secondary | ICD-10-CM | POA: Diagnosis not present

## 2019-04-29 LAB — BASIC METABOLIC PANEL
Anion gap: 8 (ref 5–15)
BUN: 14 mg/dL (ref 8–23)
CO2: 23 mmol/L (ref 22–32)
Calcium: 8.4 mg/dL — ABNORMAL LOW (ref 8.9–10.3)
Chloride: 103 mmol/L (ref 98–111)
Creatinine, Ser: 1.1 mg/dL (ref 0.61–1.24)
GFR calc Af Amer: >60 mL/min (ref >60)
GFR calc non Af Amer: >60 mL/min (ref >60)
Glucose, Bld: 96 mg/dL (ref 70–99)
Potassium: 4.1 mmol/L (ref 3.5–5.1)
Sodium: 134 mmol/L — ABNORMAL LOW (ref 135–145)

## 2019-04-29 LAB — CBC
HCT: 36.2 % — ABNORMAL LOW (ref 39.0–52.0)
Hemoglobin: 12 g/dL — ABNORMAL LOW (ref 13.0–17.0)
MCH: 31.3 pg (ref 26.0–34.0)
MCHC: 33.1 g/dL (ref 30.0–36.0)
MCV: 94.3 fL (ref 80.0–100.0)
Platelets: 143 10*3/uL — ABNORMAL LOW (ref 150–400)
RBC: 3.84 MIL/uL — ABNORMAL LOW (ref 4.22–5.81)
RDW: 13 % (ref 11.5–15.5)
WBC: 5.6 10*3/uL (ref 4.0–10.5)
nRBC: 0 % (ref 0.0–0.2)

## 2019-04-29 LAB — FIBRIN DERIVATIVES D-DIMER (ARMC ONLY): Fibrin derivatives D-dimer (ARMC): 630.49 ng/mL (FEU) — ABNORMAL HIGH (ref 0.00–499.00)

## 2019-04-29 LAB — HEPATIC FUNCTION PANEL
ALT: 20 U/L (ref 0–44)
AST: 34 U/L (ref 15–41)
Albumin: 3.3 g/dL — ABNORMAL LOW (ref 3.5–5.0)
Alkaline Phosphatase: 94 U/L (ref 38–126)
Bilirubin, Direct: 0.4 mg/dL — ABNORMAL HIGH (ref 0.0–0.2)
Indirect Bilirubin: 1 mg/dL — ABNORMAL HIGH (ref 0.3–0.9)
Total Bilirubin: 1.4 mg/dL — ABNORMAL HIGH (ref 0.3–1.2)
Total Protein: 7.1 g/dL (ref 6.5–8.1)

## 2019-04-29 LAB — RESPIRATORY PANEL BY RT PCR (FLU A&B, COVID)
Influenza A by PCR: NEGATIVE
Influenza B by PCR: NEGATIVE
SARS Coronavirus 2 by RT PCR: POSITIVE — AB

## 2019-04-29 LAB — TRIGLYCERIDES: Triglycerides: 101 mg/dL (ref <150)

## 2019-04-29 LAB — LIPASE, BLOOD: Lipase: 29 U/L (ref 11–51)

## 2019-04-29 LAB — PROCALCITONIN: Procalcitonin: <0.1 ng/mL

## 2019-04-29 LAB — TROPONIN I (HIGH SENSITIVITY)
Troponin I (High Sensitivity): 8 ng/L (ref <18)
Troponin I (High Sensitivity): 5 ng/L (ref <18)

## 2019-04-29 LAB — LACTATE DEHYDROGENASE: LDH: 225 U/L — ABNORMAL HIGH (ref 98–192)

## 2019-04-29 LAB — FERRITIN: Ferritin: 183 ng/mL (ref 24–336)

## 2019-04-29 LAB — FIBRINOGEN: Fibrinogen: 580 mg/dL — ABNORMAL HIGH (ref 210–475)

## 2019-04-29 MED ORDER — ACETAMINOPHEN 325 MG PO TABS: 650.0000 mg | ORAL_TABLET | Freq: Four times a day (QID) | ORAL | Status: DC | PRN

## 2019-04-29 MED ORDER — CHLORTHALIDONE 25 MG PO TABS
25.0000 mg | ORAL_TABLET | Freq: Every day | ORAL | Status: DC
  Filled 2019-04-29: qty 1

## 2019-04-29 MED ORDER — ONDANSETRON HCL 4 MG/2ML IJ SOLN: 4.0000 mg | Freq: Four times a day (QID) | INTRAMUSCULAR | Status: DC | PRN

## 2019-04-29 MED ORDER — DOXAZOSIN MESYLATE 1 MG PO TABS
1.0000 mg | ORAL_TABLET | Freq: Every day | ORAL | Status: DC
  Administered 2019-04-30: 1 mg via ORAL
  Filled 2019-04-29: qty 1

## 2019-04-29 MED ORDER — DEXAMETHASONE SODIUM PHOSPHATE 10 MG/ML IJ SOLN
6.0000 mg | INTRAMUSCULAR | Status: DC
  Administered 2019-04-30: 08:00:00 6 mg via INTRAVENOUS
  Filled 2019-04-29 (×2): qty 1

## 2019-04-29 MED ORDER — ONDANSETRON HCL 4 MG PO TABS: 4.0000 mg | ORAL_TABLET | Freq: Four times a day (QID) | ORAL | Status: DC | PRN

## 2019-04-29 MED ORDER — PANTOPRAZOLE SODIUM 40 MG PO TBEC
40.0000 mg | DELAYED_RELEASE_TABLET | Freq: Every day | ORAL | Status: DC
  Administered 2019-04-30: 10:00:00 40 mg via ORAL
  Filled 2019-04-29: qty 1

## 2019-04-29 MED ORDER — LISINOPRIL 10 MG PO TABS
20.0000 mg | ORAL_TABLET | Freq: Every day | ORAL | Status: DC
  Administered 2019-04-30: 10:00:00 20 mg via ORAL
  Filled 2019-04-29: qty 2

## 2019-04-29 MED ORDER — ASPIRIN EC 81 MG PO TBEC
81.0000 mg | DELAYED_RELEASE_TABLET | Freq: Every day | ORAL | Status: DC
  Administered 2019-04-30: 81 mg via ORAL
  Filled 2019-04-29: qty 1

## 2019-04-29 MED ORDER — ATORVASTATIN CALCIUM 20 MG PO TABS: 40.0000 mg | ORAL_TABLET | Freq: Every day | ORAL | Status: DC

## 2019-04-29 MED ORDER — FLUOXETINE HCL 20 MG PO CAPS
20.0000 mg | ORAL_CAPSULE | Freq: Every day | ORAL | Status: DC
  Administered 2019-04-30: 20 mg via ORAL
  Filled 2019-04-29: qty 1

## 2019-04-29 MED ORDER — SODIUM CHLORIDE 0.9 % IV SOLN
100.0000 mg | Freq: Every day | INTRAVENOUS | Status: DC
  Administered 2019-04-30: 100 mg via INTRAVENOUS
  Filled 2019-04-29: qty 100

## 2019-04-29 MED ORDER — AMITRIPTYLINE HCL 50 MG PO TABS: 50.0000 mg | ORAL_TABLET | Freq: Every day | ORAL | Status: DC

## 2019-04-29 MED ORDER — IPRATROPIUM-ALBUTEROL 20-100 MCG/ACT IN AERS
1.0000 | INHALATION_SPRAY | Freq: Four times a day (QID) | RESPIRATORY_TRACT | Status: DC
  Administered 2019-04-29 – 2019-04-30 (×3): 1 via RESPIRATORY_TRACT
  Filled 2019-04-29: qty 4

## 2019-04-29 MED ORDER — ZINC SULFATE 220 (50 ZN) MG PO CAPS
220.0000 mg | ORAL_CAPSULE | Freq: Every day | ORAL | Status: DC
  Administered 2019-04-29 – 2019-04-30 (×2): 220 mg via ORAL
  Filled 2019-04-29 (×2): qty 1

## 2019-04-29 MED ORDER — DEXAMETHASONE SODIUM PHOSPHATE 10 MG/ML IJ SOLN
6.0000 mg | Freq: Once | INTRAMUSCULAR | Status: AC
  Administered 2019-04-29: 18:00:00 6 mg via INTRAVENOUS
  Filled 2019-04-29: qty 1

## 2019-04-29 MED ORDER — SODIUM CHLORIDE 0.9% FLUSH: 3.0000 mL | Freq: Once | INTRAVENOUS | Status: DC

## 2019-04-29 MED ORDER — ASCORBIC ACID 500 MG PO TABS
500.0000 mg | ORAL_TABLET | Freq: Every day | ORAL | Status: DC
  Administered 2019-04-29 – 2019-04-30 (×2): 500 mg via ORAL
  Filled 2019-04-29 (×2): qty 1

## 2019-04-29 MED ORDER — SODIUM CHLORIDE 0.9 % IV SOLN
200.0000 mg | Freq: Once | INTRAVENOUS | Status: AC
  Administered 2019-04-29: 200 mg via INTRAVENOUS
  Filled 2019-04-29: qty 200

## 2019-04-29 MED ORDER — ENOXAPARIN SODIUM 40 MG/0.4ML ~~LOC~~ SOLN
40.0000 mg | SUBCUTANEOUS | Status: DC
  Filled 2019-04-29: qty 0.4

## 2019-04-29 NOTE — ED Notes (Signed)
Pt ambulated with Dr. Fuller Plan at bedside, pt walked around room a few times and became increasingly shob, O2 sat went from 100% to 86% on ambulation.

## 2019-04-29 NOTE — H&P (Signed)
[facility] [place] at [facility]   PATIENT NAME: Brian Montes    MR#:  [id number]  DATE OF BIRTH:  [birth year]  DATE OF ADMISSION:  04/29/2019  PRIMARY CARE PHYSICIAN: Center, [facility]   REQUESTING/REFERRING PHYSICIAN: Dr Artis Delay  CHIEF COMPLAINT:   Chief Complaint  Patient presents with  . Shortness of Breath  . Weakness    HISTORY OF PRESENT ILLNESS:  Jaser Fullen  is a 74 y.o. male presents to the ER with not feeling well for a few days really bad for 2 days.  He is short of breath when he walks around.  Some cough.  Some chest congestion.  Patient did have some nausea vomiting and diarrhea.  Some abdominal discomfort.  He is just not feeling well.  He came to the ER for further evaluation.  In the ER, when he walked around his pulse ox dropped down to 86% and he was short of breath.  The lab called the nurse in the ER and told them that his Covid test was positive but I do not see the results currently in the computer.  PAST MEDICAL HISTORY:   Past Medical History:  Diagnosis Date  . Acid reflux   . Calculus of gallbladder with acute and chronic cholecystitis without obstruction   . Essential hypertension   . Hyperlipidemia due to dietary fat intake     PAST SURGICAL HISTORY:   Past Surgical History:  Procedure Laterality Date  . CARDIAC CATHETERIZATION  2010   Dr. Lady Gary - NORMAL CORONARIES  . CHOLECYSTECTOMY N/A 11/02/2017   Procedure: LAPAROSCOPIC CHOLECYSTECTOMY;  Surgeon: Ancil Linsey, MD;  Location: [facility] ORS;  Service: General;  Laterality: N/A;  . RIGHT COLECTOMY  2009   at [facility]    SOCIAL HISTORY:   Social History   Tobacco Use  . Smoking status: Former Games developer  . Smokeless tobacco: Current User    Types: Chew  Substance Use Topics  . Alcohol use: Not Currently    FAMILY HISTORY:   Family History  Problem Relation Age of Onset  . Diabetes Brother   . CAD Father     DRUG ALLERGIES:   Allergies  Allergen  Reactions  . Codeine Other (See Comments)    REVIEW OF SYSTEMS:  CONSTITUTIONAL: No fever, positive chills.  Positive for fatigue.  EYES: No blurred or double vision.  EARS, NOSE, AND THROAT: No tinnitus or ear pain. No sore throat RESPIRATORY: Positive for cough.positive for shortness of breath.  No wheezing or hemoptysis.  CARDIOVASCULAR: No chest pain, orthopnea, edema.  GASTROINTESTINAL: Positive for nausea, vomiting, and diarrhea.  Some abdominal pain. No blood in bowel movements GENITOURINARY: No dysuria, hematuria.  ENDOCRINE: No polyuria, nocturia,  HEMATOLOGY: No anemia, easy bruising or bleeding SKIN: No rash or lesion. MUSCULOSKELETAL: No joint pain or arthritis.   NEUROLOGIC: No tingling, numbness, weakness.  PSYCHIATRY: No anxiety or depression.   MEDICATIONS AT HOME:   Prior to Admission medications   Medication Sig Start Date End Date Taking? Authorizing Provider  amitriptyline (ELAVIL) 50 MG tablet Take 50 mg by mouth at bedtime.  10/09/17   [provider]  aspirin EC 81 MG tablet Take 81 mg by mouth daily.    [provider]  atorvastatin (LIPITOR) 40 MG tablet Take 40 mg by mouth at bedtime. 08/15/17   [provider]  chlorthalidone (HYGROTON) 25 MG tablet Take 25 mg by mouth 2 (two) times daily. 10/10/17   [provider]  doxazosin (CARDURA) 1 MG tablet Take 1 mg by mouth daily. 10/25/17   [provider]  FLUoxetine (PROZAC) 20 MG capsule Take 20 mg by mouth daily.    [provider]  lisinopril (PRINIVIL,ZESTRIL) 20 MG tablet Take 20 mg by mouth daily. 09/12/17   [provider]  omeprazole (PRILOSEC) 20 MG capsule Take 20 mg by mouth daily. 10/09/17   [provider]  senna (SENOKOT) 8.6 MG TABS tablet Take 1 tablet (8.6 mg total) by mouth daily. 11/04/17   Katha Hamming, MD   Medication reconciliation still undergoing  VITAL SIGNS:  Blood pressure (!) 143/65, pulse (!) 55, temperature  98.3 F (36.8 C), temperature source Oral, resp. rate 19, height 6' (1.829 m), weight 82.1 kg, SpO2 99 %.  PHYSICAL EXAMINATION:  GENERAL:  74 y.o.-year-old patient lying in the bed with no acute distress.  EYES: Pupils equal, round, reactive to light and accommodation. No scleral icterus. Extraocular muscles intact.  HEENT: Head atraumatic, normocephalic. Oropharynx and nasopharynx clear.  NECK:  Supple, no jugular venous distention. No thyroid enlargement, no tenderness.  LUNGS: Decreased breath sounds bilaterally, no wheezing, rales,rhonchi or crepitation. No use of accessory muscles of respiration.  CARDIOVASCULAR: S1, S2 normal. No murmurs, rubs, or gallops.  ABDOMEN: Soft, nontender, nondistended. Bowel sounds present. No organomegaly or mass.  EXTREMITIES: Trace pedal edema.  No cyanosis, or clubbing.  NEUROLOGIC: Cranial nerves II through XII are intact. Muscle strength 5/5 in all extremities. Sensation intact. Gait not checked.  PSYCHIATRIC: The patient is alert and oriented x 3.  SKIN: No rash, lesion, or ulcer.   LABORATORY PANEL:   CBC Recent Labs  Lab 04/29/19 1249  WBC 5.6  HGB 12.0*  HCT 36.2*  PLT 143*   ------------------------------------------------------------------------------------------------------------------  Chemistries  Recent Labs  Lab 04/29/19 1249 04/29/19 1300  NA 134*  --   K 4.1  --   CL 103  --   CO2 23  --   GLUCOSE 96  --   BUN 14  --   CREATININE 1.10  --   CALCIUM 8.4*  --   AST  --  34  ALT  --  20  ALKPHOS  --  94  BILITOT  --  1.4*   ------------------------------------------------------------------------------------------------------------------  Cardiac Enzymes High-sensitivity troponin 8  RADIOLOGY:  DG Chest 2 View  Result Date: 04/29/2019 CLINICAL DATA:  Weakness, fatigue, chills, shortness of breath and fever. EXAM: CHEST - 2 VIEW COMPARISON:  PA and lateral chest 10/27/2017. FINDINGS: Trace bilateral pleural  effusions are seen. Lungs are clear. Heart size is upper normal. No pneumothorax. No acute or focal bony abnormality. IMPRESSION: Tiny bilateral pleural effusions.  Otherwise negative. Electronically Signed   By: Drusilla Kanner M.D.   On: 04/29/2019 14:15    EKG:   EKG interpreted by me sinus bradycardia 57 bpm, right bundle branch block  IMPRESSION AND PLAN:   1.  Acute hypoxic respiratory failure with ambulation secondary to suspected COVID-19 infection.  Lab called with positive Covid test to nurse.  I do not see the results yet in the computer.  Start Decadron, albuterol inhaler, vitamin C and zinc.  Remdesivir to be started once Covid test confirmed positive. 2.  Essential hypertension.  On Hygroton, lisinopril, Cardura 3.  Hyperlipidemia unspecified on Lipitor 4.  GERD on Prilosec 5.  Thrombocytopenia.  Likely with viral infection   All the records are reviewed and case discussed with ED provider. Management plans discussed with the patient, family and they  are in agreement.  CODE STATUS: full code  TOTAL TIME TAKING CARE OF THIS PATIENT: 50 minutes.    Loletha Grayer M.D on 04/29/2019 at 5:21 PM  Between 7am to 6pm - Pager - [phone]  After 6pm call admission pager [phone]  [facility]  CC: Primary care physician; Center, [facility]

## 2019-04-29 NOTE — Progress Notes (Signed)
Remdesivir - Pharmacy Brief Note   O:  ALT: 20 CXR: Tiny bilateral pleural effusions.  Otherwise negative SpO2: not on O2, but desat when ambulating   A/P:  Remdesivir 200 mg IVPB once followed by 100 mg IVPB daily x 4 days.   Laureen Ochs, PharmD 04/29/2019 5:00 PM

## 2019-04-29 NOTE — ED Provider Notes (Signed)
[facility] Emergency Department Provider Note  ____________________________________________   First MD Initiated Contact with Patient 04/29/19 1454     (approximate)  I have reviewed the triage vital signs and the nursing notes.   HISTORY  Chief Complaint Shortness of Breath and Weakness    HPI Brian Montes is a 74 y.o. male with HTN and HLD who comes in with SOB and weakness. Pt states he just has not been feeling well for about 1 week. He has sob and weakness with ambulation. SOB is intermittent, better at rest, worse with ambulation. No chest pain with it.  Occasional nausea and cough as well. Some loss of taste and smell. No known contacts of coronavirus. He lives alone at home.           Past Medical History:  Diagnosis Date  . Acid reflux   . Calculus of gallbladder with acute and chronic cholecystitis without obstruction   . Essential hypertension   . Hyperlipidemia due to dietary fat intake     Patient Active Problem List   Diagnosis Date Noted  . Essential hypertension   . Hyperlipidemia due to dietary fat intake     Past Surgical History:  Procedure Laterality Date  . CARDIAC CATHETERIZATION  2010   Dr. Ubaldo Glassing - NORMAL CORONARIES  . CHOLECYSTECTOMY N/A 11/02/2017   Procedure: LAPAROSCOPIC CHOLECYSTECTOMY;  Surgeon: Vickie Epley, MD;  Location: [facility] ORS;  Service: General;  Laterality: N/A;  . RIGHT COLECTOMY  2009   at [facility]    Prior to Admission medications   Medication Sig Start Date End Date Taking? Authorizing Provider  amitriptyline (ELAVIL) 50 MG tablet Take 50 mg by mouth at bedtime.  10/09/17   [provider]  aspirin EC 81 MG tablet Take 81 mg by mouth daily.    [provider]  atorvastatin (LIPITOR) 40 MG tablet Take 40 mg by mouth at bedtime. 08/15/17   [provider]  chlorthalidone (HYGROTON) 25 MG tablet Take 25 mg by mouth 2 (two) times daily. 10/10/17   [provider]    doxazosin (CARDURA) 1 MG tablet Take 1 mg by mouth daily. 10/25/17   [provider]  FLUoxetine (PROZAC) 20 MG capsule Take 20 mg by mouth daily.    [provider]  lisinopril (PRINIVIL,ZESTRIL) 20 MG tablet Take 20 mg by mouth daily. 09/12/17   [provider]  omeprazole (PRILOSEC) 20 MG capsule Take 20 mg by mouth daily. 10/09/17   [provider]  senna (SENOKOT) 8.6 MG TABS tablet Take 1 tablet (8.6 mg total) by mouth daily. 11/04/17   Epifanio Lesches, MD    Allergies Codeine  Family History  Problem Relation Age of Onset  . Diabetes Brother     Social History Social History   Tobacco Use  . Smoking status: Former Research scientist (life sciences)  . Smokeless tobacco: Current User    Types: Chew  Substance Use Topics  . Alcohol use: Not Currently  . Drug use: Never      Review of Systems Constitutional: No fever/chills, positive for weakness Eyes: No visual changes. ENT: No sore throat. Cardiovascular: No chest pain Respiratory: Positive for SOB Gastrointestinal: No abdominal pain.  No nausea, no vomiting.  No diarrhea.  No constipation. Genitourinary: Negative for dysuria. Musculoskeletal: Negative for back pain. Skin: Negative for rash. Neurological: Negative for headaches, focal weakness or numbness. All other ROS negative ____________________________________________   PHYSICAL EXAM:  VITAL SIGNS: ED Triage Vitals  Enc Vitals  Group     BP 04/29/19 1241 (!) 143/75     Pulse Rate 04/29/19 1241 (!) 58     Resp 04/29/19 1241 16     Temp 04/29/19 1241 98.3 F (36.8 C)     Temp Source 04/29/19 1241 Oral     SpO2 04/29/19 1241 99 %     Weight 04/29/19 1244 181 lb (82.1 kg)     Height 04/29/19 1244 6' (1.829 m)     Head Circumference --      Peak Flow --      Pain Score 04/29/19 1246 0     Pain Loc --      Pain Edu? --      Excl. in GC? --     Constitutional: Alert and oriented. Well appearing and in no acute distress. Eyes:  Conjunctivae are normal. EOMI. Head: Atraumatic. Nose: No congestion/rhinnorhea. Mouth/Throat: Mucous membranes are moist.   Neck: No stridor. Trachea Midline. FROM Cardiovascular: Normal rate, regular rhythm. Grossly normal heart sounds.  Good peripheral circulation. Respiratory: No increased work of breathing at rest but with ambulation has increased work of breathing and desats to 86% Gastrointestinal: Soft and nontender. No distention. No abdominal bruits.  Musculoskeletal: No lower extremity tenderness nor edema.  No joint effusions. Neurologic:  Normal speech and language. No gross focal neurologic deficits are appreciated.  Skin:  Skin is warm, dry and intact. No rash noted. Psychiatric: Mood and affect are normal. Speech and behavior are normal. GU: Deferred   ____________________________________________   LABS (all labs ordered are listed, but only abnormal results are displayed)  Labs Reviewed  BASIC METABOLIC PANEL - Abnormal; Notable for the following components:      Result Value   Sodium 134 (*)    Calcium 8.4 (*)    All other components within normal limits  CBC - Abnormal; Notable for the following components:   RBC 3.84 (*)    Hemoglobin 12.0 (*)    HCT 36.2 (*)    Platelets 143 (*)    All other components within normal limits  HEPATIC FUNCTION PANEL - Abnormal; Notable for the following components:   Albumin 3.3 (*)    Total Bilirubin 1.4 (*)    Bilirubin, Direct 0.4 (*)    Indirect Bilirubin 1.0 (*)    All other components within normal limits  RESPIRATORY PANEL BY RT PCR (FLU A&B, COVID)  LIPASE, BLOOD  URINALYSIS, COMPLETE (UACMP) WITH MICROSCOPIC  CBG MONITORING, ED  TROPONIN I (HIGH SENSITIVITY)   ____________________________________________   ED ECG REPORT I, Concha Se, the attending physician, personally viewed and interpreted this ECG.  Sinus bradycardia rate of 57, no st elevation, no twi, RBBB.  Unchanged RBBB from prior EKG.    ____________________________________________  RADIOLOGY Vela Prose, personally viewed and evaluated these images (plain radiographs) as part of my medical decision making, as well as reviewing the written report by the radiologist.  ED MD interpretation:  No focal consolidation   Official radiology report(s): DG Chest 2 View  Result Date: 04/29/2019 CLINICAL DATA:  Weakness, fatigue, chills, shortness of breath and fever. EXAM: CHEST - 2 VIEW COMPARISON:  PA and lateral chest 10/27/2017. FINDINGS: Trace bilateral pleural effusions are seen. Lungs are clear. Heart size is upper normal. No pneumothorax. No acute or focal bony abnormality. IMPRESSION: Tiny bilateral pleural effusions.  Otherwise negative. Electronically Signed   By: Drusilla Kanner M.D.   On: 04/29/2019 14:15    ____________________________________________   PROCEDURES  Procedure(s) performed (including Critical Care):  .Critical Care Performed by: Concha Se, MD Authorized by: Concha Se, MD   Critical care provider statement:    Critical care time (minutes):  31   Critical care was necessary to treat or prevent imminent or life-threatening deterioration of the following conditions:  Respiratory failure   Critical care was time spent personally by me on the following activities:  Discussions with consultants, evaluation of patient's response to treatment, examination of patient, ordering and performing treatments and interventions, ordering and review of laboratory studies, ordering and review of radiographic studies, pulse oximetry, re-evaluation of patient's condition, obtaining history from patient or surrogate and review of old charts     ____________________________________________   INITIAL IMPRESSION / ASSESSMENT AND PLAN / ED COURSE   Brian Montes was evaluated in Emergency Department on 04/29/2019 for the symptoms described in the history of present illness. He was evaluated in the context  of the global COVID-19 pandemic, which necessitated consideration that the patient might be at risk for infection with the SARS-CoV-2 virus that causes COVID-19. Institutional protocols and algorithms that pertain to the evaluation of patients at risk for COVID-19 are in a state of rapid change based on information released by regulatory bodies including the CDC and federal and state organizations. These policies and algorithms were followed during the patient's care in the ED.     Pt presents with SOB.  This is most concerning for coronavirus.  Will get swab to further evaluate.  PNA-will get xray to evaluation Anemia-CBC to evaluate ACS- will get trops Arrhythmia-Will get EKG and keep on monitor.  PE-lower suspicion given no risk factors and other cause more likely  Bilirubin slightly elevated but patient is got no right upper quadrant tenderness at this time.  He already has had his gallbladder removed.  Ambulated patient and he desats down to 86% with ambulation.  Patient looks extremely short of breath after only a few steps.  Patient lives at home by himself.  Will need to discuss with the hospital team for admission.           ____________________________________________   FINAL CLINICAL IMPRESSION(S) / ED DIAGNOSES   Final diagnoses:  COVID-19  Acute respiratory failure with hypoxia (HCC)     MEDICATIONS GIVEN DURING THIS VISIT:  Medications  sodium chloride flush (NS) 0.9 % injection 3 mL (3 mLs Intravenous Not Given 04/29/19 1438)     ED Discharge Orders    None       Note:  This document was prepared using Dragon voice recognition software and may include unintentional dictation errors.   Concha Se, MD 04/29/19 1640

## 2019-04-29 NOTE — ED Notes (Signed)
Pt states that he has been feeling really bad for a weak with shob, diarrhea, and a strong cough where he can't cough up mucous.

## 2019-04-29 NOTE — ED Triage Notes (Signed)
Pt to ED via POV c/o weakness, fatigue, chills, fever, N/V, shortness of breath, and decreased appetite. Pt states that he has had decrease in taste and smell. Pt is in NAD. Denies COVID exposure.

## 2019-04-29 NOTE — ED Notes (Signed)
Pt ambulated with pulse ox per Dr. Fuller Plan. After walking in room for a few minutes, pt became very short of breath and o2 level went from 98% to 95% and then to 93%. Pt feeling short of breath in bed now, but O2 sat went back up to 100%. Dr. Fuller Plan notified.

## 2019-04-29 NOTE — ED Notes (Signed)
First Nurse Note: Pt to ED via POV for generalized weakness. Pt son states that pt has been giving out of breath and has not been eating. Pt is in NAD.

## 2019-04-30 DIAGNOSIS — R7309 Other abnormal glucose: Secondary | ICD-10-CM

## 2019-04-30 LAB — C-REACTIVE PROTEIN
CRP: 9.3 mg/dL — ABNORMAL HIGH
CRP: 11.2 mg/dL — ABNORMAL HIGH (ref <1.0)

## 2019-04-30 LAB — CBC WITH DIFFERENTIAL/PLATELET
Abs Immature Granulocytes: 0.02 10*3/uL (ref 0.00–0.07)
Basophils Absolute: 0 10*3/uL (ref 0.0–0.1)
Basophils Relative: 0 %
Eosinophils Absolute: 0 10*3/uL (ref 0.0–0.5)
Eosinophils Relative: 0 %
HCT: 35.1 % — ABNORMAL LOW (ref 39.0–52.0)
Hemoglobin: 11.6 g/dL — ABNORMAL LOW (ref 13.0–17.0)
Immature Granulocytes: 1 %
Lymphocytes Relative: 27 %
Lymphs Abs: 0.6 10*3/uL — ABNORMAL LOW (ref 0.7–4.0)
MCH: 31.4 pg (ref 26.0–34.0)
MCHC: 33 g/dL (ref 30.0–36.0)
MCV: 94.9 fL (ref 80.0–100.0)
Monocytes Absolute: 0.1 10*3/uL (ref 0.1–1.0)
Monocytes Relative: 7 %
Neutro Abs: 1.4 10*3/uL — ABNORMAL LOW (ref 1.7–7.7)
Neutrophils Relative %: 65 %
Platelets: 135 10*3/uL — ABNORMAL LOW (ref 150–400)
RBC: 3.7 MIL/uL — ABNORMAL LOW (ref 4.22–5.81)
RDW: 13 % (ref 11.5–15.5)
WBC: 2.2 10*3/uL — ABNORMAL LOW (ref 4.0–10.5)
nRBC: 0 % (ref 0.0–0.2)

## 2019-04-30 LAB — FIBRIN DERIVATIVES D-DIMER (ARMC ONLY): Fibrin derivatives D-dimer (ARMC): 737.51 ng/mL (FEU) — ABNORMAL HIGH (ref 0.00–499.00)

## 2019-04-30 LAB — COMPREHENSIVE METABOLIC PANEL
ALT: 21 U/L (ref 0–44)
AST: 27 U/L (ref 15–41)
Albumin: 3.1 g/dL — ABNORMAL LOW (ref 3.5–5.0)
Alkaline Phosphatase: 96 U/L (ref 38–126)
Anion gap: 6 (ref 5–15)
BUN: 18 mg/dL (ref 8–23)
CO2: 23 mmol/L (ref 22–32)
Calcium: 8.5 mg/dL — ABNORMAL LOW (ref 8.9–10.3)
Chloride: 107 mmol/L (ref 98–111)
Creatinine, Ser: 1 mg/dL (ref 0.61–1.24)
GFR calc Af Amer: >60 mL/min (ref >60)
GFR calc non Af Amer: >60 mL/min (ref >60)
Glucose, Bld: 155 mg/dL — ABNORMAL HIGH (ref 70–99)
Potassium: 4.5 mmol/L (ref 3.5–5.1)
Sodium: 136 mmol/L (ref 135–145)
Total Bilirubin: 1 mg/dL (ref 0.3–1.2)
Total Protein: 7.1 g/dL (ref 6.5–8.1)

## 2019-04-30 LAB — FERRITIN: Ferritin: 197 ng/mL (ref 24–336)

## 2019-04-30 MED ORDER — ZINC SULFATE 220 (50 ZN) MG PO CAPS: 220.0000 mg | ORAL_CAPSULE | Freq: Every day | ORAL | 0 refills | Status: DC

## 2019-04-30 MED ORDER — CHLORTHALIDONE 25 MG PO TABS: 25.0000 mg | ORAL_TABLET | Freq: Every day | ORAL | Status: DC

## 2019-04-30 MED ORDER — ASCORBIC ACID 500 MG PO TABS: 500.0000 mg | ORAL_TABLET | Freq: Every day | ORAL | 0 refills | Status: DC

## 2019-04-30 MED ORDER — FLUTICASONE PROPIONATE HFA 110 MCG/ACT IN AERO: INHALATION_SPRAY | RESPIRATORY_TRACT | 0 refills | Status: DC

## 2019-04-30 MED ORDER — DEXAMETHASONE 2 MG PO TABS: ORAL_TABLET | ORAL | 8 refills | Status: DC

## 2019-04-30 MED ORDER — IPRATROPIUM-ALBUTEROL 20-100 MCG/ACT IN AERS: 1.0000 | INHALATION_SPRAY | Freq: Four times a day (QID) | RESPIRATORY_TRACT | 0 refills | Status: DC

## 2019-04-30 NOTE — ED Notes (Signed)
Pt ambulatory to the bathroom with assistance. Pt states no increased SOB.

## 2019-04-30 NOTE — Evaluation (Signed)
Physical Therapy Evaluation Patient Details Name: Brian Montes MRN: [id number] DOB: [birth year] Today's Date: 04/30/2019   History of Present Illness  74 yo male presents to ED with not feeling well for a few days really bad for 2 days, SOB, weakness, some cough and chest congestion. Covid positive. PMH includes HTN, HLD  Clinical Impression  Pt is a 74 yo male evaluated in ED for above. Pt reports living alone but his son is nearby and he can call him for assistance as needed. Pt reports being independent with all ADLs and that his son helps with finances. Pt denies any recent falls. Pt received sitting EOB reporting no difficulty getting out of gurney. Pt performed OOB mobility within room without AD and min guard assist. Pt ambulated in room with multiple instances of unsteadiness and LOB with pt able to self correct with stepping response and reaching out for furniture. Pt reports he has always been a little wobbly/unsteady and that is normal for him. Pt able to stand without UE support with narrow BOS with mild increased sway. Markedly increased sway in semi tandem stance with either foot in front. Pt performed 5x STS test without UE assist and min guard assist for safety in 17.5 sec indicating increased risk of falls. pts decreased gait speed and with multiple instances of unsteadiness also indicating increased risk of falls. pts SpO2 monitored closely throughout session with pt on RA throughout. Pt reported feeling SOB after ambulation, 5x STS test and static balance testing however O2 sats 90% or greater. Pt would benefit from continued acute PT to further improve static and dynamic balance to decrease fall risk and improve activity tolerance. Pt educated on recommendation for outpatient PT for balance with pt stating he did not want to do outpatient therapy.     Follow Up Recommendations Outpatient PT    Equipment Recommendations  None recommended by PT    Recommendations for Other Services        Precautions / Restrictions Precautions Precautions: Fall Restrictions Weight Bearing Restrictions: No      Mobility  Bed Mobility Overal bed mobility: Independent             General bed mobility comments: pt sitting EOB upon arrival and ended session sitting in chair  Transfers Overall transfer level: Needs assistance Equipment used: None Transfers: Sit to/from Stand Sit to Stand: Min guard         General transfer comment: min guard for safety, overall steady with STS  Ambulation/Gait Ambulation/Gait assistance: Min guard Gait Distance (Feet): 40 Feet Assistive device: None Gait Pattern/deviations: Wide base of support;Decreased stride length;Step-through pattern Gait velocity: dec   General Gait Details: pt ambulated within room with at least 4 instances of unsteadiness and LOB however able to self correct with step response (multiple stumble steps) and reaching for furniture, after first instance pt reporting that his tennis shoes are new and he isnt used to them yet, later in session pt reporting his normal is unsteady and Holiday representative    Modified Rankin (Stroke Patients Only)       Balance Overall balance assessment: Needs assistance Sitting-balance support: Feet supported Sitting balance-Leahy Scale: Good Sitting balance - Comments: steady sitting EOB, able to bring one LE across other to don and tie tennis shoes   Standing balance support: No upper extremity supported Standing balance-Leahy Scale: Fair Standing balance comment: pt able to maintain standing  balance without support and ambulate without AD however moderate unsteadiness noted               High Level Balance Comments: pt able to maintain static standing balance with narrow BOS for 30 sec, semi tandem with either foot in front with marked increased sway Standardized Balance Assessment Standardized Balance Assessment : (5x STS in 17.5  sec)           Pertinent Vitals/Pain Pain Assessment: No/denies pain    Home Living Family/patient expects to be discharged to:: Private residence Living Arrangements: Alone Available Help at Discharge: Family;Available PRN/intermittently(son) Type of Home: Mobile home Home Access: Stairs to enter Entrance Stairs-Rails: Can reach both Entrance Stairs-Number of Steps: 2-3 Home Layout: One level Home Equipment: Walker - 2 wheels;Cane - single point      Prior Function Level of Independence: Independent         Comments: ind with all ADLs, no falls last year, son assists with bills and is local, ambulates without AD, still driving, not working, still cooking meals, household chores     Hand Dominance        Extremity/Trunk Assessment   Upper Extremity Assessment Upper Extremity Assessment: Overall WFL for tasks assessed    Lower Extremity Assessment Lower Extremity Assessment: Overall WFL for tasks assessed       Communication   Communication: No difficulties  Cognition Arousal/Alertness: Awake/alert Behavior During Therapy: WFL for tasks assessed/performed Overall Cognitive Status: Within Functional Limits for tasks assessed                                        General Comments General comments (skin integrity, edema, etc.): VSS throughout on RA    Exercises     Assessment/Plan    PT Assessment Patient needs continued PT services  PT Problem List Decreased strength;Decreased mobility;Decreased safety awareness;Decreased activity tolerance;Decreased balance;Decreased knowledge of use of DME;Cardiopulmonary status limiting activity       PT Treatment Interventions DME instruction;Therapeutic exercise;Gait training;Balance training;Stair training;Neuromuscular re-education;Functional mobility training;Therapeutic activities;Patient/family education    PT Goals (Current goals can be found in the Care Plan section)  Acute Rehab PT  Goals Patient Stated Goal: go home PT Goal Formulation: With patient Time For Goal Achievement: 05/14/19 Potential to Achieve Goals: Good    Frequency Min 2X/week   Barriers to discharge Decreased caregiver support      Co-evaluation               AM-PAC PT "6 Clicks" Mobility  Outcome Measure Help needed turning from your back to your side while in a flat bed without using bedrails?: None Help needed moving from lying on your back to sitting on the side of a flat bed without using bedrails?: None Help needed moving to and from a bed to a chair (including a wheelchair)?: A Little Help needed standing up from a chair using your arms (e.g., wheelchair or bedside chair)?: A Little Help needed to walk in hospital room?: A Little Help needed climbing 3-5 steps with a railing? : A Little 6 Click Score: 20    End of Session Equipment Utilized During Treatment: Gait belt Activity Tolerance: Patient tolerated treatment well Patient left: in chair;with call bell/phone within reach Nurse Communication: Mobility status PT Visit Diagnosis: Unsteadiness on feet (R26.81);Difficulty in walking, not elsewhere classified (R26.2)    Time: 6195-0932 PT Time Calculation (min) (ACUTE  ONLY): 25 min   Charges:   PT Evaluation $PT Eval Moderate Complexity: 1 Mod PT Treatments $Therapeutic Activity: 8-22 mins       Karoline Caldwell PT, DPT 12:43 PM,04/30/19 [phone]   Shyasia Funches Shyrl Numbers 04/30/2019, 12:34 PM

## 2019-04-30 NOTE — ED Notes (Signed)
Discharge instructions given to patient. IV removed from patient. No acute distress at this time. Son notified of discharge and will be transporting patient home.

## 2019-04-30 NOTE — ED Notes (Signed)
Pt informed that when need a urine specimen when ever he is able to . Urinal at the bedside.

## 2019-04-30 NOTE — Discharge Summary (Signed)
[facility] [place] [facility]   PATIENT NAME: Brian Montes    MR#:  [id number]  DATE OF BIRTH:  05/10/1945  DATE OF ADMISSION:  04/29/2019 ADMITTING PHYSICIAN: Alford Highland, MD  DATE OF DISCHARGE: 04/30/2019  1:45 PM  PRIMARY CARE PHYSICIAN: [facility]    ADMISSION DIAGNOSIS:  Acute hypoxemic respiratory failure due to COVID-19 (HCC) [U07.1, J96.01]  DISCHARGE DIAGNOSIS:  Active Problems:   Hyperlipidemia   Acute hypoxemic respiratory failure due to COVID-19 [facility])   SECONDARY DIAGNOSIS:   Past Medical History:  Diagnosis Date  . Acid reflux   . Calculus of gallbladder with acute and chronic cholecystitis without obstruction   . Essential hypertension   . Hyperlipidemia due to dietary fat intake     HOSPITAL COURSE:   1.  Acute hypoxic respiratory failure with ambulation secondary to COVID-19 infection.  In the ER they stated his pulse ox dropped down to 86% with ambulation.  On the day of discharge I walked the patient back and forth in the room with good wave on his pulse oximetry and he stayed above 91%.  The patient was given Decadron 2 doses and remdesivir 2 doses.  Patient was also given vitamin C and zinc and an albuterol inhaler.  The patient felt well enough and wanted to go home.  Patient was still in the emergency room from the day before.  He states his breathing is better.  The patient and son did not want to go to [facility] for completion of the remdesivir course.  Since the patient is doing better, I discharged the patient home on steroids albuterol inhaler vitamin C and zinc. 2.  Essential hypertension on [place] but make once a day lisinopril and Cardura 3.  Hyperlipidemia unspecified on Lipitor 4.  GERD on Prilosec 5.  Thrombocytopenia with viral infection 6.  Elevated glucose secondary to steroids  DISCHARGE CONDITIONS:  Satisfactory  CONSULTS OBTAINED:  None  DRUG ALLERGIES:   Allergies  Allergen  Reactions  . Codeine Other (See Comments)    DISCHARGE MEDICATIONS:   Allergies as of 04/30/2019      Reactions   Codeine Other (See Comments)      Medication List    TAKE these medications   amitriptyline 50 MG tablet Commonly known as: ELAVIL Take 50 mg by mouth at bedtime.   ascorbic acid 500 MG tablet Commonly known as: VITAMIN C Take 1 tablet (500 mg total) by mouth daily. Start taking on: May 01, 2019   aspirin EC 81 MG tablet Take 81 mg by mouth daily.   atorvastatin 40 MG tablet Commonly known as: LIPITOR Take 40 mg by mouth at bedtime.   chlorthalidone 25 MG tablet Commonly known as: HYGROTON Take 1 tablet (25 mg total) by mouth daily. What changed: when to take this   dexamethasone 2 MG tablet Commonly known as: DECADRON 3 tabs po day 1,2,3; 2 tabs po day 4,5,6; 1 tab po day 7,8;  1/2 tab po day 9,10   doxazosin 1 MG tablet Commonly known as: CARDURA Take 1 mg by mouth daily.   FLUoxetine 20 MG capsule Commonly known as: PROZAC Take 20 mg by mouth daily.   fluticasone 110 MCG/ACT inhaler Commonly known as: FLOVENT HFA One inhalation twice a day (can substitute any steroid inhaler)   Ipratropium-Albuterol 20-100 MCG/ACT Aers respimat Commonly known as: COMBIVENT Inhale 1 puff into the lungs every 6 (six) hours.   lisinopril 20 MG tablet Commonly known as:  ZESTRIL Take 20 mg by mouth daily.   omeprazole 20 MG capsule Commonly known as: PRILOSEC Take 20 mg by mouth daily.   senna 8.6 MG Tabs tablet Commonly known as: SENOKOT Take 1 tablet (8.6 mg total) by mouth daily.   zinc sulfate 220 (50 Zn) MG capsule Take 1 capsule (220 mg total) by mouth daily. Start taking on: May 01, 2019        DISCHARGE INSTRUCTIONS:   Follow-up PMD 2-week  If you experience worsening of your admission symptoms, develop shortness of breath, life threatening emergency, suicidal or homicidal thoughts you must seek medical attention immediately by  calling 911 or calling your MD immediately  if symptoms less severe.  You Must read complete instructions/literature along with all the possible adverse reactions/side effects for all the Medicines you take and that have been prescribed to you. Take any new Medicines after you have completely understood and accept all the possible adverse reactions/side effects.   Please note  You were cared for by a hospitalist during your hospital stay. If you have any questions about your discharge medications or the care you received while you were in the hospital after you are discharged, you can call the unit and asked to speak with the hospitalist on call if the hospitalist that took care of you is not available. Once you are discharged, your primary care physician will handle any further medical issues. Please note that NO REFILLS for any discharge medications will be authorized once you are discharged, as it is imperative that you return to your primary care physician (or establish a relationship with a primary care physician if you do not have one) for your aftercare needs so that they can reassess your need for medications and monitor your lab values.    Today   CHIEF COMPLAINT:   Chief Complaint  Patient presents with  . Shortness of Breath  . Weakness    HISTORY OF PRESENT ILLNESS:  Brian Montes  is a 74 y.o. male came in with shortness of breath and weakness   VITAL SIGNS:  Blood pressure 132/71, pulse (!) 50, temperature (!) 97.5 F (36.4 C), temperature source Oral, resp. rate 16, height 6' (1.829 m), weight 82.1 kg, SpO2 98 %.  I/O:  No intake or output data in the 24 hours ending 04/30/19 1614  PHYSICAL EXAMINATION:  GENERAL:  74 y.o.-year-old patient lying in the bed with no acute distress.  EYES: Pupils equal, round, reactive to light and accommodation. No scleral icterus. Extraocular muscles intact.  HEENT: Head atraumatic, normocephalic. Oropharynx and nasopharynx clear.   NECK:  Supple, no jugular venous distention. No thyroid enlargement, no tenderness.  LUNGS: Normal breath sounds bilaterally, no wheezing, rales,rhonchi or crepitation. No use of accessory muscles of respiration.  CARDIOVASCULAR: S1, S2 normal. No murmurs, rubs, or gallops.  ABDOMEN: Soft, non-tender, non-distended. Bowel sounds present. No organomegaly or mass.  EXTREMITIES: No pedal edema, cyanosis, or clubbing.  NEUROLOGIC: Cranial nerves II through XII are intact. Muscle strength 5/5 in all extremities. Sensation intact. Gait not checked.  PSYCHIATRIC: The patient is alert and oriented x 3.  SKIN: No obvious rash, lesion, or ulcer.   DATA REVIEW:   CBC Recent Labs  Lab 04/30/19 0645  WBC 2.2*  HGB 11.6*  HCT 35.1*  PLT 135*    Chemistries  Recent Labs  Lab 04/30/19 0645  NA 136  K 4.5  CL 107  CO2 23  GLUCOSE 155*  BUN 18  CREATININE 1.00  CALCIUM 8.5*  AST 27  ALT 21  ALKPHOS 96  BILITOT 1.0    Microbiology Results  Results for orders placed or performed during the hospital encounter of 04/29/19  Respiratory Panel by RT PCR (Flu A&B, Covid) - Nasopharyngeal Swab     Status: Abnormal   Collection Time: 04/29/19  3:16 PM   Specimen: Nasopharyngeal Swab  Result Value Ref Range Status   SARS Coronavirus 2 by RT PCR POSITIVE (A) NEGATIVE Final    Comment: RESULT CALLED TO, READ BACK BY AND VERIFIED WITH: CHRISSY BRAND @1615  04/29/19 MJU (NOTE) SARS-CoV-2 target nucleic acids are DETECTED. SARS-CoV-2 RNA is generally detectable in upper respiratory specimens  during the acute phase of infection. Positive results are indicative of the presence of the identified virus, but do not rule out bacterial infection or co-infection with other pathogens not detected by the test. Clinical correlation with patient history and other diagnostic information is necessary to determine patient infection status. The expected result is Negative. Fact Sheet for Patients:   PinkCheek.be Fact Sheet for Healthcare Providers: GravelBags.it This test is not yet approved or cleared by the [place] FDA and  has been authorized for detection and/or diagnosis of SARS-CoV-2 by FDA under an Emergency Use Authorization (EUA).  This EUA will remain in effect (meaning this test can be used) for t he duration of  the COVID-19 declaration under Section 564(b)(1) of the Act, 21 U.S.C. section 360bbb-3(b)(1), unless the authorization is terminated or revoked sooner.    Influenza A by PCR NEGATIVE NEGATIVE Final   Influenza B by PCR NEGATIVE NEGATIVE Final    Comment: (NOTE) The Xpert Xpress SARS-CoV-2/FLU/RSV assay is intended as an aid in  the diagnosis of influenza from Nasopharyngeal swab specimens and  should not be used as a sole basis for treatment. Nasal washings and  aspirates are unacceptable for Xpert Xpress SARS-CoV-2/FLU/RSV  testing. Fact Sheet for Patients: PinkCheek.be Fact Sheet for Healthcare Providers: GravelBags.it This test is not yet approved or cleared by the [place] FDA and  has been authorized for detection and/or diagnosis of SARS-CoV-2 by  FDA under an Emergency Use Authorization (EUA). This EUA will remain  in effect (meaning this test can be used) for the duration of the  Covid-19 declaration under Section 564(b)(1) of the Act, 21  U.S.C. section 360bbb-3(b)(1), unless the authorization is  terminated or revoked. Performed at [facility], [address] [place], [place] [address]     RADIOLOGY:  DG Chest 2 View  Result Date: 04/29/2019 CLINICAL DATA:  Weakness, fatigue, chills, shortness of breath and fever. EXAM: CHEST - 2 VIEW COMPARISON:  PA and lateral chest 10/27/2017. FINDINGS: Trace bilateral pleural effusions are seen. Lungs are clear. Heart size is upper normal. No pneumothorax. No acute or  focal bony abnormality. IMPRESSION: Tiny bilateral pleural effusions.  Otherwise negative. Electronically Signed   By: Inge Rise M.D.   On: 04/29/2019 14:15     Management plans discussed with the patient, family and they are in agreement.  CODE STATUS:     Code Status Orders  (From admission, onward)         Start     Ordered   04/29/19 1715  Full code  Continuous     04/29/19 1714        Code Status History    Date Active Date Inactive Code Status Order ID Comments User Context   04/29/2019 1714 04/29/2019 1714 Full Code [id number]  Loletha Grayer, MD  ED   10/27/2017 2017 11/16/2017 2052 Full Code [id number]  Milagros Loll, MD ED   Advance Care Planning Activity      TOTAL TIME TAKING CARE OF THIS PATIENT: 35 minutes.    Alford Highland M.D on 04/30/2019 at 4:14 PM  Between 7am to 6pm - Pager - [phone]  After 6pm go to [email or web address] - Social research officer, government  [facility]  CC: Primary care physician; [facility]

## 2019-04-30 NOTE — Discharge Instructions (Signed)
Quarantine for 13 more days  COVID-19 Frequently Asked Questions COVID-19 (coronavirus disease) is an infection that is caused by a large family of viruses. Some viruses cause illness in people and others cause illness in animals like camels, cats, and bats. In some cases, the viruses that cause illness in animals can spread to humans. Where did the coronavirus come from? In December 2019, [place] told the [facility]) of several cases of lung disease (human respiratory illness). These cases were linked to an open seafood and livestock market in the city of [place]. The link to the seafood and livestock market suggests that the virus may have spread from animals to humans. However, since that first outbreak in December, the virus has also been shown to spread from person to person. What is the name of the disease and the virus? Disease name Early on, this disease was called novel coronavirus. This is because scientists determined that the disease was caused by a new (novel) respiratory virus. The World Health Organization [facility]) has now named the disease COVID-19, or coronavirus disease. Virus name The virus that causes the disease is called severe acute respiratory syndrome coronavirus 2 (SARS-CoV-2). More information on disease and virus naming World Health Organization [facility]): www.who.int/emergencies/diseases/novel-coronavirus-2019/technical-guidance/naming-the-coronavirus-disease-(covid-2019)-and-the-virus-that-causes-it Who is at risk for complications from coronavirus disease? Some people may be at higher risk for complications from coronavirus disease. This includes older adults and people who have chronic diseases, such as heart disease, diabetes, and lung disease. If you are at higher risk for complications, take these extra precautions:  Stay home as much as possible.  Avoid social gatherings and travel.  Avoid close contact with others. Stay at least 6 ft (2 m) away from  others, if possible.  Wash your hands often with soap and water for at least 20 seconds.  Avoid touching your face, mouth, nose, or eyes.  Keep supplies on hand at home, such as food, medicine, and cleaning supplies.  If you must go out in public, wear a cloth face covering or face mask. Make sure your mask covers your nose and mouth. How does coronavirus disease spread? The virus that causes coronavirus disease spreads easily from person to person (is contagious). You may catch the virus by:  Breathing in droplets from an infected person. Droplets can be spread by a person breathing, speaking, singing, coughing, or sneezing.  Touching something, like a table or a doorknob, that was exposed to the virus (contaminated) and then touching your mouth, nose, or eyes. Can I get the virus from touching surfaces or objects? There is still a lot that we do not know about the virus that causes coronavirus disease. Scientists are basing a lot of information on what they know about similar viruses, such as:  Viruses cannot generally survive on surfaces for long. They need a human body (host) to survive.  It is more likely that the virus is spread by close contact with people who are sick (direct contact), such as through: ? Shaking hands or hugging. ? Breathing in respiratory droplets that travel through the air. Droplets can be spread by a person breathing, speaking, singing, coughing, or sneezing.  It is less likely that the virus is spread when a person touches a surface or object that has the virus on it (indirect contact). The virus may be able to enter the body if the person touches a surface or object and then touches his or her face, eyes, nose, or mouth. Can a person spread the virus  without having symptoms of the disease? It may be possible for the virus to spread before a person has symptoms of the disease, but this is most likely not the main way the virus is spreading. It is more likely  for the virus to spread by being in close contact with people who are sick and breathing in the respiratory droplets spread by a person breathing, speaking, singing, coughing, or sneezing. What are the symptoms of coronavirus disease? Symptoms vary from person to person and can range from mild to severe. Symptoms may include:  Fever or chills.  Cough.  Difficulty breathing or feeling short of breath.  Headaches, body aches, or muscle aches.  Runny or stuffy (congested) nose.  Sore throat.  New loss of taste or smell.  Nausea, vomiting, or diarrhea. These symptoms can appear anywhere from 2 to 14 days after you have been exposed to the virus. Some people may not have any symptoms. If you develop symptoms, call your health care provider. People with severe symptoms may need hospital care. Should I be tested for this virus? Your health care provider will decide whether to test you based on your symptoms, history of exposure, and your risk factors. How does a health care provider test for this virus? Health care providers will collect samples to send for testing. Samples may include:  Taking a swab of fluid from the back of your nose and throat, your nose, or your throat.  Taking fluid from the lungs by having you cough up mucus (sputum) into a sterile cup.  Taking a blood sample. Is there a treatment or vaccine for this virus? Currently, there is no vaccine to prevent coronavirus disease. Also, there are no medicines like antibiotics or antivirals to treat the virus. A person who becomes sick is given supportive care, which means rest and fluids. A person may also relieve his or her symptoms by using over-the-counter medicines that treat sneezing, coughing, and runny nose. These are the same medicines that a person takes for the common cold. If you develop symptoms, call your health care provider. People with severe symptoms may need hospital care. What can I do to protect myself and my  family from this virus?     You can protect yourself and your family by taking the same actions that you would take to prevent the spread of other viruses. Take the following actions:  Wash your hands often with soap and water for at least 20 seconds. If soap and water are not available, use alcohol-based hand sanitizer.  Avoid touching your face, mouth, nose, or eyes.  Cough or sneeze into a tissue, sleeve, or elbow. Do not cough or sneeze into your hand or the air. ? If you cough or sneeze into a tissue, throw it away immediately and wash your hands.  Disinfect objects and surfaces that you frequently touch every day.  Stay away from people who are sick.  Avoid going out in public, follow guidance from your state and local health authorities.  Avoid crowded indoor spaces. Stay at least 6 ft (2 m) away from others.  If you must go out in public, wear a cloth face covering or face mask. Make sure your mask covers your nose and mouth.  Stay home if you are sick, except to get medical care. Call your health care provider before you get medical care. Your health care provider will tell you how long to stay home.  Make sure your vaccines are up to date.  Ask your health care provider what vaccines you need. What should I do if I need to travel? Follow travel recommendations from your local health authority, the CDC, and WHO. Travel information and advice  Centers for Disease Control and Prevention (CDC): BodyEditor.hu  World Health Organization [facility]): ThirdIncome.ca Know the risks and take action to protect your health  You are at higher risk of getting coronavirus disease if you are traveling to areas with an outbreak or if you are exposed to travelers from areas with an outbreak.  Wash your hands often and practice good hygiene to lower the risk of catching or spreading the virus. What  should I do if I am sick? General instructions to stop the spread of infection  Wash your hands often with soap and water for at least 20 seconds. If soap and water are not available, use alcohol-based hand sanitizer.  Cough or sneeze into a tissue, sleeve, or elbow. Do not cough or sneeze into your hand or the air.  If you cough or sneeze into a tissue, throw it away immediately and wash your hands.  Stay home unless you must get medical care. Call your health care provider or local health authority before you get medical care.  Avoid public areas. Do not take public transportation, if possible.  If you can, wear a mask if you must go out of the house or if you are in close contact with someone who is not sick. Make sure your mask covers your nose and mouth. Keep your home clean  Disinfect objects and surfaces that are frequently touched every day. This may include: ? Counters and tables. ? Doorknobs and light switches. ? Sinks and faucets. ? Electronics such as phones, remote controls, keyboards, computers, and tablets.  Wash dishes in hot, soapy water or use a dishwasher. Air-dry your dishes.  Wash laundry in hot water. Prevent infecting other household members  Let healthy household members care for children and pets, if possible. If you have to care for children or pets, wash your hands often and wear a mask.  Sleep in a different bedroom or bed, if possible.  Do not share personal items, such as razors, toothbrushes, deodorant, combs, brushes, towels, and washcloths. Where to find more information Centers for Disease Control and Prevention (CDC)  Information and news updates: https://www.butler-gonzalez.com/ World Health Organization [facility])  Information and news updates: MissExecutive.com.ee  Coronavirus health topic: https://www.castaneda.info/  Questions and answers on COVID-19:  OpportunityDebt.at  Global tracker: who.sprinklr.com American Academy of Pediatrics (AAP)  Information for families: www.healthychildren.org/English/health-issues/conditions/chest-lungs/Pages/2019-Novel-Coronavirus.aspx The coronavirus situation is changing rapidly. Check your local health authority website or the CDC and [facility] websites for updates and news. When should I contact a health care provider?  Contact your health care provider if you have symptoms of an infection, such as fever or cough, and you: ? Have been near anyone who is known to have coronavirus disease. ? Have come into contact with a person who is suspected to have coronavirus disease. ? Have traveled to an area where there is an outbreak of COVID-19. When should I get emergency medical care?  Get help right away by calling your local emergency services (911 in the [place] if you have: ? Trouble breathing. ? Pain or pressure in your chest. ? Confusion. ? Blue-tinged lips and fingernails. ? Difficulty waking from sleep. ? Symptoms that get worse. Let the emergency medical personnel know if you think you have coronavirus disease. Summary  A new respiratory virus is spreading  from person to person and causing COVID-19 (coronavirus disease).  The virus that causes COVID-19 appears to spread easily. It spreads from one person to another through droplets from breathing, speaking, singing, coughing, or sneezing.  Older adults and those with chronic diseases are at higher risk of disease. If you are at higher risk for complications, take extra precautions.  There is currently no vaccine to prevent coronavirus disease. There are no medicines, such as antibiotics or antivirals, to treat the virus.  You can protect yourself and your family by washing your hands often, avoiding touching your face, and covering your coughs and sneezes. This information is not intended to replace advice given to you  by your health care provider. Make sure you discuss any questions you have with your health care provider. Document Revised: 02/01/2019 Document Reviewed: 07/31/2018 Elsevier Patient Education  2020 Elsevier Inc.  COVID-19: How to Protect Yourself and Others Know how it spreads  There is currently no vaccine to prevent coronavirus disease 2019 (COVID-19).  The best way to prevent illness is to avoid being exposed to this virus.  The virus is thought to spread mainly from person-to-person. ? Between people who are in close contact with one another (within about 6 feet). ? Through respiratory droplets produced when an infected person coughs, sneezes or talks. ? These droplets can land in the mouths or noses of people who are nearby or possibly be inhaled into the lungs. ? COVID-19 may be spread by people who are not showing symptoms. Everyone should Clean your hands often  Wash your hands often with soap and water for at least 20 seconds especially after you have been in a public place, or after blowing your nose, coughing, or sneezing.  If soap and water are not readily available, use a hand sanitizer that contains at least 60% alcohol. Cover all surfaces of your hands and rub them together until they feel dry.  Avoid touching your eyes, nose, and mouth with unwashed hands. Avoid close contact  Limit contact with others as much as possible.  Avoid close contact with people who are sick.  Put distance between yourself and other people. ? Remember that some people without symptoms may be able to spread virus. ? This is especially important for people who are at higher risk of getting very RetroStamps.it Cover your mouth and nose with a mask when around others  You could spread COVID-19 to others even if you do not feel sick.  Everyone should wear a mask in public settings and when around people not living in  their household, especially when social distancing is difficult to maintain. ? Masks should not be placed on young children under age 49, anyone who has trouble breathing, or is unconscious, incapacitated or otherwise unable to remove the mask without assistance.  The mask is meant to protect other people in case you are infected.  Do NOT use a facemask meant for a Research scientist (physical sciences).  Continue to keep about 6 feet between yourself and others. The mask is not a substitute for social distancing. Cover coughs and sneezes  Always cover your mouth and nose with a tissue when you cough or sneeze or use the inside of your elbow.  Throw used tissues in the trash.  Immediately wash your hands with soap and water for at least 20 seconds. If soap and water are not readily available, clean your hands with a hand sanitizer that contains at least 60% alcohol. Clean and disinfect  Clean AND  disinfect frequently touched surfaces daily. This includes tables, doorknobs, light switches, countertops, handles, desks, phones, keyboards, toilets, faucets, and sinks. ktimeonline.comwww.cdc.gov/coronavirus/2019-ncov/prevent-getting-sick/disinfecting-your-home.html  If surfaces are dirty, clean them: Use detergent or soap and water prior to disinfection.  Then, use a household disinfectant. You can see a list of EPA-registered household disinfectants here. SouthAmericaFlowers.co.ukcdc.gov/coronavirus 12/19/2018 This information is not intended to replace advice given to you by your health care provider. Make sure you discuss any questions you have with your health care provider. Document Revised: 12/27/2018 Document Reviewed: 10/25/2018 Elsevier Patient Education  2020 [employer]

## 2019-04-30 NOTE — ED Notes (Addendum)
This RN attempted to draw blood. Lab called to collect blood specimens.

## 2019-07-25 IMAGING — CR DG CHEST 2V
1 series · 2 of 2 positions shown · non-contrast
Comparison: 06/30/2007

CLINICAL DATA: Patient with complains of sharp chest pain x 2 days
w/ palpitations w/ associated shortness of breath and generalized
weakness. Pain also in abdomen. Hx of acid reflux, abdominal
surgery, and hypertension.

EXAM:
CHEST - 2 VIEW

[Series 1: dg chest 2 view · 0.14mm/px · 2 of 2 slices shown]
[im 1/2]
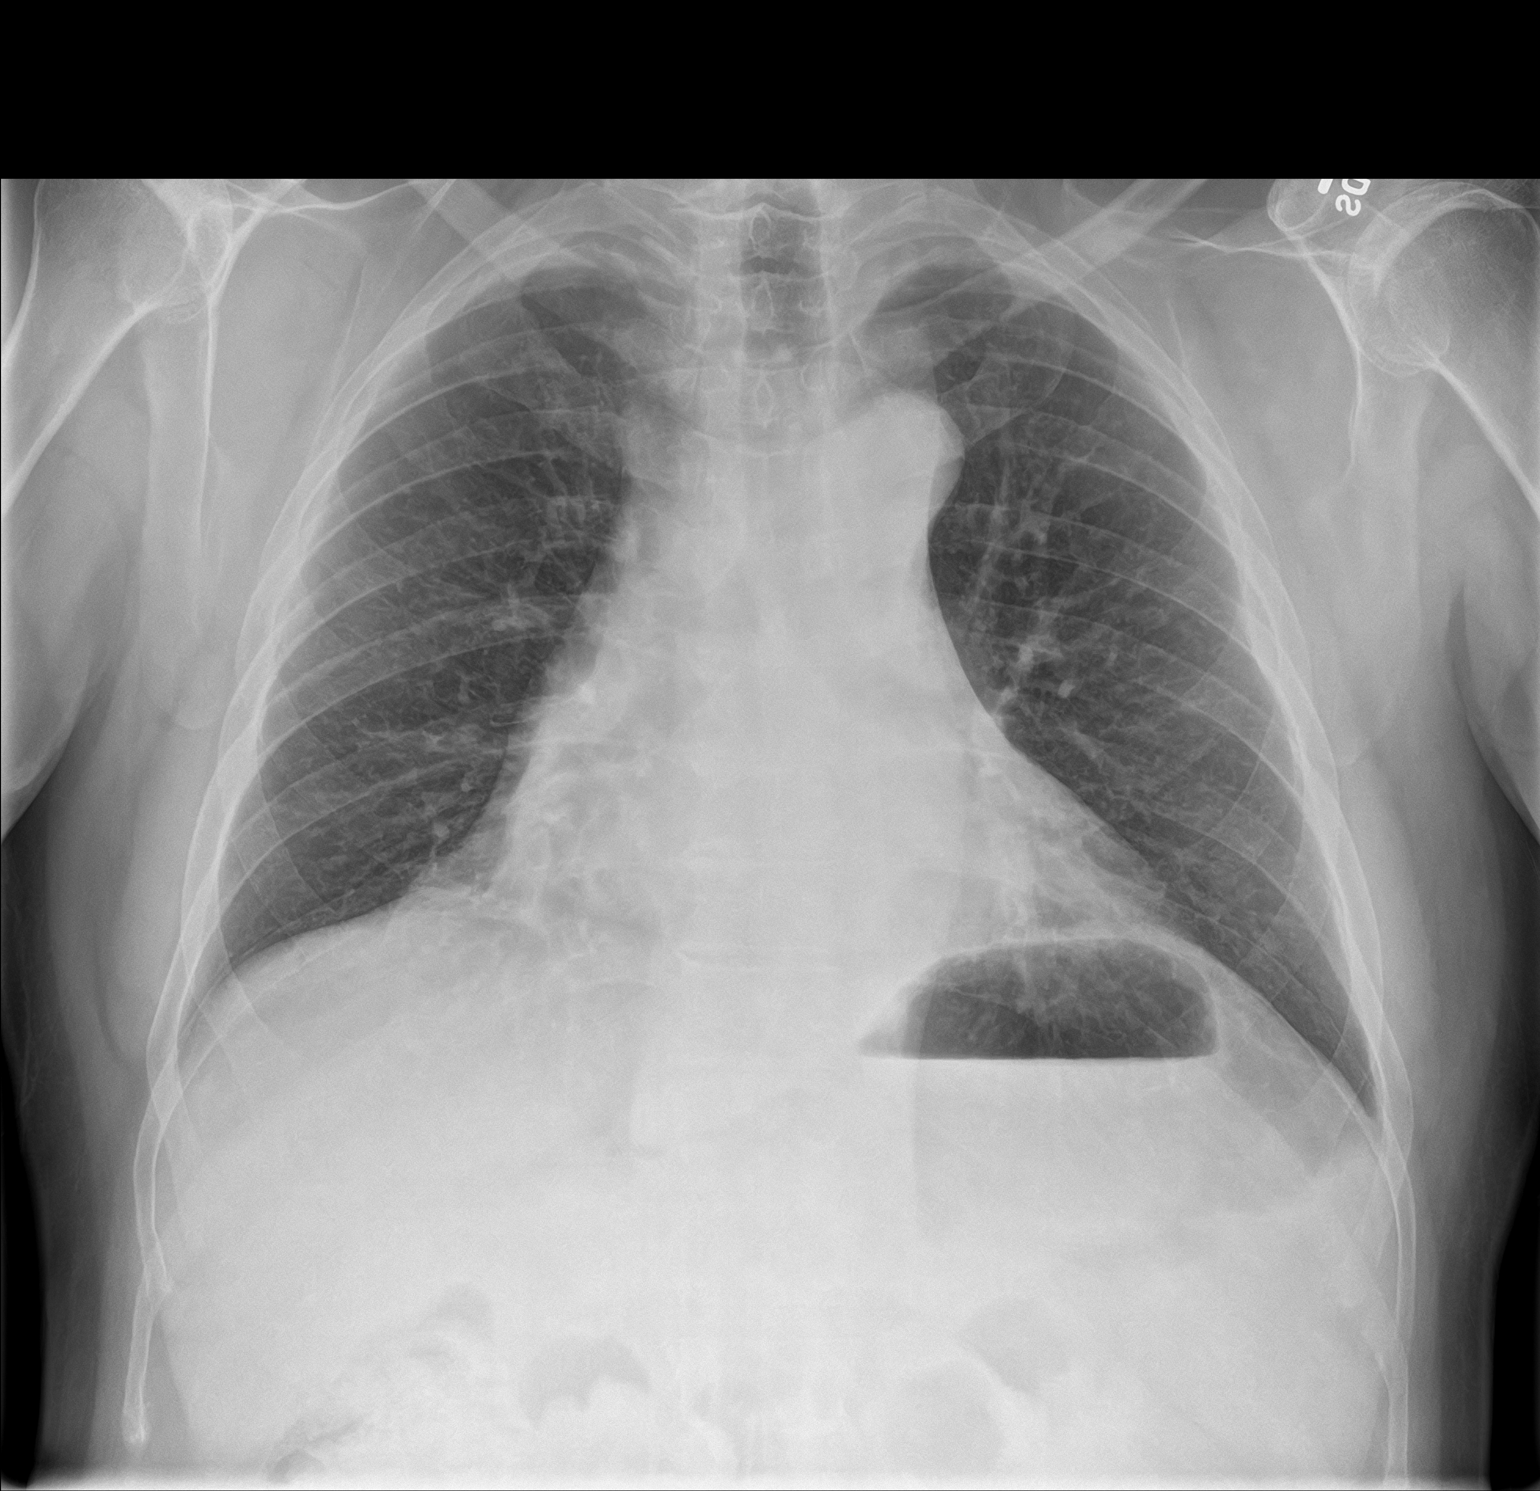
[im 2/2]
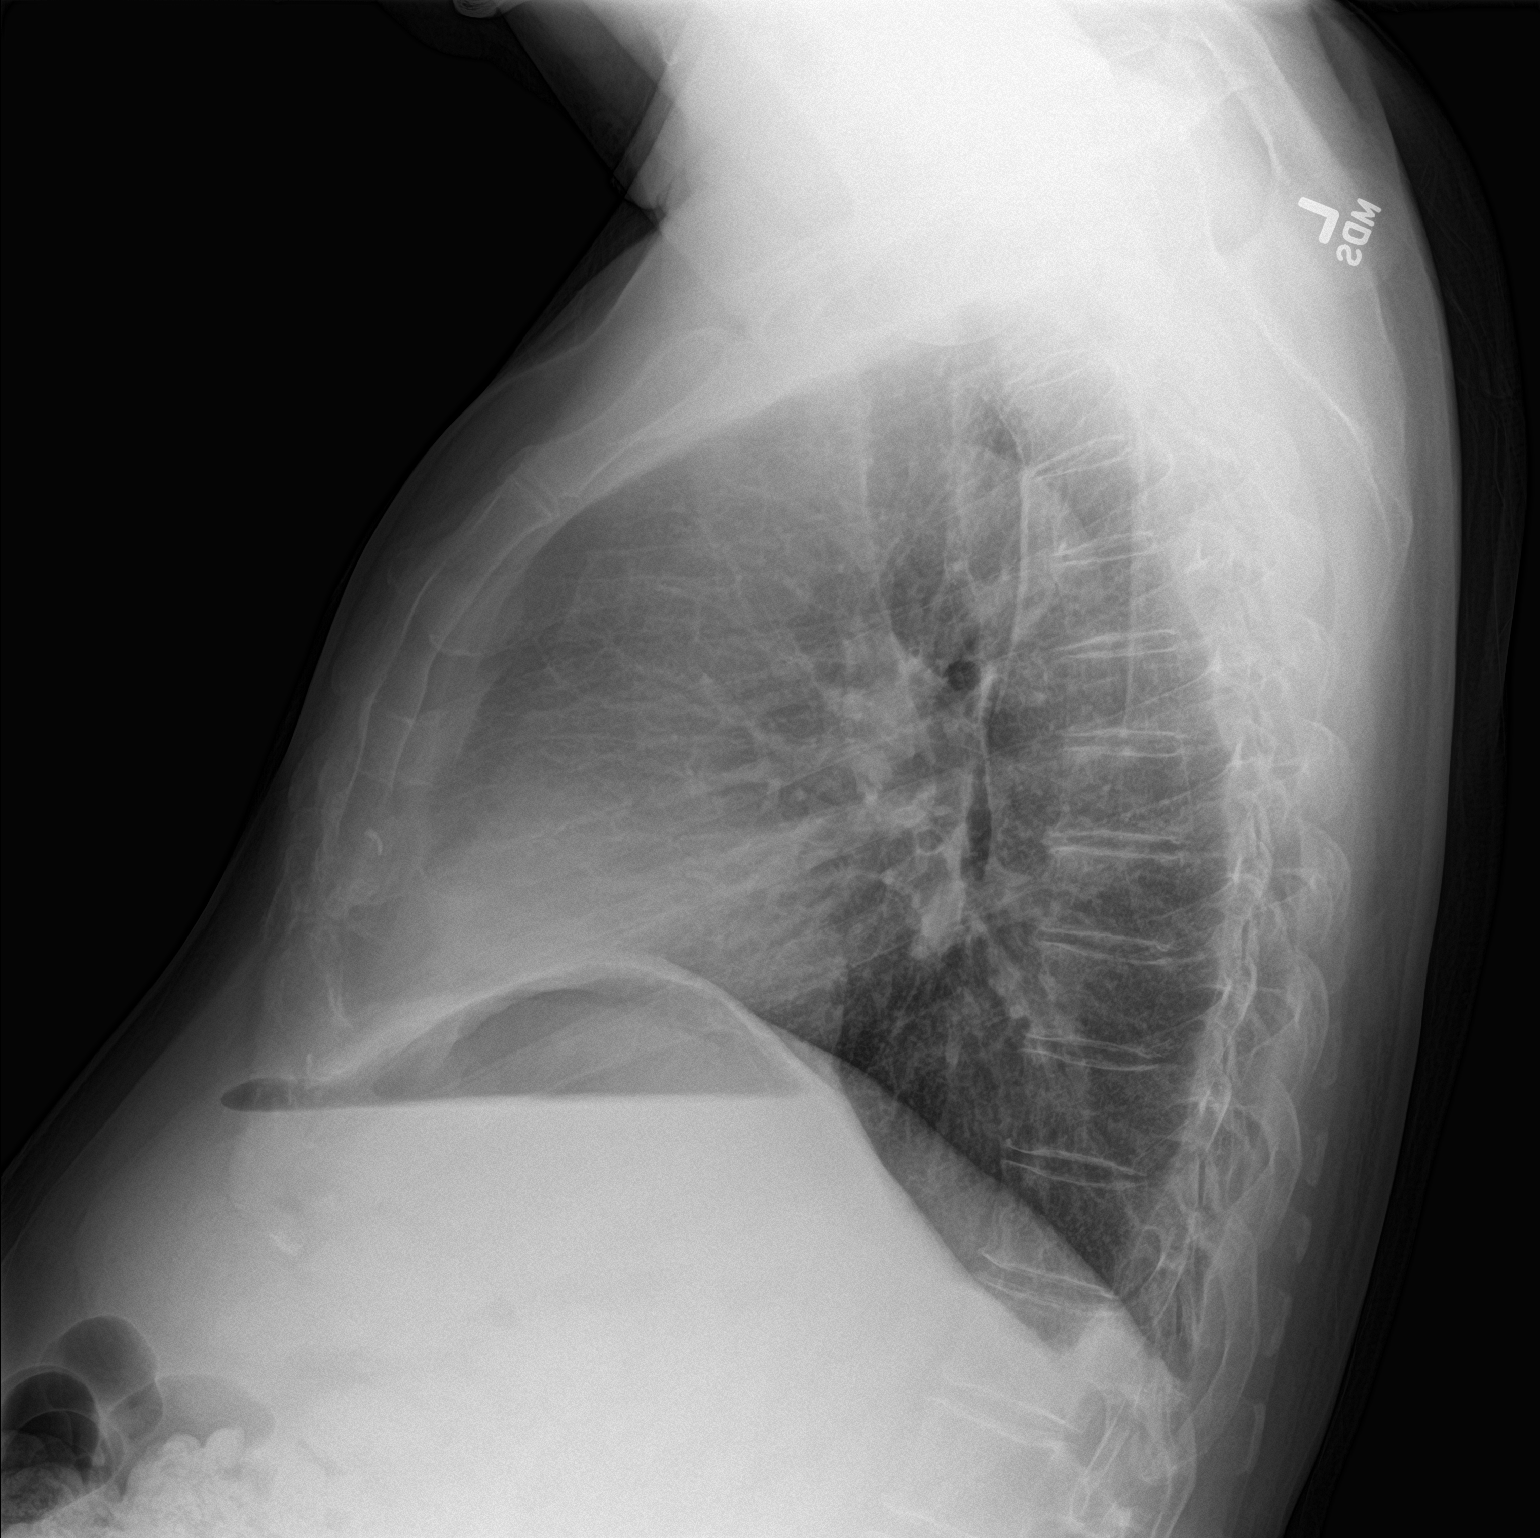

[2 of 2 positions shown; findings below may reference images not displayed]

FINDINGS: Cardiac silhouette is mildly enlarged. No mediastinal or hilar
masses. No evidence of adenopathy.

Clear lungs.  No pleural effusion or pneumothorax.

Skeletal structures are intact.
IMPRESSION: No active cardiopulmonary disease.

## 2019-08-03 IMAGING — CR DG ABDOMEN 2V
4 series · 4 of 4 positions shown · non-contrast
Comparison: 11/04/2017 and 10/29/2017

CLINICAL DATA: Diffuse abdominal pain worsening.

EXAM:
ABDOMEN - 2 VIEW

[abdomen erect]
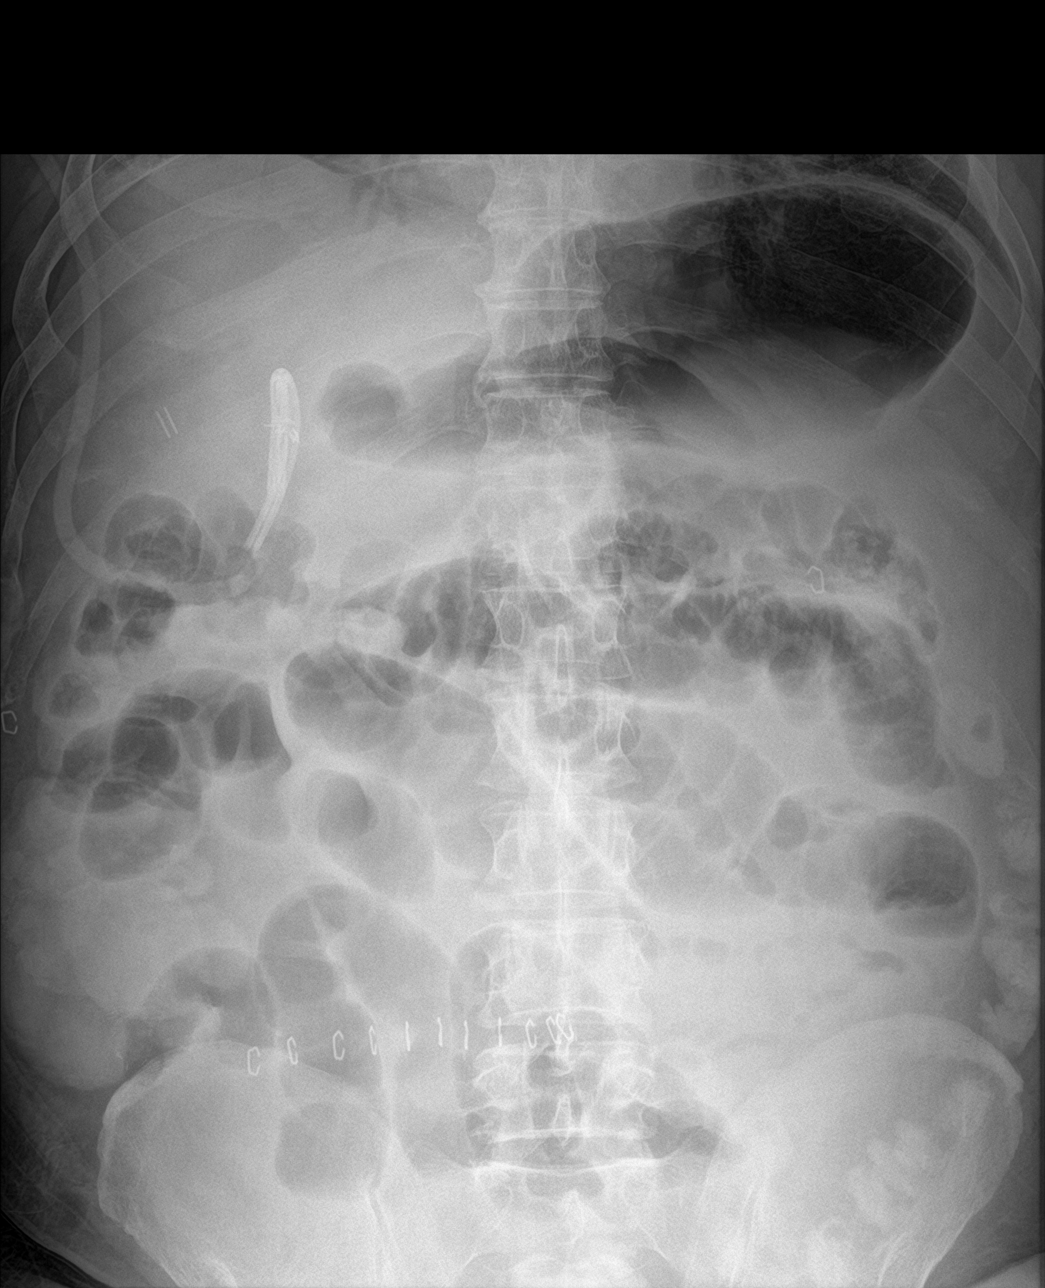

[abdomen supine (1 of 3)]
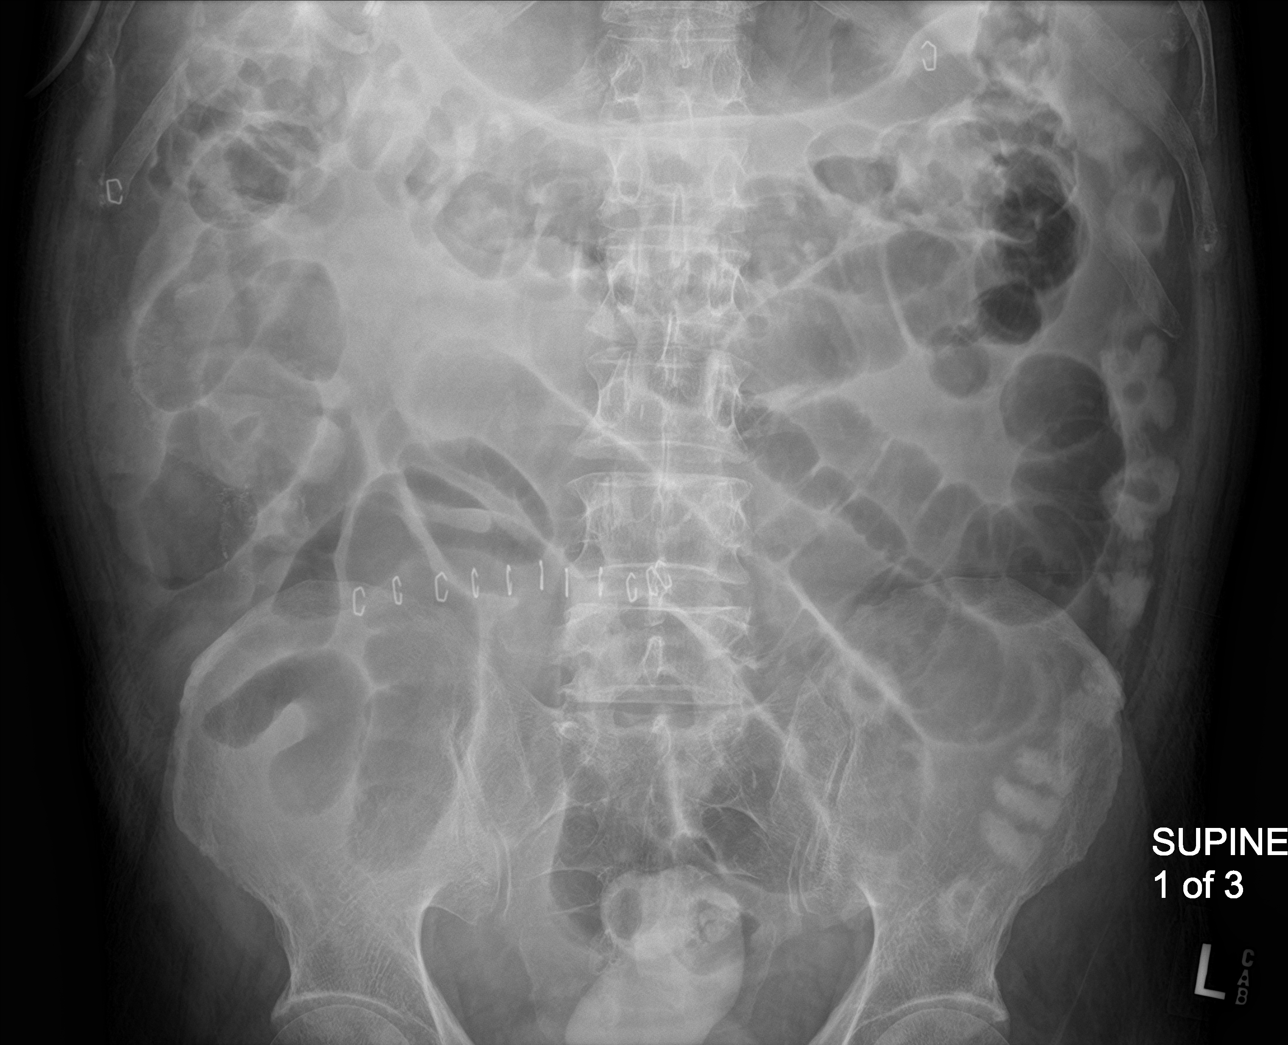

[abdomen supine (2 of 3)]
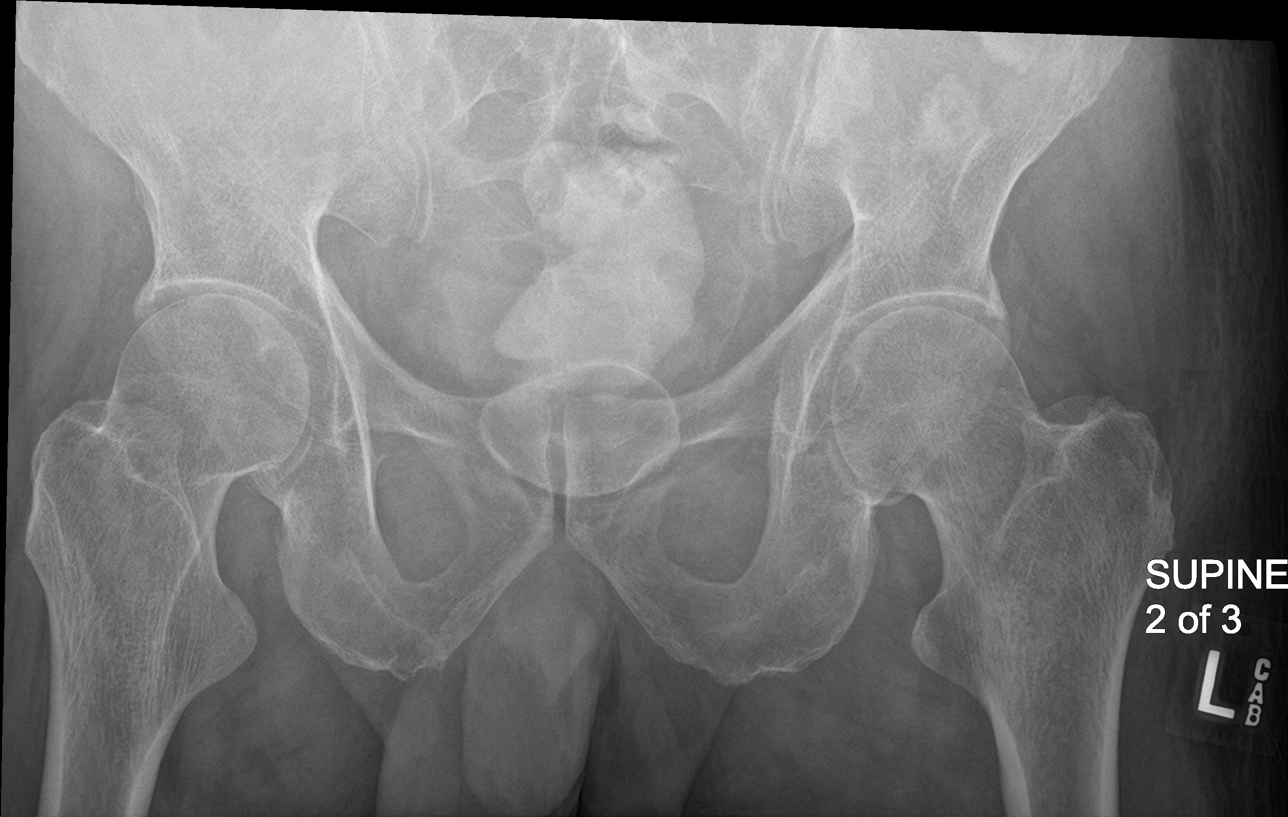

[abdomen supine (3 of 3)]
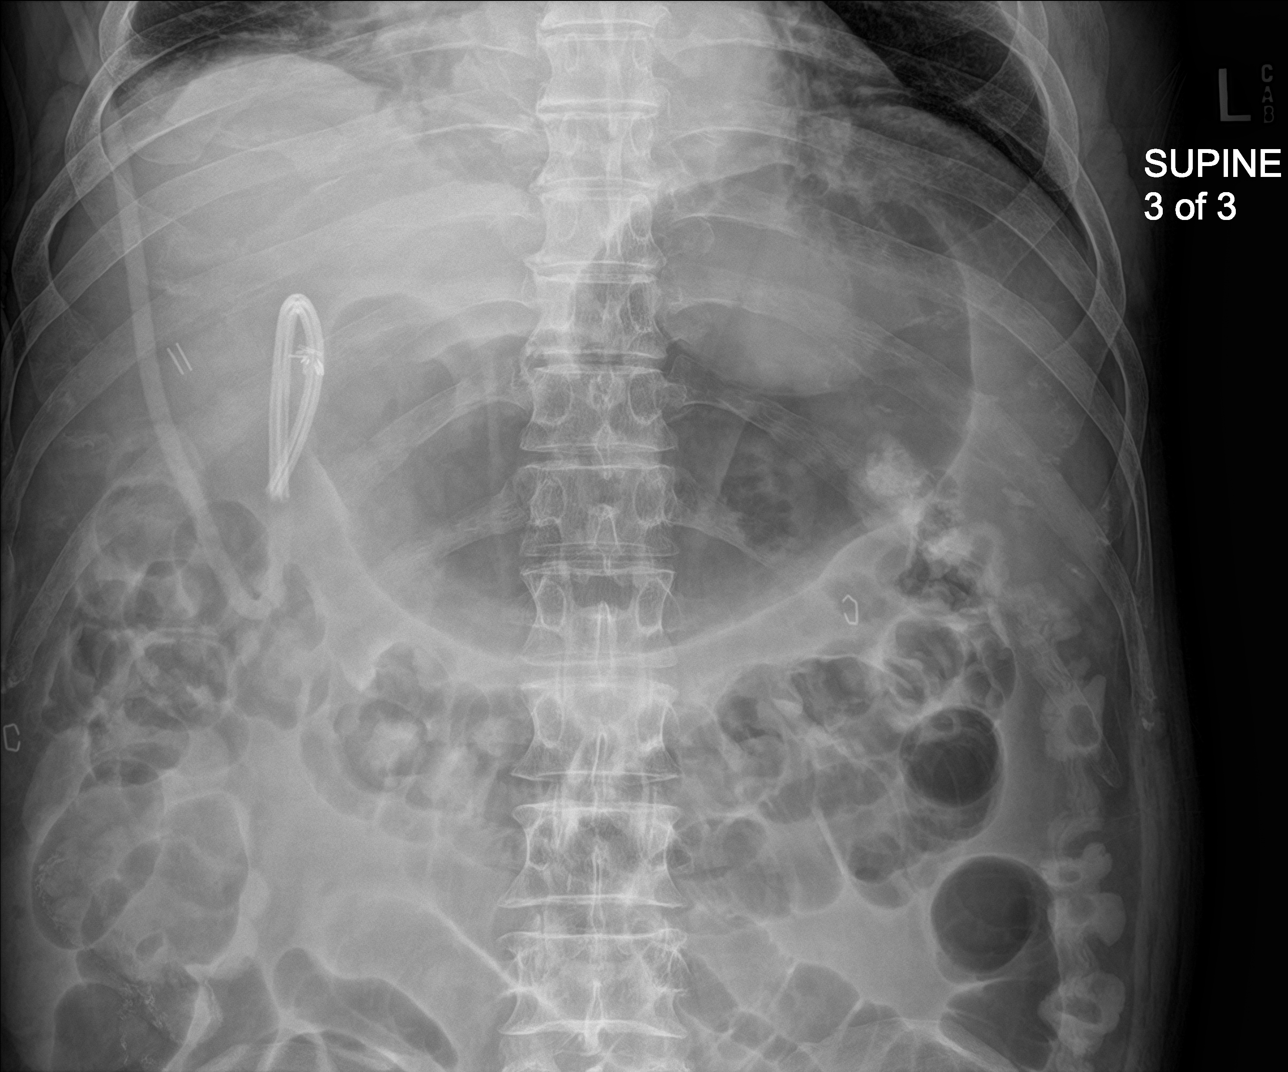

[4 of 4 positions shown; findings below may reference images not displayed]

FINDINGS: Surgical clips over the right upper quadrant with surgical drain
over the right upper quadrant compatible recent cholecystectomy.
Skin staples present. Bowel gas pattern demonstrates air and
contrast throughout the colon as there is persistence of several
air-filled minimally dilated small bowel loops without significant
change likely postoperative ileus. Remainder of the exam is
unchanged.
IMPRESSION: Stable air-filled minimally dilated small bowel loops suggesting
postoperative ileus. Air throughout the colon.

Postoperative change with drain over the right upper quadrant
compatible with recent cholecystectomy.

## 2019-11-13 ENCOUNTER — Other Ambulatory Visit: Payer: Self-pay | Admitting: Family Medicine

## 2019-11-13 DIAGNOSIS — Z136 Encounter for screening for cardiovascular disorders: Secondary | ICD-10-CM

## 2019-11-18 ENCOUNTER — Other Ambulatory Visit: Payer: Self-pay | Admitting: Family Medicine

## 2019-11-18 DIAGNOSIS — I10 Essential (primary) hypertension: Secondary | ICD-10-CM

## 2019-11-18 DIAGNOSIS — Z87891 Personal history of nicotine dependence: Secondary | ICD-10-CM

## 2019-11-18 DIAGNOSIS — Z136 Encounter for screening for cardiovascular disorders: Secondary | ICD-10-CM

## 2019-11-21 ENCOUNTER — Other Ambulatory Visit: Payer: Self-pay

## 2019-11-21 ENCOUNTER — Ambulatory Visit
Admission: RE | Admit: 2019-11-21 | Discharge: 2019-11-21 | Disposition: A | Discharge status: Home / Self Care | Payer: Medicare Other | Source: Ambulatory Visit | Attending: Family Medicine | Admitting: Family Medicine

## 2019-11-21 DIAGNOSIS — Z87891 Personal history of nicotine dependence: Secondary | ICD-10-CM | POA: Insufficient documentation

## 2019-11-21 DIAGNOSIS — I10 Essential (primary) hypertension: Secondary | ICD-10-CM | POA: Insufficient documentation

## 2019-11-21 DIAGNOSIS — Z136 Encounter for screening for cardiovascular disorders: Secondary | ICD-10-CM | POA: Insufficient documentation

## 2020-11-27 ENCOUNTER — Emergency Department
Admission: EM | Admit: 2020-11-27 | Discharge: 2020-11-27 | Disposition: A | Discharge status: Home / Self Care | Payer: Medicare Other | Attending: Emergency Medicine | Admitting: Emergency Medicine

## 2020-11-27 ENCOUNTER — Other Ambulatory Visit: Payer: Self-pay

## 2020-11-27 ENCOUNTER — Ambulatory Visit (INDEPENDENT_AMBULATORY_CARE_PROVIDER_SITE_OTHER): Payer: Medicare Other

## 2020-11-27 ENCOUNTER — Ambulatory Visit
Admission: EM | Admit: 2020-11-27 | Discharge: 2020-11-27 | Disposition: A | Discharge status: Home / Self Care | Payer: Medicare Other | Source: Home / Self Care | Attending: Family Medicine | Admitting: Family Medicine

## 2020-11-27 ENCOUNTER — Emergency Department: Payer: Medicare Other

## 2020-11-27 ENCOUNTER — Encounter: Payer: Self-pay | Admitting: Emergency Medicine

## 2020-11-27 DIAGNOSIS — I10 Essential (primary) hypertension: Secondary | ICD-10-CM | POA: Insufficient documentation

## 2020-11-27 DIAGNOSIS — Z79899 Other long term (current) drug therapy: Secondary | ICD-10-CM | POA: Diagnosis not present

## 2020-11-27 DIAGNOSIS — Z7982 Long term (current) use of aspirin: Secondary | ICD-10-CM | POA: Insufficient documentation

## 2020-11-27 DIAGNOSIS — R55 Syncope and collapse: Secondary | ICD-10-CM | POA: Insufficient documentation

## 2020-11-27 DIAGNOSIS — M25551 Pain in right hip: Secondary | ICD-10-CM | POA: Diagnosis not present

## 2020-11-27 DIAGNOSIS — Z8616 Personal history of COVID-19: Secondary | ICD-10-CM | POA: Insufficient documentation

## 2020-11-27 DIAGNOSIS — R1110 Vomiting, unspecified: Secondary | ICD-10-CM | POA: Diagnosis not present

## 2020-11-27 DIAGNOSIS — M545 Low back pain, unspecified: Secondary | ICD-10-CM

## 2020-11-27 DIAGNOSIS — E871 Hypo-osmolality and hyponatremia: Secondary | ICD-10-CM | POA: Insufficient documentation

## 2020-11-27 DIAGNOSIS — Z87891 Personal history of nicotine dependence: Secondary | ICD-10-CM | POA: Insufficient documentation

## 2020-11-27 LAB — CBC WITH DIFFERENTIAL/PLATELET
Abs Immature Granulocytes: 0.04 10*3/uL (ref 0.00–0.07)
Basophils Absolute: 0.1 10*3/uL (ref 0.0–0.1)
Basophils Relative: 1 %
Eosinophils Absolute: 0.2 10*3/uL (ref 0.0–0.5)
Eosinophils Relative: 3 %
HCT: 41 % (ref 39.0–52.0)
Hemoglobin: 14.6 g/dL (ref 13.0–17.0)
Immature Granulocytes: 1 %
Lymphocytes Relative: 31 %
Lymphs Abs: 2.3 10*3/uL (ref 0.7–4.0)
MCH: 31.9 pg (ref 26.0–34.0)
MCHC: 35.6 g/dL (ref 30.0–36.0)
MCV: 89.5 fL (ref 80.0–100.0)
Monocytes Absolute: 0.6 10*3/uL (ref 0.1–1.0)
Monocytes Relative: 8 %
Neutro Abs: 4.3 10*3/uL (ref 1.7–7.7)
Neutrophils Relative %: 56 %
Platelets: 155 10*3/uL (ref 150–400)
RBC: 4.58 MIL/uL (ref 4.22–5.81)
RDW: 12.5 % (ref 11.5–15.5)
WBC: 7.6 10*3/uL (ref 4.0–10.5)
nRBC: 0 % (ref 0.0–0.2)

## 2020-11-27 LAB — BASIC METABOLIC PANEL
Anion gap: 7 (ref 5–15)
BUN: 16 mg/dL (ref 8–23)
CO2: 29 mmol/L (ref 22–32)
Calcium: 9.6 mg/dL (ref 8.9–10.3)
Chloride: 94 mmol/L — ABNORMAL LOW (ref 98–111)
Creatinine, Ser: 1.09 mg/dL (ref 0.61–1.24)
GFR, Estimated: >60 mL/min (ref >60)
Glucose, Bld: 110 mg/dL — ABNORMAL HIGH (ref 70–99)
Potassium: 4.3 mmol/L (ref 3.5–5.1)
Sodium: 130 mmol/L — ABNORMAL LOW (ref 135–145)

## 2020-11-27 LAB — COMPREHENSIVE METABOLIC PANEL
ALT: 31 U/L (ref 0–44)
AST: 33 U/L (ref 15–41)
Albumin: 4.3 g/dL (ref 3.5–5.0)
Alkaline Phosphatase: 127 U/L — ABNORMAL HIGH (ref 38–126)
Anion gap: 10 (ref 5–15)
BUN: 15 mg/dL (ref 8–23)
CO2: 26 mmol/L (ref 22–32)
Calcium: 9.5 mg/dL (ref 8.9–10.3)
Chloride: 89 mmol/L — ABNORMAL LOW (ref 98–111)
Creatinine, Ser: 1.05 mg/dL (ref 0.61–1.24)
GFR, Estimated: >60 mL/min (ref >60)
Glucose, Bld: 92 mg/dL (ref 70–99)
Potassium: 3.6 mmol/L (ref 3.5–5.1)
Sodium: 125 mmol/L — ABNORMAL LOW (ref 135–145)
Total Bilirubin: 1.2 mg/dL (ref 0.3–1.2)
Total Protein: 8.4 g/dL — ABNORMAL HIGH (ref 6.5–8.1)

## 2020-11-27 LAB — TROPONIN I (HIGH SENSITIVITY)
Troponin I (High Sensitivity): 7 ng/L (ref <18)
Troponin I (High Sensitivity): 6 ng/L (ref <18)

## 2020-11-27 MED ORDER — ACETAMINOPHEN 500 MG PO TABS
1000.0000 mg | ORAL_TABLET | Freq: Once | ORAL | Status: AC
  Administered 2020-11-27: 1000 mg via ORAL
  Filled 2020-11-27: qty 2

## 2020-11-27 MED ORDER — LIDOCAINE 5 % EX PTCH
1.0000 | MEDICATED_PATCH | Freq: Once | CUTANEOUS | Status: DC
  Administered 2020-11-27: 1 via TRANSDERMAL
  Filled 2020-11-27: qty 1

## 2020-11-27 MED ORDER — SODIUM CHLORIDE 0.9 % IV BOLUS
1000.0000 mL | Freq: Once | INTRAVENOUS | Status: AC
  Administered 2020-11-27: 1000 mL via INTRAVENOUS

## 2020-11-27 MED ORDER — LIDOCAINE 5 % EX PTCH: 1.0000 | MEDICATED_PATCH | Freq: Two times a day (BID) | CUTANEOUS | 0 refills | Status: AC

## 2020-11-27 MED ORDER — METHOCARBAMOL 500 MG PO TABS
500.0000 mg | ORAL_TABLET | Freq: Once | ORAL | Status: AC
  Administered 2020-11-27: 500 mg via ORAL
  Filled 2020-11-27: qty 1

## 2020-11-27 NOTE — ED Triage Notes (Signed)
Pt is present today with right hip pain from a fall that happened Monday. Pt state that he was sitting on a bench and he leaned forward and LOC

## 2020-11-27 NOTE — Discharge Instructions (Addendum)
Patient had a recent syncopal episode.  Patient is markedly hyponatremic.  He had negative x-rays of his low back (this is his primary complaint).  Patient needs IV fluids and close monitoring for the hyponatremia.  Everlene Other DO [facility]

## 2020-11-27 NOTE — ED Provider Notes (Signed)
[facility] Emergency Department Provider Note ____________________________________________   Event Date/Time   First MD Initiated Contact with Patient 11/27/20 1435     (approximate)  I have reviewed the triage vital signs and the nursing notes.  HISTORY  Chief Complaint Hip Pain   HPI Brian Montes is a 75 y.o. malewho presents to the ED for evaluation of hip pain after a fall.  Chart review indicates patient was seen at local urgent care earlier today due to a fall that occurred 5 days ago and has had associated right-sided hip pain since that time.  Screening blood work was sent at urgent care, returning with hyponatremia and sodium of 125.  Due to this, he was sent to the ED for evaluation. .  Also performed a plain film of the lumbar spine this morning without evidence of acute fracture.  Patient presents to the ED, companied by his son, for evaluation of continued intermittent right-sided hip pain since that fall.  He reports a sharp pain to his right lateral hip that is nonradiating and intermittent.  Denies pain while at rest and denies sciatica symptoms.  Regarding his fall that occurred 5 days ago.  Patient actually reports a syncopal episode.  He reports he was seated, developed a sudden falling sensation, and reports falling forward and syncopizing.  Son reports witnessing this and denies any seizure-like activity.  Patient denies any associated chest pain, headache, just reports that sharp pains to his right hip after awakening from this episode.  Does report 2 episodes of nonbloody nonbilious emesis associated with syncope.  No additional episodes of syncope, falls or injuries since that time.  He reports he has been able to ambulate on this right hip since then.  Past Medical History:  Diagnosis Date   Acid reflux    Calculus of gallbladder with acute and chronic cholecystitis without obstruction    Essential hypertension    Hyperlipidemia  due to dietary fat intake     Patient Active Problem List   Diagnosis Date Noted   Elevated glucose    Acute hypoxemic respiratory failure due to COVID-19 (HCC) 04/29/2019   Gastroesophageal reflux disease without esophagitis    Essential hypertension    Hyperlipidemia     Past Surgical History:  Procedure Laterality Date   CARDIAC CATHETERIZATION  2010   Dr. Lady Gary - NORMAL CORONARIES   CHOLECYSTECTOMY N/A 11/02/2017   Procedure: LAPAROSCOPIC CHOLECYSTECTOMY;  Surgeon: Ancil Linsey, MD;  Location: [facility] ORS;  Service: General;  Laterality: N/A;   RIGHT COLECTOMY  2009   at [facility]    Prior to Admission medications   Medication Sig Start Date End Date Taking? Authorizing Provider  lidocaine (LIDODERM) 5 % Place 1 patch onto the skin every 12 (twelve) hours. Remove & Discard patch within 12 hours or as directed by MD 11/27/20 11/27/21 Yes Delton Prairie, MD  amitriptyline (ELAVIL) 50 MG tablet Take 50 mg by mouth at bedtime.  10/09/17   [provider]  ascorbic acid (VITAMIN C) 500 MG tablet Take 1 tablet (500 mg total) by mouth daily. 05/01/19   Alford Highland, MD  aspirin EC 81 MG tablet Take 81 mg by mouth daily.    [provider]  atorvastatin (LIPITOR) 40 MG tablet Take 40 mg by mouth at bedtime. 08/15/17   [provider]  chlorthalidone (HYGROTON) 25 MG tablet Take 1 tablet (25 mg total) by mouth daily. 04/30/19   Alford Highland, MD  doxazosin (CARDURA) 1  MG tablet Take 1 mg by mouth daily. 10/25/17   [provider]  FLUoxetine (PROZAC) 20 MG capsule Take 20 mg by mouth daily.    [provider]  fluticasone (FLOVENT HFA) 110 MCG/ACT inhaler One inhalation twice a day (can substitute any steroid inhaler) 04/30/19   Alford Highland, MD  Ipratropium-Albuterol (COMBIVENT) 20-100 MCG/ACT AERS respimat Inhale 1 puff into the lungs every 6 (six) hours. 04/30/19   Alford Highland, MD  lisinopril (PRINIVIL,ZESTRIL) 20 MG tablet Take 20 mg by  mouth daily. 09/12/17   [provider]  omeprazole (PRILOSEC) 20 MG capsule Take 20 mg by mouth daily. 10/09/17   [provider]  senna (SENOKOT) 8.6 MG TABS tablet Take 1 tablet (8.6 mg total) by mouth daily. 11/04/17   Katha Hamming, MD  zinc sulfate 220 (50 Zn) MG capsule Take 1 capsule (220 mg total) by mouth daily. 05/01/19   Alford Highland, MD    Allergies Codeine  Family History  Problem Relation Age of Onset   Diabetes Brother    CAD Father     Social History Social History   Tobacco Use   Smoking status: Former   Smokeless tobacco: Current    Types: Associate Professor Use: Never used  Substance Use Topics   Alcohol use: Not Currently   Drug use: Never    Review of Systems  Constitutional: No fever/chills Eyes: No visual changes. ENT: No sore throat. Cardiovascular: Denies chest pain. Respiratory: Denies shortness of breath. Gastrointestinal: No abdominal pain.   No diarrhea.  No constipation. Positive for emesis Genitourinary: Negative for dysuria. Musculoskeletal: Negative for back pain. Positive for right hip pain after fall Skin: Negative for rash. Neurological: Negative for headaches, focal weakness or numbness.  ____________________________________________   PHYSICAL EXAM:  VITAL SIGNS: Vitals:   11/27/20 1353  BP: 140/74  Pulse: (!) 59  Resp: 18  Temp: 97.9 F (36.6 C)  SpO2: 100%     Constitutional: Alert and oriented. Well appearing and in no acute distress. Eyes: Conjunctivae are normal. PERRL. EOMI. Head: Atraumatic. Nose: No congestion/rhinnorhea. Mouth/Throat: Mucous membranes are moist.  Oropharynx non-erythematous. Neck: No stridor. No cervical spine tenderness to palpation. Cardiovascular: Normal rate, regular rhythm. Grossly normal heart sounds.  Good peripheral circulation. Respiratory: Normal respiratory effort.  No retractions. Lungs CTAB. Gastrointestinal: Soft , nondistended, nontender to  palpation. No CVA tenderness. Musculoskeletal: No lower extremity tenderness nor edema.  No joint effusions. No signs of acute trauma. Tenderness to palpation to right lateral inferior flank, just to superior to his iliac crest.  No overlying signs of trauma or skin changes.  This reproduces symptoms. No frontal abdominal tenderness to palpation. Neurologic:  Normal speech and language. No gross focal neurologic deficits are appreciated. No gait instability noted. Skin:  Skin is warm, dry and intact. No rash noted. Psychiatric: Mood and affect are normal. Speech and behavior are normal.  ____________________________________________   LABS (all labs ordered are listed, but only abnormal results are displayed)  Labs Reviewed  BASIC METABOLIC PANEL - Abnormal; Notable for the following components:      Result Value   Sodium 130 (*)    Chloride 94 (*)    Glucose, Bld 110 (*)    All other components within normal limits   ____________________________________________  12 Lead EKG  Sinus rhythm, rate of 55 bpm.  Normal axis.  First-degree AV block and right bundle branch block.  No evidence of acute ischemia. ____________________________________________  RADIOLOGY  ED MD interpretation: Plain film of the pelvis, right hip and lumbar spine reviewed by me without evidence of fracture or dislocation  Official radiology report(s): DG Lumbar Spine Complete  Result Date: 11/27/2020 CLINICAL DATA:  Low back pain.  Syncope with recent fall. EXAM: LUMBAR SPINE - COMPLETE 4+ VIEW COMPARISON:  CT AP 11/07/2017 FINDINGS: The alignment of the lumbar spine appears normal. The vertebral body heights are well preserved. No fracture identified. Mild ventral endplate spurring identified throughout the lumbar spine. Disc spaces are relatively well preserved. Facet degenerative change noted at the L5-S1 level. IMPRESSION: 1. No acute findings. 2. Mild degenerative changes. Electronically Signed   By: Signa Kellaylor   Stroud M.D.   On: 11/27/2020 12:15   DG Hip Unilat  With Pelvis 2-3 Views Right  Result Date: 11/27/2020 CLINICAL DATA:  Right hip pain after fall. EXAM: DG HIP (WITH OR WITHOUT PELVIS) 2-3V RIGHT COMPARISON:  None. FINDINGS: There is no evidence of hip fracture or dislocation. Mild osteophyte formation is seen involving the right hip. No significant joint space narrowing is noted. IMPRESSION: Mild degenerative joint disease of right hip. No acute abnormality seen. Electronically Signed   By: Lupita RaiderJames  Green Jr M.D.   On: 11/27/2020 14:56    ____________________________________________   PROCEDURES and INTERVENTIONS  Procedure(s) performed (including Critical Care):  Procedures  Medications  lidocaine (LIDODERM) 5 % 1 patch (has no administration in time range)  sodium chloride 0.9 % bolus 1,000 mL (1,000 mLs Intravenous New Bag/Given 11/27/20 1602)  acetaminophen (TYLENOL) tablet 1,000 mg (1,000 mg Oral Given 11/27/20 1537)  methocarbamol (ROBAXIN) tablet 500 mg (500 mg Oral Given 11/27/20 1537)    ____________________________________________   MDM / ED COURSE   75 year old male presents to the ED with right-sided hip pain, likely mild MSK pain, found to have hyponatremia and ultimately amenable to outpatient management.  Normal vitals.  Exam reassuring without evidence of significant trauma, neurologic or vascular deficits.  He looks well.  Does have tenderness to the lateral aspect of the right-sided lumbar back/right flank without overlying skin changes or signs of herpes zoster.  No frontal abdominal tenderness or midline spinal tenderness.  Plain films of the pelvis, right hip and lumbar spine without evidence of fracture or traumatic pathology.  EKG without concerning interval changes to suggest cardiac syncope from his episode of falling and possibly syncopizing a few days ago.  I reviewed blood work from urgent care today with hyponatremia to 125, we repeat this prior to intervention  and note sodium of 130.  He was provided a liter of normal saline after this for repletion.  Improvement of his pain after Tylenol, Robaxin and Lidoderm patch.  Considering sodium of 130 prior to normal saline administration, is overall well-appearing status, I think he would be suitable for outpatient management.  Return precautions were discussed.  Clinical Course as of 11/27/20 1754  Fri Nov 27, 2020  1527 We discussed plan of care regarding his hyponatremia noted on urgent care blood work.  We discussed possible need for medical observation admission and he is agreeable. [DS]  1636 Reassessed.  Patient reports improving symptoms of hip/thigh pain after his analgesic medications.  We discussed hyponatremia not as severe on her blood work.  We discussed the possibility of outpatient management after finishing his normal saline fluid bolus and a period of observation.  We discussed the need for close PCP follow-up and metabolic panel rechecked as an outpatient if we go throughwith this route.  He is in  agreement. [DS]    Clinical Course User Index [DS] Delton Prairie, MD    ____________________________________________   FINAL CLINICAL IMPRESSION(S) / ED DIAGNOSES  Final diagnoses:  Hyponatremia  Right hip pain     ED Discharge Orders          Ordered    lidocaine (LIDODERM) 5 %  Every 12 hours        11/27/20 1753             Brittanya Winburn Katrinka Blazing   Note:  This document was prepared using Dragon voice recognition software and may include unintentional dictation errors.    Delton Prairie, MD 11/27/20 [phone]

## 2020-11-27 NOTE — ED Provider Notes (Signed)
[facility]    CSN: [id number] Arrival date & time: 11/27/20  1038      History   Chief Complaint Chief Complaint  Patient presents with   Fall   Hip Pain    HPI 75 year old male presents with the above complaints.  [place]  He [place].  He states that he subsequently had nausea and vomited and believes he fell forward onto the ground.  He states that he apparently passed out because he awoke in a puddle of emesis.  He does not recall how long he was out.  Patient did not seek medical care.  Patient states that since that time he has been experiencing right-sided low back pain.  He rates his pain is 8/10 in severity.  Worse with movement and range of motion.  Denies chest pain or shortness of breath.  No current abdominal pain.  No other associated symptoms.  No other complaints.  Past Medical History:  Diagnosis Date   Acid reflux    Calculus of gallbladder with acute and chronic cholecystitis without obstruction    Essential hypertension    Hyperlipidemia due to dietary fat intake     Patient Active Problem List   Diagnosis Date Noted   Elevated glucose    Acute hypoxemic respiratory failure due to COVID-19 (HCC) 04/29/2019   Gastroesophageal reflux disease without esophagitis    Essential hypertension    Hyperlipidemia     Past Surgical History:  Procedure Laterality Date   CARDIAC CATHETERIZATION  2010   Dr. Lady Gary - NORMAL CORONARIES   CHOLECYSTECTOMY N/A 11/02/2017   Procedure: LAPAROSCOPIC CHOLECYSTECTOMY;  Surgeon: Ancil Linsey, MD;  Location: [facility] ORS;  Service: General;  Laterality: N/A;   RIGHT COLECTOMY  2009   at [facility] Medications    Prior to Admission medications   Medication Sig Start Date End Date Taking? Authorizing Provider  amitriptyline (ELAVIL) 50 MG tablet Take 50 mg by mouth at bedtime.  10/09/17   [provider]  ascorbic acid (VITAMIN  C) 500 MG tablet Take 1 tablet (500 mg total) by mouth daily. 05/01/19   Alford Highland, MD  aspirin EC 81 MG tablet Take 81 mg by mouth daily.    [provider]  atorvastatin (LIPITOR) 40 MG tablet Take 40 mg by mouth at bedtime. 08/15/17   [provider]  chlorthalidone (HYGROTON) 25 MG tablet Take 1 tablet (25 mg total) by mouth daily. 04/30/19   Alford Highland, MD  doxazosin (CARDURA) 1 MG tablet Take 1 mg by mouth daily. 10/25/17   [provider]  FLUoxetine (PROZAC) 20 MG capsule Take 20 mg by mouth daily.    [provider]  fluticasone (FLOVENT HFA) 110 MCG/ACT inhaler One inhalation twice a day (can substitute any steroid inhaler) 04/30/19   Alford Highland, MD  Ipratropium-Albuterol (COMBIVENT) 20-100 MCG/ACT AERS respimat Inhale 1 puff into the lungs every 6 (six) hours. 04/30/19   Alford Highland, MD  lisinopril (PRINIVIL,ZESTRIL) 20 MG tablet Take 20 mg by mouth daily. 09/12/17   [provider]  omeprazole (PRILOSEC) 20 MG capsule Take 20 mg by mouth daily. 10/09/17   [provider]  senna (SENOKOT) 8.6 MG TABS tablet Take 1 tablet (8.6 mg total) by mouth daily. 11/04/17   Katha Hamming, MD  zinc sulfate 220 (50 Zn) MG capsule Take 1 capsule (220 mg total) by mouth daily.  05/01/19   Alford Highland, MD    Family History Family History  Problem Relation Age of Onset   Diabetes Brother    CAD Father     Social History Social History   Tobacco Use   Smoking status: Former   Smokeless tobacco: Current    Types: Associate Professor Use: Never used  Substance Use Topics   Alcohol use: Not Currently   Drug use: Never     Allergies   Codeine   Review of Systems Review of Systems Per HPI  Physical Exam Triage Vital Signs ED Triage Vitals  Enc Vitals Group     BP 11/27/20 1047 (!) 144/84     Pulse Rate 11/27/20 1047 (!) 52     Resp 11/27/20 1047 18     Temp 11/27/20 1047 97.6 F (36.4 C)      Temp Source 11/27/20 1047 Oral     SpO2 11/27/20 1047 100 %     Weight --      Height --      Head Circumference --      Peak Flow --      Pain Score 11/27/20 1052 8     Pain Loc --      Pain Edu? --      Excl. in GC? --    Updated Vital Signs BP (!) 144/84   Pulse (!) 52   Temp 97.6 F (36.4 C) (Oral)   Resp 18   SpO2 100%   Visual Acuity Right Eye Distance:   Left Eye Distance:   Bilateral Distance:    Right Eye Near:   Left Eye Near:    Bilateral Near:     Physical Exam Constitutional:      General: He is not in acute distress.    Appearance: Normal appearance. He is not ill-appearing.  HENT:     Head: Normocephalic and atraumatic.     Ears:     Comments: Hard of hearing. Eyes:     General:        Right eye: No discharge.        Left eye: No discharge.     Conjunctiva/sclera: Conjunctivae normal.  Cardiovascular:     Rate and Rhythm: Regular rhythm. Bradycardia present.  Pulmonary:     Effort: Pulmonary effort is normal.     Breath sounds: Normal breath sounds. No wheezing, rhonchi or rales.  Musculoskeletal:     Comments: Decreased range of motion of the lumbar spine.  Tenderness over the paraspinal musculature of the right side of the lumbar spine  Neurological:     Mental Status: He is alert.  Psychiatric:        Mood and Affect: Mood normal.        Behavior: Behavior normal.     UC Treatments / Results  Labs (all labs ordered are listed, but only abnormal results are displayed) Labs Reviewed  COMPREHENSIVE METABOLIC PANEL - Abnormal; Notable for the following components:      Result Value   Sodium 125 (*)    Chloride 89 (*)    Total Protein 8.4 (*)    Alkaline Phosphatase 127 (*)    All other components within normal limits  CBC WITH DIFFERENTIAL/PLATELET  TROPONIN I (HIGH SENSITIVITY)  TROPONIN I (HIGH SENSITIVITY)    EKG Interpretation: Sinus bradycardia at the rate of 55.  First-degree AV block.  Right bundle branch block.  No ST or T  wave changes.  Radiology DG Lumbar Spine Complete  Result Date: 11/27/2020 CLINICAL DATA:  Low back pain.  Syncope with recent fall. EXAM: LUMBAR SPINE - COMPLETE 4+ VIEW COMPARISON:  CT AP 11/07/2017 FINDINGS: The alignment of the lumbar spine appears normal. The vertebral body heights are well preserved. No fracture identified. Mild ventral endplate spurring identified throughout the lumbar spine. Disc spaces are relatively well preserved. Facet degenerative change noted at the L5-S1 level. IMPRESSION: 1. No acute findings. 2. Mild degenerative changes. Electronically Signed   By: Signa Kell M.D.   On: 11/27/2020 12:15    Procedures Procedures (including critical care time)  Medications Ordered in UC Medications - No data to display  Initial Impression / Assessment and Plan / UC Course  I have reviewed the triage vital signs and the nursing notes.  Pertinent labs & imaging results that were available during my care of the patient were reviewed by me and considered in my medical decision making (see chart for details).    75 year old male presents with recent syncopal episode and low back pain.  EKG with no evidence of ischemia.  Laboratory studies revealed hyponatremia with a sodium of 125.  Associated hypochloremia.  CBC normal.  Troponin negative.  X-ray of the low back was obtained and was independently reviewed by me.  Interpretation: No acute findings.  It did reveal straightening of the lordotic curve.  I have advised the patient that given his significant hyponatremia and also due to recent syncopal episode, he needs to go to the hospital for further evaluation and management.  Patient is hemodynamically stable at this time.  He will be transported by his family member.  Final Clinical Impressions(s) / UC Diagnoses   Final diagnoses:  Hyponatremia  Syncope, unspecified syncope type  Acute right-sided low back pain without sciatica     Discharge Instructions      Patient  had a recent syncopal episode.  Patient is markedly hyponatremic.  He had negative x-rays of his low back (this is his primary complaint).  Patient needs IV fluids and close monitoring for the hyponatremia.  Everlene Other DO [facility]    ED Prescriptions   None    PDMP not reviewed this encounter.   Tommie Sams, DO 11/27/20 1310

## 2020-11-27 NOTE — ED Notes (Addendum)
See triage note  Presents with pain to right lower back and hip area  States he fell from chair on Monday   Has had cont's pain  Went to UC   And was sent here b/c of low NA

## 2020-11-27 NOTE — ED Triage Notes (Signed)
Pt here with right hip pain. Pt fell Monday and has been having pain since then. Pt still able to walk with the affected hip.

## 2020-11-27 NOTE — Discharge Instructions (Signed)
Follow-up with your PCP in the next week or 2 to have your sodium numbers rechecked again.  Use Tylenol for pain and fevers.  Up to 1000 mg per dose, up to 4 times per day.  Do not take more than 4000 mg of Tylenol/acetaminophen within 24 hours..  Please use lidocaine patches and your site of pain.  Apply 1 patch at a time, leave on for 12 hours, then remove for 12 hours.  12 hours on, 12 hours off.  Do not apply more than 1 patch at a time.  Return to the ED with any further worsening symptoms

## 2021-11-04 ENCOUNTER — Other Ambulatory Visit: Payer: Self-pay | Admitting: Family Medicine

## 2021-11-04 DIAGNOSIS — Z780 Asymptomatic menopausal state: Secondary | ICD-10-CM

## 2021-12-17 ENCOUNTER — Ambulatory Visit
Admission: RE | Admit: 2021-12-17 | Discharge: 2021-12-17 | Disposition: A | Discharge status: Home / Self Care | Payer: Medicare Other | Source: Ambulatory Visit | Attending: Family Medicine | Admitting: Family Medicine

## 2021-12-17 DIAGNOSIS — Z780 Asymptomatic menopausal state: Secondary | ICD-10-CM

## 2021-12-17 DIAGNOSIS — Z13820 Encounter for screening for osteoporosis: Secondary | ICD-10-CM | POA: Diagnosis not present

## 2023-01-26 ENCOUNTER — Other Ambulatory Visit: Payer: Self-pay | Admitting: Family Medicine

## 2023-01-26 DIAGNOSIS — R1084 Generalized abdominal pain: Secondary | ICD-10-CM

## 2023-01-30 ENCOUNTER — Other Ambulatory Visit: Payer: Self-pay | Admitting: Family Medicine

## 2023-01-30 DIAGNOSIS — Z8739 Personal history of other diseases of the musculoskeletal system and connective tissue: Secondary | ICD-10-CM

## 2023-01-30 DIAGNOSIS — M545 Low back pain, unspecified: Secondary | ICD-10-CM

## 2023-02-01 ENCOUNTER — Other Ambulatory Visit: Payer: Medicare Other

## 2023-02-01 ENCOUNTER — Ambulatory Visit: Admission: RE | Admit: 2023-02-01 | Payer: Medicare Other | Source: Ambulatory Visit

## 2023-02-21 ENCOUNTER — Ambulatory Visit
Admission: RE | Admit: 2023-02-21 | Discharge: 2023-02-21 | Disposition: A | Discharge status: Home / Self Care | Payer: Medicare Other | Source: Ambulatory Visit | Attending: Family Medicine | Admitting: Family Medicine

## 2023-02-21 DIAGNOSIS — R1084 Generalized abdominal pain: Secondary | ICD-10-CM

## 2023-02-21 LAB — POCT I-STAT CREATININE: Creatinine, Ser: 1 mg/dL (ref 0.61–1.24)

## 2023-02-21 MED ORDER — IOHEXOL 300 MG/ML  SOLN
100.0000 mL | Freq: Once | INTRAMUSCULAR | Status: AC | PRN
  Administered 2023-02-21: 100 mL via INTRAVENOUS

## 2023-12-12 ENCOUNTER — Observation Stay

## 2023-12-12 ENCOUNTER — Emergency Department

## 2023-12-12 ENCOUNTER — Inpatient Hospital Stay
Admission: EM | Admit: 2023-12-12 | Discharge: 2023-12-15 | DRG: 809 | Disposition: A | Discharge status: Home / Self Care | Attending: Hospitalist | Admitting: Hospitalist

## 2023-12-12 ENCOUNTER — Other Ambulatory Visit: Payer: Self-pay

## 2023-12-12 ENCOUNTER — Encounter: Payer: Self-pay | Admitting: Intensive Care

## 2023-12-12 DIAGNOSIS — Z833 Family history of diabetes mellitus: Secondary | ICD-10-CM

## 2023-12-12 DIAGNOSIS — D649 Anemia, unspecified: Secondary | ICD-10-CM

## 2023-12-12 DIAGNOSIS — E871 Hypo-osmolality and hyponatremia: Secondary | ICD-10-CM | POA: Diagnosis present

## 2023-12-12 DIAGNOSIS — R739 Hyperglycemia, unspecified: Secondary | ICD-10-CM | POA: Diagnosis not present

## 2023-12-12 DIAGNOSIS — D61818 Other pancytopenia: Secondary | ICD-10-CM | POA: Diagnosis not present

## 2023-12-12 DIAGNOSIS — R0600 Dyspnea, unspecified: Secondary | ICD-10-CM

## 2023-12-12 DIAGNOSIS — Z7951 Long term (current) use of inhaled steroids: Secondary | ICD-10-CM

## 2023-12-12 DIAGNOSIS — D591 Autoimmune hemolytic anemia, unspecified: Secondary | ICD-10-CM | POA: Diagnosis present

## 2023-12-12 DIAGNOSIS — Z9049 Acquired absence of other specified parts of digestive tract: Secondary | ICD-10-CM

## 2023-12-12 DIAGNOSIS — Z602 Problems related to living alone: Secondary | ICD-10-CM | POA: Diagnosis present

## 2023-12-12 DIAGNOSIS — K219 Gastro-esophageal reflux disease without esophagitis: Secondary | ICD-10-CM | POA: Diagnosis present

## 2023-12-12 DIAGNOSIS — Z8249 Family history of ischemic heart disease and other diseases of the circulatory system: Secondary | ICD-10-CM

## 2023-12-12 DIAGNOSIS — E785 Hyperlipidemia, unspecified: Secondary | ICD-10-CM | POA: Diagnosis present

## 2023-12-12 DIAGNOSIS — I10 Essential (primary) hypertension: Secondary | ICD-10-CM | POA: Diagnosis present

## 2023-12-12 DIAGNOSIS — F17220 Nicotine dependence, chewing tobacco, uncomplicated: Secondary | ICD-10-CM | POA: Diagnosis present

## 2023-12-12 DIAGNOSIS — K7581 Nonalcoholic steatohepatitis (NASH): Secondary | ICD-10-CM | POA: Diagnosis present

## 2023-12-12 DIAGNOSIS — Z7982 Long term (current) use of aspirin: Secondary | ICD-10-CM

## 2023-12-12 DIAGNOSIS — R0602 Shortness of breath: Secondary | ICD-10-CM | POA: Diagnosis not present

## 2023-12-12 DIAGNOSIS — Z885 Allergy status to narcotic agent status: Secondary | ICD-10-CM

## 2023-12-12 DIAGNOSIS — Z79899 Other long term (current) drug therapy: Secondary | ICD-10-CM

## 2023-12-12 LAB — CBC WITH DIFFERENTIAL/PLATELET
Abs Immature Granulocytes: 0.05 K/uL (ref 0.00–0.07)
Basophils Absolute: 0 K/uL (ref 0.0–0.1)
Basophils Relative: 1 %
Eosinophils Absolute: 0.2 K/uL (ref 0.0–0.5)
Eosinophils Relative: 5 %
HCT: 20.3 % — ABNORMAL LOW (ref 39.0–52.0)
Hemoglobin: 7.2 g/dL — ABNORMAL LOW (ref 13.0–17.0)
Immature Granulocytes: 1 %
Lymphocytes Relative: 31 %
Lymphs Abs: 1.1 K/uL (ref 0.7–4.0)
MCH: 36.9 pg — ABNORMAL HIGH (ref 26.0–34.0)
MCHC: 35.5 g/dL (ref 30.0–36.0)
MCV: 104.1 fL — ABNORMAL HIGH (ref 80.0–100.0)
Monocytes Absolute: 0.3 K/uL (ref 0.1–1.0)
Monocytes Relative: 8 %
Neutro Abs: 1.9 K/uL (ref 1.7–7.7)
Neutrophils Relative %: 54 %
Platelets: 112 K/uL — ABNORMAL LOW (ref 150–400)
RBC: 1.95 MIL/uL — ABNORMAL LOW (ref 4.22–5.81)
RDW: 21.3 % — ABNORMAL HIGH (ref 11.5–15.5)
Smear Review: NORMAL
WBC: 3.6 K/uL — ABNORMAL LOW (ref 4.0–10.5)
nRBC: 0.6 % — ABNORMAL HIGH (ref 0.0–0.2)

## 2023-12-12 LAB — COMPREHENSIVE METABOLIC PANEL WITH GFR
ALT: 70 U/L — ABNORMAL HIGH (ref 0–44)
AST: 85 U/L — ABNORMAL HIGH (ref 15–41)
Albumin: 3.2 g/dL — ABNORMAL LOW (ref 3.5–5.0)
Alkaline Phosphatase: 118 U/L (ref 38–126)
Anion gap: 6 (ref 5–15)
BUN: 13 mg/dL (ref 8–23)
CO2: 26 mmol/L (ref 22–32)
Calcium: 9.2 mg/dL (ref 8.9–10.3)
Chloride: 99 mmol/L (ref 98–111)
Creatinine, Ser: 0.99 mg/dL (ref 0.61–1.24)
GFR, Estimated: >60 mL/min (ref >60)
Glucose, Bld: 105 mg/dL — ABNORMAL HIGH (ref 70–99)
Potassium: 3.8 mmol/L (ref 3.5–5.1)
Sodium: 131 mmol/L — ABNORMAL LOW (ref 135–145)
Total Bilirubin: 4.2 mg/dL — ABNORMAL HIGH (ref 0.0–1.2)
Total Protein: 6.7 g/dL (ref 6.5–8.1)

## 2023-12-12 LAB — FOLATE: Folate: 6.9 ng/mL (ref >5.9)

## 2023-12-12 LAB — TECHNOLOGIST SMEAR REVIEW: Plt Morphology: NORMAL

## 2023-12-12 LAB — RAPID HIV SCREEN (HIV 1/2 AB+AG)
HIV 1/2 Antibodies: NONREACTIVE
HIV-1 P24 Antigen - HIV24: NONREACTIVE

## 2023-12-12 LAB — IRON AND TIBC
Iron: 238 ug/dL — ABNORMAL HIGH (ref 45–182)
Saturation Ratios: 62 % — ABNORMAL HIGH (ref 17.9–39.5)
TIBC: 382 ug/dL (ref 250–450)
UIBC: 144 ug/dL

## 2023-12-12 LAB — AMMONIA: Ammonia: 25 umol/L (ref 9–35)

## 2023-12-12 LAB — BILIRUBIN, FRACTIONATED(TOT/DIR/INDIR)
Bilirubin, Direct: 0.9 mg/dL — ABNORMAL HIGH (ref 0.0–0.2)
Indirect Bilirubin: 3.2 mg/dL — ABNORMAL HIGH (ref 0.3–0.9)
Total Bilirubin: 4.1 mg/dL — ABNORMAL HIGH (ref 0.0–1.2)

## 2023-12-12 LAB — RETICULOCYTES
Immature Retic Fract: 15.7 % (ref 2.3–15.9)
RBC.: 1.9 MIL/uL — ABNORMAL LOW (ref 4.22–5.81)
Retic Count, Absolute: 145 K/uL (ref 19.0–186.0)
Retic Ct Pct: 7.6 % — ABNORMAL HIGH (ref 0.4–3.1)

## 2023-12-12 LAB — FERRITIN: Ferritin: 221 ng/mL (ref 24–336)

## 2023-12-12 LAB — LACTATE DEHYDROGENASE: LDH: 387 U/L — ABNORMAL HIGH (ref 98–192)

## 2023-12-12 LAB — VITAMIN B12: Vitamin B-12: 344 pg/mL (ref 180–914)

## 2023-12-12 LAB — TSH: TSH: 5.453 u[IU]/mL — ABNORMAL HIGH (ref 0.350–4.500)

## 2023-12-12 MED ORDER — SODIUM CHLORIDE 0.9 % IV BOLUS
1000.0000 mL | Freq: Once | INTRAVENOUS | Status: AC
  Administered 2023-12-12: 1000 mL via INTRAVENOUS

## 2023-12-12 MED ORDER — IOHEXOL 350 MG/ML SOLN
100.0000 mL | Freq: Once | INTRAVENOUS | Status: AC | PRN
  Administered 2023-12-12: 100 mL via INTRAVENOUS

## 2023-12-12 MED ORDER — PREDNISONE 50 MG PO TABS
80.0000 mg | ORAL_TABLET | Freq: Every day | ORAL | Status: DC
  Administered 2023-12-12: 80 mg via ORAL
  Filled 2023-12-12: qty 1

## 2023-12-12 MED ORDER — PANTOPRAZOLE SODIUM 40 MG PO TBEC
40.0000 mg | DELAYED_RELEASE_TABLET | Freq: Every day | ORAL | Status: DC
  Administered 2023-12-12 – 2023-12-15 (×4): 40 mg via ORAL
  Filled 2023-12-12 (×4): qty 1

## 2023-12-12 NOTE — H&P (Signed)
 History and Physical    Patient: Brian Montes FMW:[id number] DOB: 1946-04-10 DOA: 12/12/2023 DOS: the patient was seen and examined on 12/12/2023 PCP: [facility]  Patient coming from: Home  Chief Complaint:  Chief Complaint  Patient presents with   Abnormal Lab   HPI: Brian Montes is a 78 y.o. male who is unable to report his medical history.  Per chart review I am able to glean that patient has history of cataracts, GERD, hyponatremia, depression, hyperlipidemia, NASH,   Patient presents to the ED at the urging of his primary care physician for abnormal labs.  Patient reports he has been feeling progressively weak and tired over the last month.  He also endorses some weight loss.  He denies any night sweats.  He reports he currently lives alone in [place].  He endorses a tobacco abuse history of 40 years 1 pack/day, but reports he quit 15 years ago.  Denies any illicit drugs or alcohol.  Labs in the ED consistent with pancytopenia, WBC 3.6, hemoglobin 7.2, platelets 112.  Sodium 131, albumin 3.2, elevation in AST and ALT and total bilirubin of 4.2.  LDH also elevated at 387.  Iron levels are elevated, iron sat ratio is also elevated.  CXR unremarkable.  Pan scan ordered and pending at this time.  Patient appears disoriented, I attempted multiple times to reach his son, Curtistine by phone without answer.  Review of Systems: As mentioned in the history of present illness. All other systems reviewed and are negative. Past Medical History:  Diagnosis Date   Acid reflux    Calculus of gallbladder with acute and chronic cholecystitis without obstruction    Essential hypertension    Hyperlipidemia due to dietary fat intake    Past Surgical History:  Procedure Laterality Date   CARDIAC CATHETERIZATION  2010   Dr. Bosie - NORMAL CORONARIES   CHOLECYSTECTOMY N/A 11/02/2017   Procedure: LAPAROSCOPIC CHOLECYSTECTOMY;  Surgeon: [name], MD;  Location: [facility] ORS;   Service: General;  Laterality: N/A;   RIGHT COLECTOMY  2009   at [facility]   Social History:  reports that he has quit smoking. His smokeless tobacco use includes chew. He reports that he does not currently use alcohol. He reports that he does not use drugs.  Allergies  Allergen Reactions   Codeine Other (See Comments)    Family History  Problem Relation Age of Onset   Diabetes Brother    CAD Father     Prior to Admission medications   Medication Sig Start Date End Date Taking? Authorizing Provider  amitriptyline  (ELAVIL ) 50 MG tablet Take 50 mg by mouth at bedtime.  10/09/17   [provider]  ascorbic acid  (VITAMIN C) 500 MG tablet Take 1 tablet (500 mg total) by mouth daily. 05/01/19   Josette Ade, MD  aspirin  EC 81 MG tablet Take 81 mg by mouth daily.    [provider]  atorvastatin  (LIPITOR) 40 MG tablet Take 40 mg by mouth at bedtime. 08/15/17   [provider]  chlorthalidone  (HYGROTON ) 25 MG tablet Take 1 tablet (25 mg total) by mouth daily. 04/30/19   Josette Ade, MD  doxazosin  (CARDURA ) 1 MG tablet Take 1 mg by mouth daily. 10/25/17   [provider]  FLUoxetine  (PROZAC ) 20 MG capsule Take 20 mg by mouth daily.    [provider]  fluticasone  (FLOVENT  HFA) 110 MCG/ACT inhaler One inhalation twice a day (can substitute any steroid inhaler) 04/30/19  Josette Ade, MD  Ipratropium-Albuterol  (COMBIVENT ) 20-100 MCG/ACT AERS respimat Inhale 1 puff into the lungs every 6 (six) hours. 04/30/19   Josette Ade, MD  lisinopril  (PRINIVIL ,ZESTRIL ) 20 MG tablet Take 20 mg by mouth daily. 09/12/17   [provider]  omeprazole (PRILOSEC) 20 MG capsule Take 20 mg by mouth daily. 10/09/17   [provider]  senna (SENOKOT) 8.6 MG TABS tablet Take 1 tablet (8.6 mg total) by mouth daily. 11/04/17   Konidena, Snehalatha, MD  zinc  sulfate 220 (50 Zn) MG capsule Take 1 capsule (220 mg total) by mouth daily. 05/01/19   Josette Ade, MD    Physical Exam: Vitals:   12/12/23 0934 12/12/23 1030 12/12/23 1100  BP: (!) 122/55 (!) 105/53 126/60  Pulse: 65 (!) 56 (!) 56  Resp: 16 18 18   Temp: 98 F (36.7 C)    TempSrc: Oral    SpO2: 99% 100% 100%  Weight: 86.6 kg    Height: 5' 9 (1.753 m)     Constitutional:  Normal appearance. Non toxic-appearing.  HENT: Head Normocephalic and atraumatic.  Mucous membranes are moist.  Eyes:  Extraocular intact. Conjunctivae normal. Pupils are equal, round, and reactive to light.  Cardiovascular: Rate and Rhythm: Normal rate and regular rhythm.  Pulmonary: Non labored, symmetric rise of chest wall.  Musculoskeletal:  Normal range of motion.  Skin: warm and dry. not jaundiced.  Neurological: Alert, oriented to self, place, city.  Disoriented to year and president. Psychiatric: Mood and Affect congruent.   Data Reviewed:     Latest Ref Rng & Units 12/12/2023    9:37 AM 02/21/2023    4:45 PM 11/27/2020    3:38 PM  BMP  Glucose 70 - 99 mg/dL 894   889   BUN 8 - 23 mg/dL 13   16   Creatinine 9.38 - 1.24 mg/dL 9.00  8.99  8.90   Sodium 135 - 145 mmol/L 131   130   Potassium 3.5 - 5.1 mmol/L 3.8   4.3   Chloride 98 - 111 mmol/L 99   94   CO2 22 - 32 mmol/L 26   29   Calcium  8.9 - 10.3 mg/dL 9.2   9.6       Latest Ref Rng & Units 12/12/2023    9:37 AM 11/27/2020   11:13 AM 04/30/2019    6:45 AM  CBC  WBC 4.0 - 10.5 K/uL 3.6  7.6  2.2   Hemoglobin 13.0 - 17.0 g/dL 7.2  85.3  88.3   Hematocrit 39.0 - 52.0 % 20.3  41.0  35.1   Platelets 150 - 400 K/uL 112  155  135      Assessment and Plan: Pancytopenia Leukopenia Thrombocytopenia Anemia - All cell lines down on arrival, LDL elevated.  Reticulocyte count elevated - WBC morphology unremarkable, normal platelet morphology, ovalocytes present. - CXR unremarkable - CT chest abdomen pelvis ordered and pending, suspect malignancy - Consult hematology - Will type and screen, transfuse as needed.  Hyponatremia -  Appears this is chronic - Encourage p.o. intake  Elevated TSH - T4 pending  Presumed hyperlipidemia and hypertension - Patient is disoriented and is unable to report his past medical history.  I have been unable to reach his family for collateral information.  He does report he lives alone.  On chart review from 2017 he had these problems though he reports he is not currently taking any of these medications. - Fill history does show current prescriptions for  hydrochlorothiazide, lisinopril , metoprolol , atorvastatin  - Follow blood pressure closely, gradually resume medications as tolerated    Advance Care Planning: Will default to full code at this time.  Patient does not appear to have understanding of his entire situation in order to make this decision on his own.  Consults: Oncology  Family Communication: Attempted to reach son, Curtistine, multiple times.  Left a voicemail.  No return call as of yet  Severity of Illness: The appropriate patient status for this patient is OBSERVATION. Observation status is judged to be reasonable and necessary in order to provide the required intensity of service to ensure the patient's safety. The patient's presenting symptoms, physical exam findings, and initial radiographic and laboratory data in the context of their medical condition is felt to place them at decreased risk for further clinical deterioration. Furthermore, it is anticipated that the patient will be medically stable for discharge from the hospital within 2 midnights of admission.   Author: Kelin Borum, DO 12/12/2023 11:35 AM  For on call review [email or web address].

## 2023-12-12 NOTE — ED Notes (Signed)
 MD at bedside.

## 2023-12-12 NOTE — ED Provider Notes (Addendum)
 [facility] Provider Note    Event Date/Time   First MD Initiated Contact with Patient 12/12/23 [id number]     (approximate)   History   Abnormal Lab   HPI  Brian Montes is a 78 y.o. male past 3 significant for hypertension, hyperlipidemia, who presents to the emergency department for lab work abnormality.  Patient went to get routine labs done yesterday and was called and told to come to the emergency department today.  States that he got an appointment after he had not been feeling well for the past 3 weeks.  Complaining of generalized fatigue, weakness and shortness of breath.  States that he has been feeling very rundown.  Gets very short of breath with only walking short distances.  Denies any night sweats or fever.  Does endorse some weight loss but he is uncertain of how much.  Denies any nausea, vomiting or diarrhea.  No abdominal pain.  No chest pain.  No cough.  No tobacco use or alcohol use.  Remote history of tobacco use when he was a child.     Physical Exam   Triage Vital Signs: ED Triage Vitals [12/12/23 0934]  Encounter Vitals Group     BP (!) 122/55     Girls Systolic BP Percentile      Girls Diastolic BP Percentile      Boys Systolic BP Percentile      Boys Diastolic BP Percentile      Pulse Rate 65     Resp 16     Temp 98 F (36.7 C)     Temp Source Oral     SpO2 99 %     Weight 191 lb (86.6 kg)     Height 5' 9 (1.753 m)     Head Circumference      Peak Flow      Pain Score 0     Pain Loc      Pain Education      Exclude from Growth Chart     Most recent vital signs: Vitals:   12/12/23 1030 12/12/23 1100  BP: (!) 105/53 126/60  Pulse: (!) 56 (!) 56  Resp: 18 18  Temp:    SpO2: 100% 100%    Physical Exam Exam conducted with a chaperone present.  Constitutional:      Appearance: He is well-developed.  HENT:     Head: Atraumatic.  Eyes:     Conjunctiva/sclera: Conjunctivae normal.  Cardiovascular:     Rate and  Rhythm: Regular rhythm.  Pulmonary:     Effort: No respiratory distress.  Abdominal:     Tenderness: There is no abdominal tenderness. There is no right CVA tenderness or left CVA tenderness.  Genitourinary:    Comments: Rectal exam with no gross blood or melena Musculoskeletal:     Cervical back: Normal range of motion.  Skin:    General: Skin is warm.     Capillary Refill: Capillary refill takes less than 2 seconds.  Neurological:     Mental Status: He is alert. Mental status is at baseline.  Psychiatric:        Mood and Affect: Mood normal.     IMPRESSION / MDM / ASSESSMENT AND PLAN / ED COURSE  I reviewed the triage vital signs and the nursing notes.  Differential diagnosis including dehydration, malignancy, GI bleed, symptomatic anemia, pneumonia   EKG  I, Clotilda Punter, the attending physician, personally viewed and interpreted this ECG.  EKG  showed normal sinus rhythm.  Prolonged PR interval with first-degree block.  No significant ST elevation or depression.  QTc 463.  No findings of acute ischemia.  No tachycardic or bradycardic dysrhythmias while on cardiac telemetry.  RADIOLOGY I independently reviewed imaging, my interpretation of imaging: Chest x-ray  LABS (all labs ordered are listed, but only abnormal results are displayed) Labs interpreted as -    Labs Reviewed  CBC WITH DIFFERENTIAL/PLATELET - Abnormal; Notable for the following components:      Result Value   WBC 3.6 (*)    RBC 1.95 (*)    Hemoglobin 7.2 (*)    HCT 20.3 (*)    MCV 104.1 (*)    MCH 36.9 (*)    RDW 21.3 (*)    Platelets 112 (*)    nRBC 0.6 (*)    All other components within normal limits  COMPREHENSIVE METABOLIC PANEL WITH GFR - Abnormal; Notable for the following components:   Sodium 131 (*)    Glucose, Bld 105 (*)    Albumin 3.2 (*)    AST 85 (*)    ALT 70 (*)    Total Bilirubin 4.2 (*)    All other components within normal limits  TYPE AND SCREEN     MDM  Multiple  lab work abnormalities with pancytopenia with a white count of 3.6, hemoglobin is 7.2 with appears to have a baseline of around 14, last lab work I can see in our chart is from 2022.  Platelets are low at 112 but does appear has been low in the past.  Mild hyponatremia, appears euvolemic.  Will give 1 L of IV fluids.  Elevation of AST and ALT with a T. bili of 4.2.  No nausea or vomiting and no abdominal pain, have a low suspicion for acute cholecystitis.  No signs or symptoms of a GI bleed.  Does have symptomatic anemia.  Added on a chest x-ray to further evaluate for pneumonia given his shortness of breath.  Order a CTA to further evaluate for pulmonary embolism and a CT abdomen and pelvis with contrast to further evaluate for possible lymphoma or source of malignancy or a mass given elevated LFTs and T. bili.  Discussed with hospitalist who will follow-up the results.  Plan to consult hospitalist for further workup for pancytopenia, elevated T. bili and shortness of breath.     PROCEDURES:  Critical Care performed: No  Procedures  Patient's presentation is most consistent with acute presentation with potential threat to life or bodily function.   MEDICATIONS ORDERED IN ED: Medications  sodium chloride  0.9 % bolus 1,000 mL (1,000 mLs Intravenous New Bag/Given 12/12/23 1106)    FINAL CLINICAL IMPRESSION(S) / ED DIAGNOSES   Final diagnoses:  Pancytopenia (HCC)  Symptomatic anemia  Dyspnea, unspecified type     Rx / DC Orders   ED Discharge Orders     None        Note:  This document was prepared using Dragon voice recognition software and may include unintentional dictation errors.   Suzanne Kirsch, MD 12/12/23 1114    Suzanne Kirsch, MD 12/12/23 [id number]

## 2023-12-12 NOTE — ED Notes (Signed)
 Called the floor to notify of patient's soon arrival

## 2023-12-12 NOTE — ED Triage Notes (Addendum)
 Patient reports abnormal labs after getting them drawn with PCP. Reports low blood levels

## 2023-12-12 NOTE — Plan of Care (Signed)
   Problem: Education: Goal: Knowledge of General Education information will improve Description Including pain rating scale, medication(s)/side effects and non-pharmacologic comfort measures Outcome: Progressing   Problem: Health Behavior/Discharge Planning: Goal: Ability to manage health-related needs will improve Outcome: Progressing

## 2023-12-13 ENCOUNTER — Telehealth: Payer: Self-pay | Admitting: Internal Medicine

## 2023-12-13 DIAGNOSIS — D61818 Other pancytopenia: Secondary | ICD-10-CM | POA: Diagnosis not present

## 2023-12-13 LAB — CBC WITH DIFFERENTIAL/PLATELET
Abs Immature Granulocytes: 0.03 K/uL (ref 0.00–0.07)
Basophils Absolute: 0 K/uL (ref 0.0–0.1)
Basophils Relative: 1 %
Eosinophils Absolute: 0 K/uL (ref 0.0–0.5)
Eosinophils Relative: 1 %
HCT: 17.7 % — ABNORMAL LOW (ref 39.0–52.0)
Hemoglobin: 6.1 g/dL — ABNORMAL LOW (ref 13.0–17.0)
Immature Granulocytes: 1 %
Lymphocytes Relative: 20 %
Lymphs Abs: 0.5 K/uL — ABNORMAL LOW (ref 0.7–4.0)
MCH: 36.7 pg — ABNORMAL HIGH (ref 26.0–34.0)
MCHC: 34.5 g/dL (ref 30.0–36.0)
MCV: 106.6 fL — ABNORMAL HIGH (ref 80.0–100.0)
Monocytes Absolute: 0.1 K/uL (ref 0.1–1.0)
Monocytes Relative: 4 %
Neutro Abs: 1.7 K/uL (ref 1.7–7.7)
Neutrophils Relative %: 73 %
Platelets: 117 K/uL — ABNORMAL LOW (ref 150–400)
RBC: 1.66 MIL/uL — ABNORMAL LOW (ref 4.22–5.81)
RDW: 20.9 % — ABNORMAL HIGH (ref 11.5–15.5)
WBC: 2.3 K/uL — ABNORMAL LOW (ref 4.0–10.5)
nRBC: 0 % (ref 0.0–0.2)

## 2023-12-13 LAB — COMPREHENSIVE METABOLIC PANEL WITH GFR
ALT: 60 U/L — ABNORMAL HIGH (ref 0–44)
AST: 69 U/L — ABNORMAL HIGH (ref 15–41)
Albumin: 2.8 g/dL — ABNORMAL LOW (ref 3.5–5.0)
Alkaline Phosphatase: 113 U/L (ref 38–126)
Anion gap: 8 (ref 5–15)
BUN: 15 mg/dL (ref 8–23)
CO2: 23 mmol/L (ref 22–32)
Calcium: 8.9 mg/dL (ref 8.9–10.3)
Chloride: 100 mmol/L (ref 98–111)
Creatinine, Ser: 0.9 mg/dL (ref 0.61–1.24)
GFR, Estimated: >60 mL/min (ref >60)
Glucose, Bld: 146 mg/dL — ABNORMAL HIGH (ref 70–99)
Potassium: 4.2 mmol/L (ref 3.5–5.1)
Sodium: 131 mmol/L — ABNORMAL LOW (ref 135–145)
Total Bilirubin: 3.7 mg/dL — ABNORMAL HIGH (ref 0.0–1.2)
Total Protein: 6.1 g/dL — ABNORMAL LOW (ref 6.5–8.1)

## 2023-12-13 LAB — PREPARE RBC (CROSSMATCH)

## 2023-12-13 LAB — PHOSPHORUS: Phosphorus: 2.6 mg/dL (ref 2.5–4.6)

## 2023-12-13 LAB — RPR: RPR Ser Ql: NONREACTIVE

## 2023-12-13 LAB — MAGNESIUM: Magnesium: 1.8 mg/dL (ref 1.7–2.4)

## 2023-12-13 LAB — DIRECT ANTIGLOBULIN TEST (NOT AT ARMC)
DAT, IgG: POSITIVE
DAT, complement: NEGATIVE

## 2023-12-13 MED ORDER — ENOXAPARIN SODIUM 40 MG/0.4ML IJ SOSY
40.0000 mg | PREFILLED_SYRINGE | Freq: Every day | INTRAMUSCULAR | Status: DC
  Administered 2023-12-13 – 2023-12-14 (×2): 40 mg via SUBCUTANEOUS
  Filled 2023-12-13 (×2): qty 0.4

## 2023-12-13 MED ORDER — FOLIC ACID 1 MG PO TABS
1.0000 mg | ORAL_TABLET | Freq: Every day | ORAL | Status: DC
  Administered 2023-12-13 – 2023-12-15 (×3): 1 mg via ORAL
  Filled 2023-12-13 (×3): qty 1

## 2023-12-13 MED ORDER — SODIUM CHLORIDE 0.9 % IV SOLN
1000.0000 mg | INTRAVENOUS | Status: AC
  Administered 2023-12-13 – 2023-12-15 (×3): 1000 mg via INTRAVENOUS
  Filled 2023-12-13 (×3): qty 16

## 2023-12-13 MED ORDER — SODIUM CHLORIDE 0.9% IV SOLUTION: Freq: Once | INTRAVENOUS | Status: AC

## 2023-12-13 NOTE — Telephone Encounter (Signed)
 I tried reaching out to patient's son Anthony-phone rings; however unable to connect.   GB

## 2023-12-13 NOTE — Care Management Obs Status (Signed)
 MEDICARE OBSERVATION STATUS NOTIFICATION   Patient Details  Name: Brian Montes MRN: [id number] Date of Birth: Dec 19, 1945   Medicare Observation Status Notification Given:  Yes    Rebekha Diveley W, CMA 12/13/2023, 10:48 AM

## 2023-12-13 NOTE — Consult Note (Signed)
 [facility] CONSULT NOTE  Patient Care Team: Center, [facility] as PCP - General (General Practice)  CHIEF COMPLAINTS/PURPOSE OF CONSULTATION: Anemia  Oncology History   No history exists.    HISTORY OF PRESENTING ILLNESS: Patient in sitting in the chair   alone.  Brian Montes 78 y.o.  male pleasant patient with no prior history of anemia is currently admitted to hospital for severe anemia.  Patient had a recent follow-up with PCP that showed abnormal labs-will serve to further evaluation emergency room.  Patient reports he has been feeling progressively weak and tired over the last month.  He has a good appetite.  Denies any significant weight loss.  He denies any night sweats.  Patient denies any blood in stools or black-colored stools.  Patient noted to have hemoglobin of 7.2 on admission LDH was elevated-indirect bilirubin was elevated.  Normal renal function.  LFTs mildly elevated.  Hematology has been consulted for evaluation of anemia.   Review of Systems  Constitutional:  Positive for malaise/fatigue. Negative for chills, diaphoresis, fever and weight loss.  HENT:  Negative for nosebleeds and sore throat.   Eyes:  Negative for double vision.  Respiratory:  Positive for shortness of breath. Negative for cough, hemoptysis, sputum production and wheezing.   Cardiovascular:  Negative for chest pain, palpitations, orthopnea and leg swelling.  Gastrointestinal:  Negative for abdominal pain, blood in stool, constipation, diarrhea, heartburn, melena, nausea and vomiting.  Genitourinary:  Negative for dysuria, frequency and urgency.  Musculoskeletal:  Negative for back pain and joint pain.  Skin: Negative.  Negative for itching and rash.  Neurological:  Negative for dizziness, tingling, focal weakness, weakness and headaches.  Endo/Heme/Allergies:  Does not bruise/bleed easily.  Psychiatric/Behavioral:  Negative for depression. The patient is not  nervous/anxious and does not have insomnia.     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 Bosie - NORMAL CORONARIES   CHOLECYSTECTOMY N/A 11/02/2017   Procedure: LAPAROSCOPIC CHOLECYSTECTOMY;  Surgeon: [name], MD;  Location: [facility] ORS;  Service: General;  Laterality: N/A;   RIGHT COLECTOMY  2009   at [facility]    SOCIAL HISTORY: Social History   Socioeconomic History   Marital status: Married    Spouse name: Not on file   Number of children: Not on file   Years of education: Not on file   Highest education level: Not on file  Occupational History   Not on file  Tobacco Use   Smoking status: Former   Smokeless tobacco: Current    Types: Chew  Vaping Use   Vaping status: Never Used  Substance and Sexual Activity   Alcohol use: Not Currently   Drug use: Never   Sexual activity: Not on file  Other Topics Concern   Not on file  Social History Narrative   Not on file   Social Drivers of Health   Financial Resource Strain: Not on file  Food Insecurity: No Food Insecurity (12/12/2023)   Hunger Vital Sign    Worried About Running Out of Food in the Last Year: Never true    Ran Out of Food in the Last Year: Never true  Transportation  Needs: No Transportation Needs (12/12/2023)   PRAPARE - Administrator, Civil Service (Medical): No    Lack of Transportation (Non-Medical): No  Physical Activity: Not on file  Stress: Not on file  Social Connections: Unknown (12/12/2023)   Social Connection and Isolation Panel    Frequency of Communication with Friends and Family: More than three times a week    Frequency of Social Gatherings with Friends and Family: More than three  times a week    Attends Religious Services: Patient declined    Database administrator or Organizations: Patient declined    Attends Banker Meetings: Patient declined    Marital Status: Patient declined  Intimate Partner Violence: Not At Risk (12/12/2023)   Humiliation, Afraid, Rape, and Kick questionnaire    Fear of Current or Ex-Partner: No    Emotionally Abused: No    Physically Abused: No    Sexually Abused: No    FAMILY HISTORY: Family History  Problem Relation Age of Onset   Diabetes Brother    CAD Father     ALLERGIES:  is allergic to codeine.  MEDICATIONS:  Current Facility-Administered Medications  Medication Dose Route Frequency Provider Last Rate Last Admin   folic acid  (FOLVITE ) tablet 1 mg  1 mg Oral Daily Derold Dorsch R, MD   1 mg at 12/13/23 1057   methylPREDNISolone  sodium succinate (SOLU-MEDROL ) 1,000 mg in sodium chloride  0.9 % 50 mL IVPB  1,000 mg Intravenous Q24H Chiyeko Ferre R, MD 66 mL/hr at 12/13/23 1143 1,000 mg at 12/13/23 1143   pantoprazole  (PROTONIX ) EC tablet 40 mg  40 mg Oral Daily Dezii, Alexandra, DO   40 mg at 12/13/23 1057    PHYSICAL EXAMINATION:   Vitals:   12/13/23 1220 12/13/23 1334  BP: (!) 102/47 (!) 133/58  Pulse: (!) 59 70  Resp: 18 18  Temp: 98.2 F (36.8 C) 98 F (36.7 C)  SpO2: 96% 95%   Filed Weights   12/12/23 0934  Weight: 191 lb (86.6 kg)    Physical Exam Vitals and nursing note reviewed.  HENT:     Head: Normocephalic and atraumatic.     Mouth/Throat:     Pharynx: Oropharynx is clear.  Eyes:     Extraocular Movements: Extraocular movements intact.     Pupils: Pupils are equal, round, and reactive to light.  Cardiovascular:     Rate and Rhythm: Normal rate and regular rhythm.  Pulmonary:     Comments: Decreased breath sounds bilaterally.  Abdominal:     Palpations: Abdomen is soft.  Musculoskeletal:        General: Normal range of motion.     Cervical back: Normal range of  motion.  Skin:    General: Skin is warm.  Neurological:     General: No focal deficit present.     Mental Status: He is alert and oriented to person, place, and time.  Psychiatric:        Behavior: Behavior normal.        Judgment: Judgment normal.     LABORATORY DATA:  I have reviewed the data as listed Lab Results  Component Value Date   WBC 2.3 (L) 12/13/2023   HGB 6.1 (L) 12/13/2023   HCT 17.7 (L) 12/13/2023   MCV 106.6 (H) 12/13/2023   PLT 117 (L) 12/13/2023   Recent Labs    02/21/23 1645 12/12/23 0937 12/12/23 1656 12/13/23 0635  NA  --  131*  --  131*  K  --  3.8  --  4.2  CL  --  99  --  100  CO2  --  26  --  23  GLUCOSE  --  105*  --  146*  BUN  --  13  --  15  CREATININE 1.00 0.99  --  0.90  CALCIUM   --  9.2  --  8.9  GFRNONAA  --  >60  --  >60  PROT  --  6.7  --  6.1*  ALBUMIN  --  3.2*  --  2.8*  AST  --  85*  --  69*  ALT  --  70*  --  60*  ALKPHOS  --  118  --  113  BILITOT  --  4.2* 4.1* 3.7*  BILIDIR  --   --  0.9*  --   IBILI  --   --  3.2*  --     RADIOGRAPHIC STUDIES: I have personally reviewed the radiological images as listed and agreed with the findings in the report. CT ABDOMEN PELVIS W CONTRAST Result Date: 12/12/2023 CLINICAL DATA:  Evaluation of metastatic disease. History of hypertension, hyperlipidemia, and abnormal labs. EXAM: CT ABDOMEN AND PELVIS WITH CONTRAST TECHNIQUE: Multidetector CT imaging of the abdomen and pelvis was performed using the standard protocol following bolus administration of intravenous contrast. RADIATION DOSE REDUCTION: This exam was performed according to the departmental dose-optimization program which includes automated exposure control, adjustment of the mA and/or kV according to patient size and/or use of iterative reconstruction technique. CONTRAST:  OMNIPAQUE  IOHEXOL  350 MG/ML SOLN COMPARISON:  02/21/2023 FINDINGS: Lower chest: Small bilateral pleural effusions with basilar atelectasis. Hepatobiliary:  No focal liver lesions. Mild periportal edema may indicate infectious or inflammatory process such as hepatitis. Surgical absence of the gallbladder. No bile duct dilatation. Pancreas: Unremarkable. No pancreatic ductal dilatation or surrounding inflammatory changes. Spleen: Normal in size without focal abnormality. Adrenals/Urinary Tract: Adrenal glands are unremarkable. Kidneys are normal, without renal calculi, focal lesion, or hydronephrosis. Bladder is unremarkable. Stomach/Bowel: Stomach, small bowel, and colon are not abnormally distended. Stool throughout the colon. Previous right colon resection with ileocolonic anastomosis. Appendix is surgically absent. Vascular/Lymphatic: Aortic atherosclerosis. No enlarged abdominal or pelvic lymph nodes. Reproductive: Prostate is unremarkable. Other: Small amount of free fluid in the abdomen around the liver and extending along the pericolic gutters. This may be reactive. No free air. Abdominal wall musculature appears intact. Musculoskeletal: Degenerative changes in the spine. Curvilinear calcifications in the superior femoral heads bilaterally consistent with avascular necrosis. IMPRESSION: 1. Small bilateral pleural effusions with basilar atelectasis. 2. Mild periportal edema in the liver. This is nonspecific but could indicate infectious or inflammatory process such as hepatitis. 3. Aortic atherosclerosis. 4. 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 Electronically Signed   By: Elsie Gravely M.D.   On: 12/12/2023 17:21   CT Angio Chest Pulmonary Embolism (PE) W or WO Contrast Result Date: 12/12/2023 CLINICAL DATA:  Pulmonary embolus suspected with high probability. Hypertension, hyperlipidemia. Abnormal lab work. EXAM: CT ANGIOGRAPHY CHEST WITH CONTRAST TECHNIQUE: Multidetector CT imaging of the chest was performed using the standard protocol during bolus administration of intravenous contrast. Multiplanar CT image reconstructions and MIPs were  obtained to evaluate the vascular anatomy. RADIATION DOSE REDUCTION: This exam was performed according to the departmental dose-optimization program which includes automated exposure control, adjustment of the mA and/or kV according to patient size and/or use of iterative reconstruction technique. CONTRAST:  OMNIPAQUE  IOHEXOL  350 MG/ML  SOLN COMPARISON:  Chest radiograph 12/12/2023 FINDINGS: Cardiovascular: Technically adequate study with good opacification of the central and segmental pulmonary arteries. Mild motion artifact. No focal filling defects. No evidence of significant pulmonary embolus. Normal heart size. No pericardial effusions. Calcification of the aorta and coronary arteries. No aneurysm. Mediastinum/Nodes: No enlarged mediastinal, hilar, or axillary lymph nodes. Thyroid gland, trachea, and esophagus demonstrate no significant findings. Lungs/Pleura: Small bilateral pleural effusions. Minimal atelectasis in the lung bases. No airspace disease or consolidation is suggested. No pneumothorax. Upper Abdomen: Surgical absence of the gallbladder. Small amount of free fluid around the liver possibly ascites. Musculoskeletal: No chest wall abnormality. No acute or significant osseous findings. Review of the MIP images confirms the above findings. IMPRESSION: 1. No evidence of significant pulmonary embolus. 2. Small bilateral pleural effusions with minimal basilar atelectasis. 3. Small amount of free fluid noted around the liver suggesting ascites. Electronically Signed   By: Elsie Gravely M.D.   On: 12/12/2023 17:16   DG Chest 2 View Result Date: 12/12/2023 CLINICAL DATA:  dyspnea. EXAM: CHEST - 2 VIEW COMPARISON:  04/29/2019. FINDINGS: Low lung volume. Bilateral lung fields are clear. Bilateral costophrenic angles are clear. Stable cardio-mediastinal silhouette. No acute osseous abnormalities. The soft tissues are within normal limits. IMPRESSION: No active cardiopulmonary disease. Electronically  Signed   By: Ree Molt M.D.   On: 12/12/2023 11:32     No problem-specific Assessment & Plan notes found for this encounter.  # 78 year old male patient with no prior history of anemia-is currently admitted hospital for severe anemia.   # Severe anemia/mild leukopenia-lymphopenia /mild thrombocytopenia-hemoglobin 6-7 [baseline 13]-acute hemolysis-elevated LDH/DAT positive-elevated reticulocyte count.  Elevated bilirubin-indirect.  However no concern for microangiopathic process.  CT scan abdomen pelvis no evidence of any-malignancy noted.  CT chest again unremarkable.  # Plan:  # Given the significant drop in hemoglobin to 6.8-today I recommend IV Solu-Medrol  1 g daily for 3 days.  However this could be adjusted based on patient's response to transfusion/and full dose of Solu-Medrol .  If improved/stable consider switching to oral prednisone .  Based of response-rituximab could be considered.  Check hepatitis panel.  Check review of peripheral smear.  # Recommend folic acid -   # Proceed with PRBC transfusion as planned.  Discussed with blood bank.  # Recommend PPI-   # Also recommend Lovenox  given the high risk of DVT/PE events.  Thank you Dr. Awanda for allowing me to participate in the care of your pleasant patient. Please do not hesitate to contact me with questions or concerns in the interim.  Discussed with Dr. Awanda.  Above plan of care was discussed with patient/family in detail.  My contact information was given to the patient/family.  I think better to move the appointment out     Cindy JONELLE Joe, MD 12/13/2023 1:44 PM

## 2023-12-13 NOTE — Progress Notes (Addendum)
 1110 Blood consent signed and placed in chart. VSS in flowsheet 1PRBC started at this time. Nurse will remain in room with pt for first to ensure no transfusion reaction. Pt verbalized understanding   1456 Per MD repeat H&H can be drawn with morning labs 12/14/2023

## 2023-12-13 NOTE — Progress Notes (Signed)
  PROGRESS NOTE    Brian Montes  FMW:[id number] DOB: [birth year] DOA: 12/12/2023 PCP: [facility]  153A/153A-AA  LOS: 0 days   Brief hospital course:   Assessment & Plan: Brian Montes is a 78 y.o. male who is unable to report his medical history.  Per chart review I am able to glean that patient has history of cataracts, GERD, hyponatremia, depression, hyperlipidemia, NASH,    Patient presents to the ED at the urging of his primary care physician for abnormal labs.  Patient reports he has been feeling progressively weak and tired over the last month.  He also endorses some weight loss.   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  Reticulocyte count elevated.  Hematology consulted.  No concern for microangiopathic process.  CT scan abdomen pelvis no evidence of any-malignancy noted.  CT chest unremarkable.  --start IV solumedrol 1g daily for 3 days --1u pRBC today for Hgb 6.1   hyponatremia --Na intermittently low in the past --encourage oral hydration   HTN --BP currently wnl without antihypertensives   DVT prophylaxis: Lovenox  SQ Code Status: Full code  Family Communication:  Level of care: Med-Surg Dispo:   The patient is from: home Anticipated d/c is to: home Anticipated d/c date is: 1-2 days   Subjective and Interval History:  Pt reported feeling tired,  no other complaint.   Objective: Vitals:   12/13/23 1220 12/13/23 1334 12/13/23 1556 12/13/23 2005  BP: (!) 102/47 (!) 133/58 129/60 (!) 127/50  Pulse: (!) 59 70 67 70  Resp: 18 18 17 16   Temp: 98.2 F (36.8 C) 98 F (36.7 C) 98 F (36.7 C) 98.2 F (36.8 C)  TempSrc:  Oral Oral   SpO2: 96% 95% 100% 100%  Weight:      Height:        Intake/Output Summary (Last 24 hours) at 12/13/2023 2201 Last data filed at 12/13/2023 1601 Gross per 24 hour  Intake 635.82 ml  Output --  Net 635.82 ml   Filed Weights   12/12/23 0934  Weight:  86.6 kg    Examination:   Constitutional: NAD, AAOx3 HEENT: conjunctivae and lids normal, EOMI CV: No cyanosis.   RESP: normal respiratory effort, on RA Neuro: II - XII grossly intact.   Psych: Normal mood and affect.  Appropriate judgement and reason   Data Reviewed: I have personally reviewed labs and imaging studies  Time spent: 50 minutes  Brian Haber, MD [facility] If 7PM-7AM, please contact night-coverage 12/13/2023, 10:01 PM

## 2023-12-14 DIAGNOSIS — Z602 Problems related to living alone: Secondary | ICD-10-CM | POA: Diagnosis present

## 2023-12-14 DIAGNOSIS — E871 Hypo-osmolality and hyponatremia: Secondary | ICD-10-CM | POA: Diagnosis present

## 2023-12-14 DIAGNOSIS — D61818 Other pancytopenia: Secondary | ICD-10-CM | POA: Diagnosis present

## 2023-12-14 DIAGNOSIS — Z885 Allergy status to narcotic agent status: Secondary | ICD-10-CM | POA: Diagnosis not present

## 2023-12-14 DIAGNOSIS — Z7951 Long term (current) use of inhaled steroids: Secondary | ICD-10-CM | POA: Diagnosis not present

## 2023-12-14 DIAGNOSIS — R739 Hyperglycemia, unspecified: Secondary | ICD-10-CM | POA: Diagnosis not present

## 2023-12-14 DIAGNOSIS — I10 Essential (primary) hypertension: Secondary | ICD-10-CM | POA: Diagnosis present

## 2023-12-14 DIAGNOSIS — Z833 Family history of diabetes mellitus: Secondary | ICD-10-CM | POA: Diagnosis not present

## 2023-12-14 DIAGNOSIS — Z79899 Other long term (current) drug therapy: Secondary | ICD-10-CM | POA: Diagnosis not present

## 2023-12-14 DIAGNOSIS — K7581 Nonalcoholic steatohepatitis (NASH): Secondary | ICD-10-CM | POA: Diagnosis present

## 2023-12-14 DIAGNOSIS — D591 Autoimmune hemolytic anemia, unspecified: Secondary | ICD-10-CM | POA: Diagnosis present

## 2023-12-14 DIAGNOSIS — K219 Gastro-esophageal reflux disease without esophagitis: Secondary | ICD-10-CM | POA: Diagnosis present

## 2023-12-14 DIAGNOSIS — F17220 Nicotine dependence, chewing tobacco, uncomplicated: Secondary | ICD-10-CM | POA: Diagnosis present

## 2023-12-14 DIAGNOSIS — Z9049 Acquired absence of other specified parts of digestive tract: Secondary | ICD-10-CM | POA: Diagnosis not present

## 2023-12-14 DIAGNOSIS — Z7982 Long term (current) use of aspirin: Secondary | ICD-10-CM | POA: Diagnosis not present

## 2023-12-14 DIAGNOSIS — E785 Hyperlipidemia, unspecified: Secondary | ICD-10-CM | POA: Diagnosis present

## 2023-12-14 DIAGNOSIS — R0602 Shortness of breath: Secondary | ICD-10-CM | POA: Diagnosis present

## 2023-12-14 DIAGNOSIS — Z8249 Family history of ischemic heart disease and other diseases of the circulatory system: Secondary | ICD-10-CM | POA: Diagnosis not present

## 2023-12-14 LAB — COMPREHENSIVE METABOLIC PANEL WITH GFR
ALT: 46 U/L — ABNORMAL HIGH (ref 0–44)
AST: 48 U/L — ABNORMAL HIGH (ref 15–41)
Albumin: 3 g/dL — ABNORMAL LOW (ref 3.5–5.0)
Alkaline Phosphatase: 92 U/L (ref 38–126)
Anion gap: 6 (ref 5–15)
BUN: 17 mg/dL (ref 8–23)
CO2: 22 mmol/L (ref 22–32)
Calcium: 8.9 mg/dL (ref 8.9–10.3)
Chloride: 101 mmol/L (ref 98–111)
Creatinine, Ser: 1.04 mg/dL (ref 0.61–1.24)
GFR, Estimated: >60 mL/min (ref >60)
Glucose, Bld: 164 mg/dL — ABNORMAL HIGH (ref 70–99)
Potassium: 3.9 mmol/L (ref 3.5–5.1)
Sodium: 129 mmol/L — ABNORMAL LOW (ref 135–145)
Total Bilirubin: 2.9 mg/dL — ABNORMAL HIGH (ref 0.0–1.2)
Total Protein: 6.2 g/dL — ABNORMAL LOW (ref 6.5–8.1)

## 2023-12-14 LAB — CBC WITH DIFFERENTIAL/PLATELET
Abs Immature Granulocytes: 0.05 K/uL (ref 0.00–0.07)
Basophils Absolute: 0 K/uL (ref 0.0–0.1)
Basophils Relative: 0 %
Eosinophils Absolute: 0 K/uL (ref 0.0–0.5)
Eosinophils Relative: 0 %
HCT: 19.8 % — ABNORMAL LOW (ref 39.0–52.0)
Hemoglobin: 7.1 g/dL — ABNORMAL LOW (ref 13.0–17.0)
Immature Granulocytes: 1 %
Lymphocytes Relative: 7 %
Lymphs Abs: 0.3 K/uL — ABNORMAL LOW (ref 0.7–4.0)
MCH: 36.4 pg — ABNORMAL HIGH (ref 26.0–34.0)
MCHC: 35.9 g/dL (ref 30.0–36.0)
MCV: 101.5 fL — ABNORMAL HIGH (ref 80.0–100.0)
Monocytes Absolute: 0.2 K/uL (ref 0.1–1.0)
Monocytes Relative: 3 %
Neutro Abs: 4.3 K/uL (ref 1.7–7.7)
Neutrophils Relative %: 89 %
Platelets: 123 K/uL — ABNORMAL LOW (ref 150–400)
RBC: 1.95 MIL/uL — ABNORMAL LOW (ref 4.22–5.81)
RDW: 21 % — ABNORMAL HIGH (ref 11.5–15.5)
WBC: 4.9 K/uL (ref 4.0–10.5)
nRBC: 0 % (ref 0.0–0.2)

## 2023-12-14 LAB — HAPTOGLOBIN: Haptoglobin: <10 mg/dL — ABNORMAL LOW (ref 34–355)

## 2023-12-14 LAB — HEPATITIS C ANTIBODY: HCV Ab: NONREACTIVE

## 2023-12-14 LAB — LACTATE DEHYDROGENASE: LDH: 305 U/L — ABNORMAL HIGH (ref 98–192)

## 2023-12-14 LAB — MAGNESIUM: Magnesium: 1.8 mg/dL (ref 1.7–2.4)

## 2023-12-14 LAB — HEPATITIS B SURFACE ANTIGEN: Hepatitis B Surface Ag: NONREACTIVE

## 2023-12-14 LAB — T4: T4, Total: 10.6 ug/dL (ref 4.5–12.0)

## 2023-12-14 MED ORDER — TRAZODONE HCL 100 MG PO TABS
100.0000 mg | ORAL_TABLET | Freq: Every day | ORAL | Status: DC
  Administered 2023-12-14: 100 mg via ORAL
  Filled 2023-12-14: qty 1

## 2023-12-14 MED ORDER — AMITRIPTYLINE HCL 10 MG PO TABS
10.0000 mg | ORAL_TABLET | Freq: Every day | ORAL | Status: DC
  Administered 2023-12-14: 10 mg via ORAL
  Filled 2023-12-14: qty 1

## 2023-12-14 MED ORDER — ASPIRIN 81 MG PO TBEC
81.0000 mg | DELAYED_RELEASE_TABLET | Freq: Every day | ORAL | Status: DC
  Administered 2023-12-14 – 2023-12-15 (×2): 81 mg via ORAL
  Filled 2023-12-14 (×2): qty 1

## 2023-12-14 MED ORDER — FLUOXETINE HCL 20 MG PO CAPS
20.0000 mg | ORAL_CAPSULE | Freq: Every day | ORAL | Status: DC
  Administered 2023-12-14 – 2023-12-15 (×2): 20 mg via ORAL
  Filled 2023-12-14 (×2): qty 1

## 2023-12-14 NOTE — Progress Notes (Signed)
 Brian Montes   DOB:[birth year]   FM#:[id number]    Subjective: Patient sitting in the chair.  No acute distress.  Blood transfusion was uneventful yesterday.  Mild shortness of breath on exertion.  Objective:  Vitals:   12/14/23 1659 12/14/23 1934  BP: (!) 130/56 (!) 131/54  Pulse: 81 72  Resp: 16 20  Temp: 98.5 F (36.9 C) 98.6 F (37 C)  SpO2: 100% 100%     Intake/Output Summary (Last 24 hours) at 12/14/2023 2123 Last data filed at 12/14/2023 9078 Gross per 24 hour  Intake 240 ml  Output --  Net 240 ml    Physical Exam Vitals and nursing note reviewed.  Constitutional:      Comments:     HENT:     Head: Normocephalic and atraumatic.     Mouth/Throat:     Mouth: Mucous membranes are moist.     Pharynx: No oropharyngeal exudate.  Eyes:     Pupils: Pupils are equal, round, and reactive to light.  Cardiovascular:     Rate and Rhythm: Normal rate and regular rhythm.  Pulmonary:     Effort: No respiratory distress.     Breath sounds: No wheezing.     Comments: Decreased breath sounds bilaterally at bases.  No wheeze or crackles Abdominal:     General: Bowel sounds are normal. There is no distension.     Palpations: Abdomen is soft. There is no mass.     Tenderness: There is no abdominal tenderness. There is no guarding or rebound.  Musculoskeletal:        General: No tenderness. Normal range of motion.     Cervical back: Normal range of motion and neck supple.  Skin:    General: Skin is warm.  Neurological:     Mental Status: He is alert and oriented to person, place, and time.  Psychiatric:        Mood and Affect: Affect normal.        Judgment: Judgment normal.      Labs:  Lab Results  Component Value Date   WBC 4.9 12/14/2023   HGB 7.1 (L) 12/14/2023   HCT 19.8 (L) 12/14/2023   MCV 101.5 (H) 12/14/2023   PLT 123 (L) 12/14/2023   NEUTROABS 4.3 12/14/2023    Lab Results  Component Value Date   NA 129 (L) 12/14/2023   K 3.9 12/14/2023   CL 101  12/14/2023   CO2 22 12/14/2023    Studies:  No results found.   No problem-specific Assessment & Plan notes found for this encounter.   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  Tolerating well.  Hemoglobin stable around 7 posttransfusion/hemolytic parameters stable/improving. hold off blood transfusion today.  # If hemoglobin continues to be stable/improved-consider switching to prednisone  at discharge.  Patient may need rituximab outpatient . Continue folic acid -   # Recommend PPI-    # Continue Lovenox  given the high risk of DVT/PE events.   Cindy JONELLE Joe, MD 12/14/2023  9:23 PM

## 2023-12-14 NOTE — Progress Notes (Signed)
  PROGRESS NOTE    CAILEN Montes  FMW:[id number] DOB: [birth year] DOA: 12/12/2023 PCP: [facility]  153A/153A-AA  LOS: 0 days   Brief hospital course:   Assessment & Plan: Brian Montes is a 78 y.o. male who is unable to report his medical history.  Per chart review I am able to glean that patient has history of cataracts, GERD, hyponatremia, depression, hyperlipidemia, NASH,    Patient presents to the ED at the urging of his primary care physician for abnormal labs.  Patient reports he has been feeling progressively weak and tired over the last month.  He also endorses some weight loss.   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  Reticulocyte count elevated.  Hematology consulted.  No concern for microangiopathic process.  CT scan abdomen pelvis no evidence of any-malignancy noted.  CT chest unremarkable.  --s/p 1u pRBC for Hgb 6.1 --cont IV solumedrol for 3 days   hyponatremia --Na intermittently low in the past --encourage oral hydration   HTN --BP currently wnl without antihypertensives --monitor  Hyperglycemia --obtain A1c   DVT prophylaxis: Lovenox  SQ Code Status: Full code  Family Communication:  Level of care: Med-Surg Dispo:   The patient is from: home Anticipated d/c is to: home Anticipated d/c date is: 1-2 days   Subjective and Interval History:  No new complaint today.   Objective: Vitals:   12/13/23 2005 12/14/23 0423 12/14/23 0831 12/14/23 1659  BP: (!) 127/50 110/61 (!) 131/58 (!) 130/56  Pulse: 70 61 (!) 59 81  Resp: 16 16 16 16   Temp: 98.2 F (36.8 C) (!) 97.5 F (36.4 C) 98.6 F (37 C) 98.5 F (36.9 C)  TempSrc:  Oral    SpO2: 100% 100% 100% 100%  Weight:      Height:        Intake/Output Summary (Last 24 hours) at 12/14/2023 1751 Last data filed at 12/14/2023 9078 Gross per 24 hour  Intake 240 ml  Output --  Net 240 ml   Filed Weights   12/12/23 0934   Weight: 86.6 kg    Examination:   Constitutional: NAD, AAOx3 HEENT: conjunctivae and lids normal, EOMI CV: No cyanosis.   RESP: normal respiratory effort, on RA Neuro: II - XII grossly intact.   Psych: Normal mood and affect.  Appropriate judgement and reason   Data Reviewed: I have personally reviewed labs and imaging studies  Time spent: 50 minutes  Ellouise Haber, MD [facility] If 7PM-7AM, please contact night-coverage 12/14/2023, 5:51 PM

## 2023-12-15 ENCOUNTER — Telehealth: Payer: Self-pay | Admitting: Internal Medicine

## 2023-12-15 ENCOUNTER — Other Ambulatory Visit: Payer: Self-pay

## 2023-12-15 DIAGNOSIS — D61818 Other pancytopenia: Secondary | ICD-10-CM | POA: Diagnosis not present

## 2023-12-15 LAB — CBC WITH DIFFERENTIAL/PLATELET
Abs Immature Granulocytes: 0.06 K/uL (ref 0.00–0.07)
Basophils Absolute: 0 K/uL (ref 0.0–0.1)
Basophils Relative: 0 %
Eosinophils Absolute: 0 K/uL (ref 0.0–0.5)
Eosinophils Relative: 0 %
HCT: 20.9 % — ABNORMAL LOW (ref 39.0–52.0)
Hemoglobin: 7.2 g/dL — ABNORMAL LOW (ref 13.0–17.0)
Immature Granulocytes: 1 %
Lymphocytes Relative: 6 %
Lymphs Abs: 0.3 K/uL — ABNORMAL LOW (ref 0.7–4.0)
MCH: 35.3 pg — ABNORMAL HIGH (ref 26.0–34.0)
MCHC: 34.4 g/dL (ref 30.0–36.0)
MCV: 102.5 fL — ABNORMAL HIGH (ref 80.0–100.0)
Monocytes Absolute: 0.2 K/uL (ref 0.1–1.0)
Monocytes Relative: 4 %
Neutro Abs: 4.3 K/uL (ref 1.7–7.7)
Neutrophils Relative %: 89 %
Platelets: 129 K/uL — ABNORMAL LOW (ref 150–400)
RBC: 2.04 MIL/uL — ABNORMAL LOW (ref 4.22–5.81)
RDW: 21.3 % — ABNORMAL HIGH (ref 11.5–15.5)
Smear Review: NORMAL
WBC: 4.9 K/uL (ref 4.0–10.5)
nRBC: 0 % (ref 0.0–0.2)

## 2023-12-15 LAB — COMPREHENSIVE METABOLIC PANEL WITH GFR
ALT: 41 U/L (ref 0–44)
AST: 41 U/L (ref 15–41)
Albumin: 2.9 g/dL — ABNORMAL LOW (ref 3.5–5.0)
Alkaline Phosphatase: 82 U/L (ref 38–126)
Anion gap: 5 (ref 5–15)
BUN: 21 mg/dL (ref 8–23)
CO2: 25 mmol/L (ref 22–32)
Calcium: 9.5 mg/dL (ref 8.9–10.3)
Chloride: 103 mmol/L (ref 98–111)
Creatinine, Ser: 0.86 mg/dL (ref 0.61–1.24)
GFR, Estimated: >60 mL/min (ref >60)
Glucose, Bld: 145 mg/dL — ABNORMAL HIGH (ref 70–99)
Potassium: 4.6 mmol/L (ref 3.5–5.1)
Sodium: 133 mmol/L — ABNORMAL LOW (ref 135–145)
Total Bilirubin: 2.7 mg/dL — ABNORMAL HIGH (ref 0.0–1.2)
Total Protein: 5.8 g/dL — ABNORMAL LOW (ref 6.5–8.1)

## 2023-12-15 LAB — LACTATE DEHYDROGENASE: LDH: 271 U/L — ABNORMAL HIGH (ref 98–192)

## 2023-12-15 LAB — HEPATITIS B CORE ANTIBODY, TOTAL: HEP B CORE AB: NEGATIVE

## 2023-12-15 MED ORDER — FOLIC ACID 1 MG PO TABS
1.0000 mg | ORAL_TABLET | Freq: Every day | ORAL | 2 refills | Status: DC
  Filled 2023-12-15: qty 30, 30d supply, fill #0

## 2023-12-15 MED ORDER — PREDNISONE 20 MG PO TABS
80.0000 mg | ORAL_TABLET | Freq: Every day | ORAL | 0 refills | Status: DC
  Filled 2023-12-15: qty 56, 14d supply, fill #0

## 2023-12-15 NOTE — Telephone Encounter (Signed)
 Labs entered.

## 2023-12-15 NOTE — Telephone Encounter (Signed)
 Follow up SEP 5th- MD:15 mins- labs- cbc/cmp;HOLD tube; D-3 possible 1 unit. Call son to schedule appt  Thank you-  GB

## 2023-12-15 NOTE — Progress Notes (Addendum)
 1116 D/C AVS completed and reviewed with pt. All opportunities for questions answered and clarified. IV removed.  D/c barriers Meds to bed and MD to round at 12pm per DR Awanda  1234 Attempted to call son for d/c. No answer left VM

## 2023-12-15 NOTE — Telephone Encounter (Signed)
 Received inbasket to call and r/s lab, MD, and poss infusion. Got the okay from nurse to use the 9:15 time slot. Called pt son and confirmed appt date and times. - LH

## 2023-12-15 NOTE — Progress Notes (Signed)
 Brian Montes   DOB:09-Apr-1946   FM#:[id number]    Subjective: Patient sitting in the chair.  No acute distress.  Blood transfusion was uneventful yesterday.  Mild shortness of breath on exertion.  Objective:  Vitals:   12/14/23 1934 12/15/23 0356  BP: (!) 131/54 (!) 133/53  Pulse: 72 63  Resp: 20 20  Temp: 98.6 F (37 C) 97.6 F (36.4 C)  SpO2: 100% 100%     Intake/Output Summary (Last 24 hours) at 12/15/2023 0849 Last data filed at 12/14/2023 9078 Gross per 24 hour  Intake 240 ml  Output --  Net 240 ml    Physical Exam Vitals and nursing note reviewed.  Constitutional:      Comments:     HENT:     Head: Normocephalic and atraumatic.     Mouth/Throat:     Mouth: Mucous membranes are moist.     Pharynx: No oropharyngeal exudate.  Eyes:     Pupils: Pupils are equal, round, and reactive to light.  Cardiovascular:     Rate and Rhythm: Normal rate and regular rhythm.  Pulmonary:     Effort: No respiratory distress.     Breath sounds: No wheezing.     Comments: Decreased breath sounds bilaterally at bases.  No wheeze or crackles Abdominal:     General: Bowel sounds are normal. There is no distension.     Palpations: Abdomen is soft. There is no mass.     Tenderness: There is no abdominal tenderness. There is no guarding or rebound.  Musculoskeletal:        General: No tenderness. Normal range of motion.     Cervical back: Normal range of motion and neck supple.  Skin:    General: Skin is warm.  Neurological:     Mental Status: He is alert and oriented to person, place, and time.  Psychiatric:        Mood and Affect: Affect normal.        Judgment: Judgment normal.      Labs:  Lab Results  Component Value Date   WBC 4.9 12/15/2023   HGB 7.2 (L) 12/15/2023   HCT 20.9 (L) 12/15/2023   MCV 102.5 (H) 12/15/2023   PLT 129 (L) 12/15/2023   NEUTROABS 4.3 12/15/2023    Lab Results  Component Value Date   NA 133 (L) 12/15/2023   K 4.6 12/15/2023   CL 103  12/15/2023   CO2 25 12/15/2023    Studies:  No results found.   No problem-specific Assessment & Plan notes found for this encounter.   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  Tolerating well.  Hemoglobin stable around 7 posttransfusion/hemolytic parameters stable/improving. hold off blood transfusion today.   # recommend prednisone  80 mg/day x 2 weeks- at discharge.  Patient may need rituximab outpatient. Continue folic acid -   # Recommend PPI-    # Continue Lovenox  given the high risk of DVT/PE events.  reached out to patient's son Curtistine re: the above plan- will plan follow up next week in clinic. He is in agreement. Discussed with Dr.Lai  Follow up SEP 5th- MD:15 mins- labs- cbc/cmp;HOLD tube; D-3 possible 1 unit. Call son to schedule appt   Cindy JONELLE Joe, MD 12/15/2023  8:49 AM

## 2023-12-15 NOTE — Plan of Care (Signed)

## 2023-12-15 NOTE — Discharge Summary (Signed)
 Physician Discharge Summary   Brian Montes  male DOB: [birth year]  FMW:[id number]  PCP: [facility]  Admit date: 12/12/2023 Discharge date: 12/15/2023  Admitted From: home Disposition:  home CODE STATUS: Full code   Hospital Course:  For full details, please see H&P, progress notes, consult notes and ancillary notes.  Briefly,  Brian Montes is a 78 y.o. male with hx of hyponatremia, NASH, who presented to the ED at the urging of his primary care physician for abnormal labs.    Patient reported he has been feeling progressively weak and tired over the last month.  He also endorsed some weight loss.   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  Reticulocyte count elevated.  Hematology consulted.  No concern for microangiopathic process.  CT scan abdomen pelvis no evidence of any-malignancy noted.  CT chest unremarkable.  --s/p 1u pRBC for Hgb 6.1 --received IV solumedrol for 3 days and discharged on prednisone  80 mg daily, to be taken until outpatient hematology f/u.   hyponatremia --Na intermittently low in the past, in low 130's.   HTN --BP currently wnl without antihypertensives --d/c'ed home hydrochlorothiazide, Lisinopril  and Toprol .   Hyperglycemia --A1c 4.6, not diabetic.   Discharge Diagnoses:  Principal Problem:   Pancytopenia (HCC)   30 Day Unplanned Readmission Risk Score    Flowsheet Row ED to Hosp-Admission (Current) from 12/12/2023 in [facility] (1A)  30 Day Unplanned Readmission Risk Score (%) 12.23 Filed at 12/15/2023 0801    This score is the patient's risk of an unplanned readmission within 30 days of being discharged (0 -100%). The score is based on dignosis, age, lab data, medications, orders, and past utilization.   Low:  0-14.9   Medium: 15-21.9   High: 22-29.9   Extreme: 30 and above         Discharge Instructions:  Allergies as  of 12/15/2023       Reactions   Codeine Other (See Comments)        Medication List     STOP taking these medications    hydrochlorothiazide 25 MG tablet Commonly known as: HYDRODIURIL   lisinopril  40 MG tablet Commonly known as: ZESTRIL    metoprolol  succinate 100 MG 24 hr tablet Commonly known as: TOPROL -XL       TAKE these medications    amitriptyline  10 MG tablet Commonly known as: ELAVIL  Take 10 mg by mouth at bedtime.   aspirin  EC 81 MG tablet Take 81 mg by mouth daily.   atorvastatin  40 MG tablet Commonly known as: LIPITOR Take 40 mg by mouth at bedtime.   Calcium  Carb-Cholecalciferol 600-10 MG-MCG Tabs Take 1 tablet by mouth 2 (two) times daily.   FLUoxetine  20 MG capsule Commonly known as: PROZAC  Take 20 mg by mouth daily.   folic acid  1 MG tablet Commonly known as: FOLVITE  Take 1 tablet (1 mg total) by mouth daily. Start taking on: December 16, 2023   Iron 325 (65 Fe) MG Tabs Take 1 tablet by mouth 2 (two) times daily.   loratadine 10 MG tablet Commonly known as: CLARITIN Take 10 mg by mouth daily.   omeprazole 20 MG capsule Commonly known as: PRILOSEC Take 20 mg by mouth daily.   predniSONE  20 MG tablet Commonly known as: DELTASONE  Take 4 tablets (80 mg total) by mouth daily with breakfast for 14 days. Start taking on: December 16, 2023   traZODone  100 MG  tablet Commonly known as: DESYREL  Take 100 mg by mouth at bedtime.         Follow-up Information     [name], MD Follow up.   Specialties: Internal Medicine, Oncology Contact information: [address] [place] [address] [phone]                 Allergies  Allergen Reactions   Codeine Other (See Comments)     The results of significant diagnostics from this hospitalization (including imaging, microbiology, ancillary and laboratory) are listed below for reference.   Consultations:   Procedures/Studies: CT ABDOMEN PELVIS W  CONTRAST Result Date: 12/12/2023 CLINICAL DATA:  Evaluation of metastatic disease. History of hypertension, hyperlipidemia, and abnormal labs. EXAM: CT ABDOMEN AND PELVIS WITH CONTRAST TECHNIQUE: Multidetector CT imaging of the abdomen and pelvis was performed using the standard protocol following bolus administration of intravenous contrast. RADIATION DOSE REDUCTION: This exam was performed according to the departmental dose-optimization program which includes automated exposure control, adjustment of the mA and/or kV according to patient size and/or use of iterative reconstruction technique. CONTRAST:  OMNIPAQUE  IOHEXOL  350 MG/ML SOLN COMPARISON:  02/21/2023 FINDINGS: Lower chest: Small bilateral pleural effusions with basilar atelectasis. Hepatobiliary: No focal liver lesions. Mild periportal edema may indicate infectious or inflammatory process such as hepatitis. Surgical absence of the gallbladder. No bile duct dilatation. Pancreas: Unremarkable. No pancreatic ductal dilatation or surrounding inflammatory changes. Spleen: Normal in size without focal abnormality. Adrenals/Urinary Tract: Adrenal glands are unremarkable. Kidneys are normal, without renal calculi, focal lesion, or hydronephrosis. Bladder is unremarkable. Stomach/Bowel: Stomach, small bowel, and colon are not abnormally distended. Stool throughout the colon. Previous right colon resection with ileocolonic anastomosis. Appendix is surgically absent. Vascular/Lymphatic: Aortic atherosclerosis. No enlarged abdominal or pelvic lymph nodes. Reproductive: Prostate is unremarkable. Other: Small amount of free fluid in the abdomen around the liver and extending along the pericolic gutters. This may be reactive. No free air. Abdominal wall musculature appears intact. Musculoskeletal: Degenerative changes in the spine. Curvilinear calcifications in the superior femoral heads bilaterally consistent with avascular necrosis. IMPRESSION: 1. Small  bilateral pleural effusions with basilar atelectasis. 2. Mild periportal edema in the liver. This is nonspecific but could indicate infectious or inflammatory process such as hepatitis. 3. Aortic atherosclerosis. 4. 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 Electronically Signed   By: Elsie Gravely M.D.   On: 12/12/2023 17:21   CT Angio Chest Pulmonary Embolism (PE) W or WO Contrast Result Date: 12/12/2023 CLINICAL DATA:  Pulmonary embolus suspected with high probability. Hypertension, hyperlipidemia. Abnormal lab work. EXAM: CT ANGIOGRAPHY CHEST WITH CONTRAST TECHNIQUE: Multidetector CT imaging of the chest was performed using the standard protocol during bolus administration of intravenous contrast. Multiplanar CT image reconstructions and MIPs were obtained to evaluate the vascular anatomy. RADIATION DOSE REDUCTION: This exam was performed according to the departmental dose-optimization program which includes automated exposure control, adjustment of the mA and/or kV according to patient size and/or use of iterative reconstruction technique. CONTRAST:  OMNIPAQUE  IOHEXOL  350 MG/ML SOLN COMPARISON:  Chest radiograph 12/12/2023 FINDINGS: Cardiovascular: Technically adequate study with good opacification of the central and segmental pulmonary arteries. Mild motion artifact. No focal filling defects. No evidence of significant pulmonary embolus. Normal heart size. No pericardial effusions. Calcification of the aorta and coronary arteries. No aneurysm. Mediastinum/Nodes: No enlarged mediastinal, hilar, or axillary lymph nodes. Thyroid gland, trachea, and esophagus demonstrate no significant findings. Lungs/Pleura: Small bilateral pleural effusions. Minimal atelectasis in the lung bases.  No airspace disease or consolidation is suggested. No pneumothorax. Upper Abdomen: Surgical absence of the gallbladder. Small amount of free fluid around the liver possibly ascites. Musculoskeletal: No chest wall  abnormality. No acute or significant osseous findings. Review of the MIP images confirms the above findings. IMPRESSION: 1. No evidence of significant pulmonary embolus. 2. Small bilateral pleural effusions with minimal basilar atelectasis. 3. Small amount of free fluid noted around the liver suggesting ascites. Electronically Signed   By: Elsie Gravely M.D.   On: 12/12/2023 17:16   DG Chest 2 View Result Date: 12/12/2023 CLINICAL DATA:  dyspnea. EXAM: CHEST - 2 VIEW COMPARISON:  04/29/2019. FINDINGS: Low lung volume. Bilateral lung fields are clear. Bilateral costophrenic angles are clear. Stable cardio-mediastinal silhouette. No acute osseous abnormalities. The soft tissues are within normal limits. IMPRESSION: No active cardiopulmonary disease. Electronically Signed   By: Ree Molt M.D.   On: 12/12/2023 11:32      Labs: BNP (last 3 results) No results for input(s): BNP in the last 8760 hours. Basic Metabolic Panel: Recent Labs  Lab 12/12/23 0937 12/13/23 0635 12/14/23 0416 12/15/23 0329  NA 131* 131* 129* 133*  K 3.8 4.2 3.9 4.6  CL 99 100 101 103  CO2 26 23 22 25   GLUCOSE 105* 146* 164* 145*  BUN 13 15 17 21   CREATININE 0.99 0.90 1.04 0.86  CALCIUM  9.2 8.9 8.9 9.5  MG  --  1.8 1.8  --   PHOS  --  2.6  --   --    Liver Function Tests: Recent Labs  Lab 12/12/23 0937 12/12/23 1656 12/13/23 0635 12/14/23 0416 12/15/23 0329  AST 85*  --  69* 48* 41  ALT 70*  --  60* 46* 41  ALKPHOS 118  --  113 92 82  BILITOT 4.2* 4.1* 3.7* 2.9* 2.7*  PROT 6.7  --  6.1* 6.2* 5.8*  ALBUMIN 3.2*  --  2.8* 3.0* 2.9*   No results for input(s): LIPASE, AMYLASE in the last 168 hours. Recent Labs  Lab 12/12/23 1243  AMMONIA 25   CBC: Recent Labs  Lab 12/12/23 0937 12/13/23 0635 12/14/23 0416 12/15/23 0329  WBC 3.6* 2.3* 4.9 4.9  NEUTROABS 1.9 1.7 4.3 4.3  HGB 7.2* 6.1* 7.1* 7.2*  HCT 20.3* 17.7* 19.8* 20.9*  MCV 104.1* 106.6* 101.5* 102.5*  PLT 112* 117* 123* 129*    Cardiac Enzymes: No results for input(s): CKTOTAL, CKMB, CKMBINDEX, TROPONINI in the last 168 hours. BNP: Invalid input(s): POCBNP CBG: No results for input(s): GLUCAP in the last 168 hours. D-Dimer No results for input(s): DDIMER in the last 72 hours. Hgb A1c No results for input(s): HGBA1C in the last 72 hours. Lipid Profile No results for input(s): CHOL, HDL, LDLCALC, TRIG, CHOLHDL, LDLDIRECT in the last 72 hours. Thyroid function studies Recent Labs    12/12/23 1236 12/12/23 1656  TSH 5.453*  --   T4TOTAL  --  10.6   Anemia work up Recent Labs    12/12/23 1236 12/12/23 1243  VITAMINB12  --  344  FOLATE 6.9  --   FERRITIN 221  --   TIBC 382  --   IRON 238*  --   RETICCTPCT  --  7.6*   Urinalysis No results found for: COLORURINE, APPEARANCEUR, LABSPEC, PHURINE, GLUCOSEU, HGBUR, BILIRUBINUR, KETONESUR, PROTEINUR, UROBILINOGEN, NITRITE, LEUKOCYTESUR Sepsis Labs Recent Labs  Lab 12/12/23 [phone] 12/13/23 0635 12/14/23 0416 12/15/23 0329  WBC 3.6* 2.3* 4.9 4.9   Microbiology No results found for  this or any previous visit (from the past 240 hours).   Total time spend on discharging this patient, including the last patient exam, discussing the hospital stay, instructions for ongoing care as it relates to all pertinent caregivers, as well as preparing the medical discharge records, prescriptions, and/or referrals as applicable, is 30 minutes.    Ellouise Haber, MD  [facility] 12/15/2023, 10:53 AM

## 2023-12-17 LAB — HEMOGLOBIN A1C
Hgb A1c MFr Bld: 4.6 % — ABNORMAL LOW (ref 4.8–5.6)
Mean Plasma Glucose: 85 mg/dL

## 2023-12-18 LAB — TYPE AND SCREEN
ABO/RH(D): A POS
Antibody Screen: POSITIVE
DAT, IgG: POSITIVE
DAT, complement: NEGATIVE
Unit division: 0
Unit division: 0
Unit division: 0

## 2023-12-18 LAB — BPAM RBC
Blood Product Expiration Date: 202509182359
Blood Product Expiration Date: 202509242359
Blood Product Expiration Date: 202509242359
ISSUE DATE / TIME: 202508271107
Unit Type and Rh: 5100
Unit Type and Rh: 6200
Unit Type and Rh: 6200

## 2023-12-21 ENCOUNTER — Telehealth: Payer: Self-pay | Admitting: *Deleted

## 2023-12-21 LAB — ABO/RH: ABO/RH(D): A POS

## 2023-12-21 NOTE — Telephone Encounter (Signed)
 Telephone call to patient. I spoke with his son, Curtistine to tell him there was an error made in scheduling. He does have an 8:45 am lab appt and MD appt at 9:15 am. He was also scheduled for blood transfusion at 12:15 tomorrow. I explained Blood transfusions are done with labs on day 1 and blood on day 2. I offered a lab only appt today or we can schedule the Blood transfusion for Mon 9/8 at 12:15pm.  Son chose Blood Transfusion for Monday. I apologized for any confusion and inconvenience.

## 2023-12-22 ENCOUNTER — Encounter: Payer: Self-pay | Admitting: Internal Medicine

## 2023-12-22 ENCOUNTER — Inpatient Hospital Stay

## 2023-12-22 ENCOUNTER — Inpatient Hospital Stay (HOSPITAL_BASED_OUTPATIENT_CLINIC_OR_DEPARTMENT_OTHER): Admitting: Internal Medicine

## 2023-12-22 ENCOUNTER — Other Ambulatory Visit: Payer: Self-pay

## 2023-12-22 ENCOUNTER — Inpatient Hospital Stay: Attending: Internal Medicine

## 2023-12-22 VITALS — BP 121/67 | HR 99 | Temp 99.0°F | Resp 19 | Ht 69.0 in | Wt 189.0 lb

## 2023-12-22 DIAGNOSIS — D5911 Warm autoimmune hemolytic anemia: Secondary | ICD-10-CM | POA: Diagnosis present

## 2023-12-22 DIAGNOSIS — R410 Disorientation, unspecified: Secondary | ICD-10-CM | POA: Insufficient documentation

## 2023-12-22 DIAGNOSIS — Z5112 Encounter for antineoplastic immunotherapy: Secondary | ICD-10-CM | POA: Insufficient documentation

## 2023-12-22 DIAGNOSIS — Z8052 Family history of malignant neoplasm of bladder: Secondary | ICD-10-CM | POA: Diagnosis not present

## 2023-12-22 DIAGNOSIS — M549 Dorsalgia, unspecified: Secondary | ICD-10-CM | POA: Insufficient documentation

## 2023-12-22 DIAGNOSIS — Z7952 Long term (current) use of systemic steroids: Secondary | ICD-10-CM | POA: Insufficient documentation

## 2023-12-22 DIAGNOSIS — R5383 Other fatigue: Secondary | ICD-10-CM | POA: Diagnosis not present

## 2023-12-22 DIAGNOSIS — D649 Anemia, unspecified: Secondary | ICD-10-CM

## 2023-12-22 DIAGNOSIS — G8929 Other chronic pain: Secondary | ICD-10-CM | POA: Insufficient documentation

## 2023-12-22 DIAGNOSIS — D589 Hereditary hemolytic anemia, unspecified: Secondary | ICD-10-CM | POA: Insufficient documentation

## 2023-12-22 DIAGNOSIS — F17220 Nicotine dependence, chewing tobacco, uncomplicated: Secondary | ICD-10-CM | POA: Diagnosis not present

## 2023-12-22 DIAGNOSIS — D61818 Other pancytopenia: Secondary | ICD-10-CM

## 2023-12-22 LAB — CMP (CANCER CENTER ONLY)
ALT: 52 U/L — ABNORMAL HIGH (ref 0–44)
AST: 37 U/L (ref 15–41)
Albumin: 3.6 g/dL (ref 3.5–5.0)
Alkaline Phosphatase: 95 U/L (ref 38–126)
Anion gap: 10 (ref 5–15)
BUN: 27 mg/dL — ABNORMAL HIGH (ref 8–23)
CO2: 22 mmol/L (ref 22–32)
Calcium: 9.5 mg/dL (ref 8.9–10.3)
Chloride: 103 mmol/L (ref 98–111)
Creatinine: 1.26 mg/dL — ABNORMAL HIGH (ref 0.61–1.24)
GFR, Estimated: 59 mL/min — ABNORMAL LOW (ref >60)
Glucose, Bld: 115 mg/dL — ABNORMAL HIGH (ref 70–99)
Potassium: 3.4 mmol/L — ABNORMAL LOW (ref 3.5–5.1)
Sodium: 135 mmol/L (ref 135–145)
Total Bilirubin: 4.2 mg/dL (ref 0.0–1.2)
Total Protein: 6.3 g/dL — ABNORMAL LOW (ref 6.5–8.1)

## 2023-12-22 LAB — CBC WITH DIFFERENTIAL (CANCER CENTER ONLY)
Abs Immature Granulocytes: 0.09 K/uL — ABNORMAL HIGH (ref 0.00–0.07)
Basophils Absolute: 0 K/uL (ref 0.0–0.1)
Basophils Relative: 0 %
Eosinophils Absolute: 0.1 K/uL (ref 0.0–0.5)
Eosinophils Relative: 1 %
HCT: 27.2 % — ABNORMAL LOW (ref 39.0–52.0)
Hemoglobin: 9.5 g/dL — ABNORMAL LOW (ref 13.0–17.0)
Immature Granulocytes: 2 %
Lymphocytes Relative: 9 %
Lymphs Abs: 0.5 K/uL — ABNORMAL LOW (ref 0.7–4.0)
MCH: 36.8 pg — ABNORMAL HIGH (ref 26.0–34.0)
MCHC: 34.9 g/dL (ref 30.0–36.0)
MCV: 105.4 fL — ABNORMAL HIGH (ref 80.0–100.0)
Monocytes Absolute: 0.3 K/uL (ref 0.1–1.0)
Monocytes Relative: 5 %
Neutro Abs: 4.1 K/uL (ref 1.7–7.7)
Neutrophils Relative %: 83 %
Platelet Count: 132 K/uL — ABNORMAL LOW (ref 150–400)
RBC: 2.58 MIL/uL — ABNORMAL LOW (ref 4.22–5.81)
RDW: 18.2 % — ABNORMAL HIGH (ref 11.5–15.5)
WBC Count: 5 K/uL (ref 4.0–10.5)
nRBC: 0 % (ref 0.0–0.2)

## 2023-12-22 LAB — SAMPLE TO BLOOD BANK

## 2023-12-22 MED ORDER — PREDNISONE 20 MG PO TABS: 60.0000 mg | ORAL_TABLET | Freq: Every day | ORAL | 0 refills | Status: DC

## 2023-12-22 MED ORDER — FOLIC ACID 1 MG PO TABS: 1.0000 mg | ORAL_TABLET | Freq: Every day | ORAL | 1 refills | Status: AC

## 2023-12-22 NOTE — Progress Notes (Signed)
  Cancer Center CONSULT NOTE  Patient Care Team: Center, [facility] as PCP - General (General Practice) [name], MD as Consulting Physician (Oncology)  CHIEF COMPLAINTS/PURPOSE OF CONSULTATION: Hemolytic anemia  Oncology History   No history exists.    HISTORY OF PRESENTING ILLNESS: Patient ambulating- in wheel chair. Accompanied by son  Brian Montes 78 y.o.  male pleasant patient with   with hx of hyponatremia, NASH-recently admitted to the hospital for acute onset of autoimmune hemolytic anemia.  On presentation to the hospital patient reported he has been feeling progressively weak and tired over the last month.  He also endorsed some weight loss.   --s/p 1u pRBC for Hgb 6.1-patient then --received IV solumedrol for 3 days and discharged on prednisone  80 mg daily.  CT scan abdomen pelvis no evidence of any-malignancy noted.  CT chest unremarkable  Today patient feels confused intermittently.  Chronic back pain not any worse.  Denies any chest pain.  Appetite fair.  Review of Systems  Constitutional:  Positive for malaise/fatigue. Negative for chills, diaphoresis, fever and weight loss.  HENT:  Negative for nosebleeds and sore throat.   Eyes:  Negative for double vision.  Respiratory:  Negative for cough, hemoptysis, sputum production, shortness of breath and wheezing.   Cardiovascular:  Negative for chest pain, palpitations, orthopnea and leg swelling.  Gastrointestinal:  Negative for abdominal pain, blood in stool, constipation, diarrhea, heartburn, melena, nausea and vomiting.  Genitourinary:  Negative for dysuria, frequency and urgency.  Musculoskeletal:  Positive for back pain and joint pain.  Skin: Negative.  Negative for itching and rash.  Neurological:  Negative for dizziness, tingling, focal weakness, weakness and headaches.  Endo/Heme/Allergies:  Does not bruise/bleed easily.  Psychiatric/Behavioral:  Negative for depression. The  patient is not nervous/anxious and does not have insomnia.     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 Bosie - NORMAL CORONARIES   CHOLECYSTECTOMY N/A 11/02/2017   Procedure: LAPAROSCOPIC CHOLECYSTECTOMY;  Surgeon: [name], MD;  Location: [facility] ORS;  Service: General;  Laterality: N/A;   RIGHT COLECTOMY  2009   at [facility]    SOCIAL HISTORY: Social History   Socioeconomic History   Marital status: Married    Spouse name: Not on file   Number of children: Not on file   Years of education: Not on file   Highest education level: Not on file  Occupational History   Not on file  Tobacco Use   Smoking status: Former   Smokeless tobacco: Current    Types: Chew  Vaping Use   Vaping status: Never Used  Substance and Sexual Activity   Alcohol use: Not Currently   Drug use: Never   Sexual activity: Not on file  Other Topics Concern   Not on file  Social History Narrative   Not on file   Social Drivers of Health   Financial Resource Strain: Not on file  Food Insecurity: No Food Insecurity (12/22/2023)   Hunger Vital Sign    Worried About Running Out of Food in the Last Year: Never true    Ran Out of Food in the Last Year: Never true  Transportation  Needs: No Transportation Needs (12/22/2023)   PRAPARE - Administrator, Civil Service (Medical): No    Lack of Transportation (Non-Medical): No  Physical Activity: Not on file  Stress: Not on file  Social Connections: Unknown (12/12/2023)   Social Connection and Isolation Panel    Frequency of Communication with Friends and Family: More than three times a week    Frequency of Social Gatherings with Friends and Family: More  than three times a week    Attends Religious Services: Patient declined    Database administrator or Organizations: Patient declined    Attends Banker Meetings: Patient declined    Marital Status: Patient declined  Intimate Partner Violence: Not At Risk (12/22/2023)   Humiliation, Afraid, Rape, and Kick questionnaire    Fear of Current or Ex-Partner: No    Emotionally Abused: No    Physically Abused: No    Sexually Abused: No    FAMILY HISTORY: Family History  Problem Relation Age of Onset   CAD Father    Diabetes Brother    Bladder Cancer Other     ALLERGIES:  is allergic to codeine.  MEDICATIONS:  Current Outpatient Medications  Medication Sig Dispense Refill   amitriptyline  (ELAVIL ) 10 MG tablet Take 10 mg by mouth at bedtime.     aspirin  EC 81 MG tablet Take 81 mg by mouth daily.     atorvastatin  (LIPITOR) 40 MG tablet Take 40 mg by mouth at bedtime.     Calcium  Carb-Cholecalciferol 600-10 MG-MCG TABS Take 1 tablet by mouth 2 (two) times daily.     Ferrous Sulfate (IRON) 325 (65 Fe) MG TABS Take 1 tablet by mouth 2 (two) times daily.     FLUoxetine  (PROZAC ) 20 MG capsule Take 20 mg by mouth daily.     loratadine (CLARITIN) 10 MG tablet Take 10 mg by mouth daily.     omeprazole (PRILOSEC) 20 MG capsule Take 20 mg by mouth daily.     traZODone  (DESYREL ) 100 MG tablet Take 100 mg by mouth at bedtime.     folic acid  (FOLVITE ) 1 MG tablet Take 1 tablet (1 mg total) by mouth daily. 90 tablet 1   predniSONE  (DELTASONE ) 20 MG tablet Take 3 tablets (60 mg total) by mouth daily with breakfast. 60 tablet 0   No current facility-administered medications for this visit.    PHYSICAL EXAMINATION:   Vitals:   12/22/23 0937  BP: 121/67  Pulse: 99  Resp: 19  Temp: 99 F (37.2 C)  SpO2: 100%   Filed Weights   12/22/23 0937  Weight: 189 lb (85.7 kg)    Physical Exam Vitals and nursing note reviewed.  HENT:     Head: Normocephalic and atraumatic.      Mouth/Throat:     Pharynx: Oropharynx is clear.  Eyes:     Extraocular Movements: Extraocular movements intact.     Pupils: Pupils are equal, round, and reactive to light.  Cardiovascular:     Rate and Rhythm: Normal rate and regular rhythm.  Pulmonary:     Comments: Decreased breath sounds bilaterally.  Abdominal:     Palpations: Abdomen is soft.  Musculoskeletal:        General: Normal range of motion.     Cervical back: Normal range of motion.  Skin:    General: Skin is warm.  Neurological:     General: No focal deficit present.     Mental Status: He is alert and oriented  to person, place, and time.  Psychiatric:        Behavior: Behavior normal.        Judgment: Judgment normal.     LABORATORY DATA:  I have reviewed the data as listed Lab Results  Component Value Date   WBC 5.0 12/22/2023   HGB 9.5 (L) 12/22/2023   HCT 27.2 (L) 12/22/2023   MCV 105.4 (H) 12/22/2023   PLT 132 (L) 12/22/2023   Recent Labs    12/12/23 1656 12/13/23 0635 12/14/23 0416 12/15/23 0329 12/22/23 0851  NA  --    < > 129* 133* 135  K  --    < > 3.9 4.6 3.4*  CL  --    < > 101 103 103  CO2  --    < > 22 25 22   GLUCOSE  --    < > 164* 145* 115*  BUN  --    < > 17 21 27*  CREATININE  --    < > 1.04 0.86 1.26*  CALCIUM   --    < > 8.9 9.5 9.5  GFRNONAA  --    < > >60 >60 59*  PROT  --    < > 6.2* 5.8* 6.3*  ALBUMIN  --    < > 3.0* 2.9* 3.6  AST  --    < > 48* 41 37  ALT  --    < > 46* 41 52*  ALKPHOS  --    < > 92 82 95  BILITOT 4.1*   < > 2.9* 2.7* 4.2*  BILIDIR 0.9*  --   --   --   --   IBILI 3.2*  --   --   --   --    < > = values in this interval not displayed.    RADIOGRAPHIC STUDIES: I have personally reviewed the radiological images as listed and agreed with the findings in the report. CT ABDOMEN PELVIS W CONTRAST Result Date: 12/12/2023 CLINICAL DATA:  Evaluation of metastatic disease. History of hypertension, hyperlipidemia, and abnormal labs. EXAM: CT ABDOMEN AND PELVIS  WITH CONTRAST TECHNIQUE: Multidetector CT imaging of the abdomen and pelvis was performed using the standard protocol following bolus administration of intravenous contrast. RADIATION DOSE REDUCTION: This exam was performed according to the departmental dose-optimization program which includes automated exposure control, adjustment of the mA and/or kV according to patient size and/or use of iterative reconstruction technique. CONTRAST:  OMNIPAQUE  IOHEXOL  350 MG/ML SOLN COMPARISON:  02/21/2023 FINDINGS: Lower chest: Small bilateral pleural effusions with basilar atelectasis. Hepatobiliary: No focal liver lesions. Mild periportal edema may indicate infectious or inflammatory process such as hepatitis. Surgical absence of the gallbladder. No bile duct dilatation. Pancreas: Unremarkable. No pancreatic ductal dilatation or surrounding inflammatory changes. Spleen: Normal in size without focal abnormality. Adrenals/Urinary Tract: Adrenal glands are unremarkable. Kidneys are normal, without renal calculi, focal lesion, or hydronephrosis. Bladder is unremarkable. Stomach/Bowel: Stomach, small bowel, and colon are not abnormally distended. Stool throughout the colon. Previous right colon resection with ileocolonic anastomosis. Appendix is surgically absent. Vascular/Lymphatic: Aortic atherosclerosis. No enlarged abdominal or pelvic lymph nodes. Reproductive: Prostate is unremarkable. Other: Small amount of free fluid in the abdomen around the liver and extending along the pericolic gutters. This may be reactive. No free air. Abdominal wall musculature appears intact. Musculoskeletal: Degenerative changes in the spine. Curvilinear calcifications in the superior femoral heads bilaterally consistent with avascular necrosis. IMPRESSION: 1. Small bilateral pleural effusions with basilar atelectasis. 2. Mild  periportal edema in the liver. This is nonspecific but could indicate infectious or inflammatory process such as  hepatitis. 3. Aortic atherosclerosis. 4. 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 Electronically Signed   By: Elsie Gravely M.D.   On: 12/12/2023 17:21   CT Angio Chest Pulmonary Embolism (PE) W or WO Contrast Result Date: 12/12/2023 CLINICAL DATA:  Pulmonary embolus suspected with high probability. Hypertension, hyperlipidemia. Abnormal lab work. EXAM: CT ANGIOGRAPHY CHEST WITH CONTRAST TECHNIQUE: Multidetector CT imaging of the chest was performed using the standard protocol during bolus administration of intravenous contrast. Multiplanar CT image reconstructions and MIPs were obtained to evaluate the vascular anatomy. RADIATION DOSE REDUCTION: This exam was performed according to the departmental dose-optimization program which includes automated exposure control, adjustment of the mA and/or kV according to patient size and/or use of iterative reconstruction technique. CONTRAST:  OMNIPAQUE  IOHEXOL  350 MG/ML SOLN COMPARISON:  Chest radiograph 12/12/2023 FINDINGS: Cardiovascular: Technically adequate study with good opacification of the central and segmental pulmonary arteries. Mild motion artifact. No focal filling defects. No evidence of significant pulmonary embolus. Normal heart size. No pericardial effusions. Calcification of the aorta and coronary arteries. No aneurysm. Mediastinum/Nodes: No enlarged mediastinal, hilar, or axillary lymph nodes. Thyroid gland, trachea, and esophagus demonstrate no significant findings. Lungs/Pleura: Small bilateral pleural effusions. Minimal atelectasis in the lung bases. No airspace disease or consolidation is suggested. No pneumothorax. Upper Abdomen: Surgical absence of the gallbladder. Small amount of free fluid around the liver possibly ascites. Musculoskeletal: No chest wall abnormality. No acute or significant osseous findings. Review of the MIP images confirms the above findings. IMPRESSION: 1. No evidence of significant pulmonary embolus. 2.  Small bilateral pleural effusions with minimal basilar atelectasis. 3. Small amount of free fluid noted around the liver suggesting ascites. Electronically Signed   By: Elsie Gravely M.D.   On: 12/12/2023 17:16   DG Chest 2 View Result Date: 12/12/2023 CLINICAL DATA:  dyspnea. EXAM: CHEST - 2 VIEW COMPARISON:  04/29/2019. FINDINGS: Low lung volume. Bilateral lung fields are clear. Bilateral costophrenic angles are clear. Stable cardio-mediastinal silhouette. No acute osseous abnormalities. The soft tissues are within normal limits. IMPRESSION: No active cardiopulmonary disease. Electronically Signed   By: Ree Molt M.D.   On: 12/12/2023 11:32     Warm autoimmune hemolytic anemia (HCC) # AUG 2025- [[facility]] -Predominant hemolytic anemia DAT positive warm mild thrombocytopenia--s/p 1u pRBC for Hgb 6.1-patient then --received IV solumedrol for 3 days and discharged on prednisone  80 mg daily. CT scan abdomen pelvis no evidence of any-malignancy noted.  CT chest unremarkable.   # Continue prednisone -however will taper the dose to 60 mg a day hemoglobin improved at 9.4.  If no significant improvement noted consider adding rituximab  infusion.  Hepatitis panel negative. Continue folic acid .   # Confusion question steroid psychosis-recommend tapering the dose of steroids and monitor closely.  # Social issues: from the hospital- Patient was given a prescription for 80 mg for 2 weeks.  However only after 5 days noted to have running out of prednisone . Patient and sonhave difficulty following instructions/so would recommend watching blood counts closely on a weekly basis.   # DISPOSITION: # follow up in 1 week- APP- labs-cbc/LDH; CMP- Dr.B    Above plan of care was discussed with patient/family in detail.  My contact information was given to the patient/family.       Cindy JONELLE Joe, MD 12/22/2023 11:17 AM

## 2023-12-22 NOTE — Assessment & Plan Note (Addendum)
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 CT scan abdomen pelvis no evidence of any-malignancy noted.  CT chest unremarkable.   # Continue prednisone -however will taper the dose to 60 mg a day hemoglobin improved at 9.4.  If no significant improvement noted consider adding rituximab infusion.  Hepatitis panel negative. Continue folic acid .   # Confusion question steroid psychosis-recommend tapering the dose of steroids and monitor closely.  # Social issues: from the hospital- Patient was given a prescription for 80 mg for 2 weeks.  However only after 5 days noted to have running out of prednisone . Patient and sonhave difficulty following instructions/so would recommend watching blood counts closely on a weekly basis.   # DISPOSITION: # follow up in 1 week- APP- labs-cbc/LDH; CMP- Dr.B

## 2023-12-22 NOTE — Progress Notes (Signed)
 Patient has weakness. Dyspnea with exertion. Has dizziness. Appetite is low. Hard black stools, some incontinence.  Living with his son, [name] . Has chronic back pain. Critical lab called by Luke in lab Total Bili is 4.2. Read back. Dr B notified.

## 2023-12-23 ENCOUNTER — Emergency Department

## 2023-12-23 ENCOUNTER — Other Ambulatory Visit: Payer: Self-pay

## 2023-12-23 ENCOUNTER — Encounter: Payer: Self-pay | Admitting: Emergency Medicine

## 2023-12-23 ENCOUNTER — Observation Stay
Admission: EM | Admit: 2023-12-23 | Discharge: 2023-12-26 | Disposition: A | Discharge status: Home / Self Care | Attending: Internal Medicine | Admitting: Internal Medicine

## 2023-12-23 DIAGNOSIS — D591 Autoimmune hemolytic anemia, unspecified: Secondary | ICD-10-CM | POA: Insufficient documentation

## 2023-12-23 DIAGNOSIS — D5911 Warm autoimmune hemolytic anemia: Secondary | ICD-10-CM

## 2023-12-23 DIAGNOSIS — R0602 Shortness of breath: Secondary | ICD-10-CM | POA: Insufficient documentation

## 2023-12-23 DIAGNOSIS — I10 Essential (primary) hypertension: Secondary | ICD-10-CM | POA: Diagnosis not present

## 2023-12-23 DIAGNOSIS — D61818 Other pancytopenia: Secondary | ICD-10-CM | POA: Diagnosis not present

## 2023-12-23 DIAGNOSIS — M6281 Muscle weakness (generalized): Secondary | ICD-10-CM | POA: Diagnosis not present

## 2023-12-23 DIAGNOSIS — E876 Hypokalemia: Secondary | ICD-10-CM | POA: Diagnosis not present

## 2023-12-23 DIAGNOSIS — G9341 Metabolic encephalopathy: Secondary | ICD-10-CM | POA: Insufficient documentation

## 2023-12-23 DIAGNOSIS — E785 Hyperlipidemia, unspecified: Secondary | ICD-10-CM | POA: Diagnosis not present

## 2023-12-23 DIAGNOSIS — Z7982 Long term (current) use of aspirin: Secondary | ICD-10-CM | POA: Diagnosis not present

## 2023-12-23 DIAGNOSIS — R4182 Altered mental status, unspecified: Secondary | ICD-10-CM

## 2023-12-23 DIAGNOSIS — Z79899 Other long term (current) drug therapy: Secondary | ICD-10-CM | POA: Insufficient documentation

## 2023-12-23 DIAGNOSIS — R2681 Unsteadiness on feet: Secondary | ICD-10-CM | POA: Insufficient documentation

## 2023-12-23 DIAGNOSIS — M545 Low back pain, unspecified: Secondary | ICD-10-CM | POA: Diagnosis present

## 2023-12-23 DIAGNOSIS — Z87891 Personal history of nicotine dependence: Secondary | ICD-10-CM | POA: Insufficient documentation

## 2023-12-23 DIAGNOSIS — G9340 Encephalopathy, unspecified: Secondary | ICD-10-CM | POA: Insufficient documentation

## 2023-12-23 LAB — AMMONIA: Ammonia: 23 umol/L (ref 9–35)

## 2023-12-23 LAB — URINALYSIS, ROUTINE W REFLEX MICROSCOPIC
Bilirubin Urine: NEGATIVE
Glucose, UA: NEGATIVE mg/dL
Hgb urine dipstick: NEGATIVE
Ketones, ur: NEGATIVE mg/dL
Leukocytes,Ua: NEGATIVE
Nitrite: NEGATIVE
Protein, ur: NEGATIVE mg/dL
Specific Gravity, Urine: 1.01 (ref 1.005–1.030)
pH: 6 (ref 5.0–8.0)

## 2023-12-23 LAB — COMPREHENSIVE METABOLIC PANEL WITH GFR
ALT: 44 U/L (ref 0–44)
AST: 35 U/L (ref 15–41)
Albumin: 3.4 g/dL — ABNORMAL LOW (ref 3.5–5.0)
Alkaline Phosphatase: 84 U/L (ref 38–126)
Anion gap: 8 (ref 5–15)
BUN: 18 mg/dL (ref 8–23)
CO2: 23 mmol/L (ref 22–32)
Calcium: 8.9 mg/dL (ref 8.9–10.3)
Chloride: 102 mmol/L (ref 98–111)
Creatinine, Ser: 1.14 mg/dL (ref 0.61–1.24)
GFR, Estimated: >60 mL/min (ref >60)
Glucose, Bld: 93 mg/dL (ref 70–99)
Potassium: 2.7 mmol/L — CL (ref 3.5–5.1)
Sodium: 133 mmol/L — ABNORMAL LOW (ref 135–145)
Total Bilirubin: 4.5 mg/dL — ABNORMAL HIGH (ref 0.0–1.2)
Total Protein: 6 g/dL — ABNORMAL LOW (ref 6.5–8.1)

## 2023-12-23 LAB — CBC
HCT: 27.1 % — ABNORMAL LOW (ref 39.0–52.0)
Hemoglobin: 9.2 g/dL — ABNORMAL LOW (ref 13.0–17.0)
MCH: 36.5 pg — ABNORMAL HIGH (ref 26.0–34.0)
MCHC: 33.9 g/dL (ref 30.0–36.0)
MCV: 107.5 fL — ABNORMAL HIGH (ref 80.0–100.0)
Platelets: 112 K/uL — ABNORMAL LOW (ref 150–400)
RBC: 2.52 MIL/uL — ABNORMAL LOW (ref 4.22–5.81)
RDW: 17.8 % — ABNORMAL HIGH (ref 11.5–15.5)
WBC: 3.7 K/uL — ABNORMAL LOW (ref 4.0–10.5)
nRBC: 0 % (ref 0.0–0.2)

## 2023-12-23 LAB — TROPONIN I (HIGH SENSITIVITY): Troponin I (High Sensitivity): 17 ng/L (ref <18)

## 2023-12-23 LAB — RESP PANEL BY RT-PCR (RSV, FLU A&B, COVID)  RVPGX2
Influenza A by PCR: NEGATIVE
Influenza B by PCR: NEGATIVE
Resp Syncytial Virus by PCR: NEGATIVE
SARS Coronavirus 2 by RT PCR: NEGATIVE

## 2023-12-23 LAB — MAGNESIUM: Magnesium: 1.7 mg/dL (ref 1.7–2.4)

## 2023-12-23 LAB — POTASSIUM: Potassium: 4.1 mmol/L (ref 3.5–5.1)

## 2023-12-23 MED ORDER — TRAZODONE HCL 100 MG PO TABS
100.0000 mg | ORAL_TABLET | Freq: Every day | ORAL | Status: DC
  Administered 2023-12-23 – 2023-12-25 (×3): 100 mg via ORAL
  Filled 2023-12-23 (×3): qty 1

## 2023-12-23 MED ORDER — SENNOSIDES-DOCUSATE SODIUM 8.6-50 MG PO TABS
1.0000 | ORAL_TABLET | Freq: Every evening | ORAL | Status: DC | PRN
  Administered 2023-12-24: 1 via ORAL
  Filled 2023-12-23: qty 1

## 2023-12-23 MED ORDER — PANTOPRAZOLE SODIUM 40 MG PO TBEC
40.0000 mg | DELAYED_RELEASE_TABLET | Freq: Every day | ORAL | Status: DC
  Administered 2023-12-23 – 2023-12-26 (×4): 40 mg via ORAL
  Filled 2023-12-23 (×4): qty 1

## 2023-12-23 MED ORDER — POTASSIUM CHLORIDE 10 MEQ/100ML IV SOLN
10.0000 meq | Freq: Once | INTRAVENOUS | Status: AC
  Administered 2023-12-23: 10 meq via INTRAVENOUS
  Filled 2023-12-23: qty 100

## 2023-12-23 MED ORDER — LISINOPRIL 20 MG PO TABS
20.0000 mg | ORAL_TABLET | Freq: Every day | ORAL | Status: DC
  Administered 2023-12-23 – 2023-12-26 (×4): 20 mg via ORAL
  Filled 2023-12-23: qty 2
  Filled 2023-12-23 (×3): qty 1

## 2023-12-23 MED ORDER — ATORVASTATIN CALCIUM 20 MG PO TABS
40.0000 mg | ORAL_TABLET | Freq: Every day | ORAL | Status: DC
  Administered 2023-12-23 – 2023-12-25 (×3): 40 mg via ORAL
  Filled 2023-12-23 (×3): qty 2

## 2023-12-23 MED ORDER — ONDANSETRON HCL 4 MG PO TABS: 4.0000 mg | ORAL_TABLET | Freq: Four times a day (QID) | ORAL | Status: DC | PRN

## 2023-12-23 MED ORDER — MELATONIN 5 MG PO TABS
5.0000 mg | ORAL_TABLET | Freq: Every day | ORAL | Status: DC
  Administered 2023-12-23 – 2023-12-25 (×3): 5 mg via ORAL
  Filled 2023-12-23 (×3): qty 1

## 2023-12-23 MED ORDER — POTASSIUM CHLORIDE CRYS ER 20 MEQ PO TBCR
40.0000 meq | EXTENDED_RELEASE_TABLET | Freq: Once | ORAL | Status: AC
  Administered 2023-12-23: 40 meq via ORAL
  Filled 2023-12-23: qty 2

## 2023-12-23 MED ORDER — ASPIRIN 81 MG PO TBEC
81.0000 mg | DELAYED_RELEASE_TABLET | Freq: Every day | ORAL | Status: DC
  Administered 2023-12-23 – 2023-12-26 (×4): 81 mg via ORAL
  Filled 2023-12-23 (×4): qty 1

## 2023-12-23 MED ORDER — AMITRIPTYLINE HCL 10 MG PO TABS
10.0000 mg | ORAL_TABLET | Freq: Every day | ORAL | Status: DC
  Administered 2023-12-23 – 2023-12-25 (×3): 10 mg via ORAL
  Filled 2023-12-23 (×4): qty 1

## 2023-12-23 MED ORDER — ACETAMINOPHEN 325 MG PO TABS
650.0000 mg | ORAL_TABLET | Freq: Four times a day (QID) | ORAL | Status: DC | PRN
  Administered 2023-12-23 – 2023-12-24 (×2): 650 mg via ORAL
  Filled 2023-12-23 (×2): qty 2

## 2023-12-23 MED ORDER — MORPHINE SULFATE (PF) 4 MG/ML IV SOLN
4.0000 mg | Freq: Once | INTRAVENOUS | Status: AC
  Administered 2023-12-23: 4 mg via INTRAVENOUS
  Filled 2023-12-23: qty 1

## 2023-12-23 MED ORDER — LORATADINE 10 MG PO TABS
10.0000 mg | ORAL_TABLET | Freq: Every day | ORAL | Status: DC
  Administered 2023-12-23 – 2023-12-26 (×4): 10 mg via ORAL
  Filled 2023-12-23 (×4): qty 1

## 2023-12-23 MED ORDER — PREDNISONE 20 MG PO TABS
30.0000 mg | ORAL_TABLET | Freq: Two times a day (BID) | ORAL | Status: DC
  Administered 2023-12-23 – 2023-12-26 (×7): 30 mg via ORAL
  Filled 2023-12-23 (×7): qty 1

## 2023-12-23 MED ORDER — ONDANSETRON HCL 4 MG/2ML IJ SOLN: 4.0000 mg | Freq: Four times a day (QID) | INTRAMUSCULAR | Status: DC | PRN

## 2023-12-23 MED ORDER — ACETAMINOPHEN 650 MG RE SUPP: 650.0000 mg | Freq: Four times a day (QID) | RECTAL | Status: DC | PRN

## 2023-12-23 MED ORDER — MAGNESIUM OXIDE -MG SUPPLEMENT 400 (240 MG) MG PO TABS
800.0000 mg | ORAL_TABLET | Freq: Once | ORAL | Status: AC
  Administered 2023-12-23: 800 mg via ORAL
  Filled 2023-12-23: qty 2

## 2023-12-23 MED ORDER — SODIUM CHLORIDE 0.9 % IV BOLUS
1000.0000 mL | Freq: Once | INTRAVENOUS | Status: AC
  Administered 2023-12-23: 1000 mL via INTRAVENOUS

## 2023-12-23 MED ORDER — ONDANSETRON HCL 4 MG/2ML IJ SOLN
4.0000 mg | Freq: Once | INTRAMUSCULAR | Status: AC
  Administered 2023-12-23: 4 mg via INTRAVENOUS
  Filled 2023-12-23: qty 2

## 2023-12-23 MED ORDER — FLUOXETINE HCL 20 MG PO CAPS
20.0000 mg | ORAL_CAPSULE | Freq: Every day | ORAL | Status: DC
Start: 2023-12-23 — End: 2023-12-27
  Administered 2023-12-23 – 2023-12-26 (×4): 20 mg via ORAL
  Filled 2023-12-23 (×4): qty 1

## 2023-12-23 NOTE — ED Provider Notes (Signed)
 [facility] Provider Note    Event Date/Time   First MD Initiated Contact with Patient 12/23/23 [phone]     (approximate)   History   Weakness and Back Pain   HPI  Brian Montes is a 78 y.o. male with a history of anemia, hyponatremia, hypertension who presents with weakness and diffuse bodyaches.  The son reported confusion and generalized weakness for the last few days.  The patient states that he has pain all over his body.  He states he feels weak and lightheaded.  He states he cannot sleep.  He also reports nausea and shortness of breath.  He denies any diarrhea.  Son reported confusion and weakness x 3 days.  Patient reports pain all over, weakness, lightheadedness, can't sleep, nausea, short of breath  I reviewed the past medical records.  The patient was admitted to the hospitalist service last month, discharged on 8/29, with pancytopenia, thought to be autoimmune hemolytic anemia.  He received a unit of PRBC transfusion and was given steroids.   Physical Exam   Triage Vital Signs: ED Triage Vitals  Encounter Vitals Group     BP 12/23/23 0353 (!) 154/103     Girls Systolic BP Percentile --      Girls Diastolic BP Percentile --      Boys Systolic BP Percentile --      Boys Diastolic BP Percentile --      Pulse Rate 12/23/23 0353 (!) 109     Resp 12/23/23 0353 19     Temp 12/23/23 0353 98.4 F (36.9 C)     Temp Source 12/23/23 0353 Oral     SpO2 12/23/23 0352 100 %     Weight --      Height 12/23/23 0354 5' 9 (1.753 m)     Head Circumference --      Peak Flow --      Pain Score 12/23/23 0353 10     Pain Loc --      Pain Education --      Exclude from Growth Chart --     Most recent vital signs: Vitals:   12/23/23 1011 12/23/23 1020  BP: (!) 107/39 (!) 149/69  Pulse: 79 68  Resp: (!) 25 13  Temp:    SpO2: 100% 100%     General: Alert, oriented x 2, uncomfortable appearing but in no distress.  CV:  Good peripheral perfusion.   Resp:  Normal effort.  Lungs CTAB. Abd:  Soft with mild diffuse discomfort.  No distention.  Other:  No significant peripheral edema.  EOMI.  PERRLA.  Normal speech.  Motor intact in all extremities.  Dry mucous membranes.   ED Results / Procedures / Treatments   Labs (all labs ordered are listed, but only abnormal results are displayed) Labs Reviewed  COMPREHENSIVE METABOLIC PANEL WITH GFR - Abnormal; Notable for the following components:      Result Value   Sodium 133 (*)    Potassium 2.7 (*)    Total Protein 6.0 (*)    Albumin 3.4 (*)    Total Bilirubin 4.5 (*)    All other components within normal limits  CBC - Abnormal; Notable for the following components:   WBC 3.7 (*)    RBC 2.52 (*)    Hemoglobin 9.2 (*)    HCT 27.1 (*)    MCV 107.5 (*)    MCH 36.5 (*)    RDW 17.8 (*)    Platelets 112 (*)  All other components within normal limits  RESP PANEL BY RT-PCR (RSV, FLU A&B, COVID)  RVPGX2  URINALYSIS, ROUTINE W REFLEX MICROSCOPIC  MAGNESIUM   POTASSIUM  CBG MONITORING, ED  TROPONIN I (HIGH SENSITIVITY)     EKG  ED ECG REPORT I, Waylon Cassis, the attending physician, personally viewed and interpreted this ECG.  Date: 12/23/2023 EKG Time: 0359 Rate: 100 Rhythm: normal sinus rhythm QRS Axis: normal Intervals: RBBB ST/T Wave abnormalities: Nonspecific T wave abnormalities Narrative Interpretation: no evidence of acute ischemia    RADIOLOGY  Chest x-ray: I independently viewed and interpreted the images; there is no focal consolidation or edema  CT head: Atrophy with no acute intracranial abnormality  PROCEDURES:  Critical Care performed: No  Procedures   MEDICATIONS ORDERED IN ED: Medications  potassium chloride  SA (KLOR-CON  M) CR tablet 40 mEq (40 mEq Oral Given 12/23/23 0623)  sodium chloride  0.9 % bolus 1,000 mL (0 mLs Intravenous Stopped 12/23/23 1010)  morphine  (PF) 4 MG/ML injection 4 mg (4 mg Intravenous Given 12/23/23 0734)  ondansetron   (ZOFRAN ) injection 4 mg (4 mg Intravenous Given 12/23/23 0732)  potassium chloride  10 mEq in 100 mL IVPB (0 mEq Intravenous Stopped 12/23/23 0849)  potassium chloride  SA (KLOR-CON  M) CR tablet 40 mEq (40 mEq Oral Given 12/23/23 1023)     IMPRESSION / MDM / ASSESSMENT AND PLAN / ED COURSE  I reviewed the triage vital signs and the nursing notes.  78 year old male with PMH as noted above presents with diffuse body pain, generalized weakness, insomnia, and a report of confusion from his son.  On exam the patient is hypertensive.  Other vital signs are normal.  He is alert but uncomfortable appearing.  Neuro exam is nonfocal.  Differential diagnosis includes, but is not limited to, electrolyte abnormality, dehydration, other metabolic cause, COVID, UTI, other acute infection, less likely CNS or cardiac cause.  BMP reveals hypokalemia.  CBC is unremarkable.  We will obtain a urinalysis, cardiac enzymes, CT head, chest x-ray, give potassium replacement and fluids and reassess.  Patient's presentation is most consistent with acute presentation with potential threat to life or bodily function.  The patient is on the cardiac monitor to evaluate for evidence of arrhythmia and/or significant heart rate changes.  ----------------------------------------- 10:26 AM on 12/23/2023 -----------------------------------------  I spoke to the patient's son who confirms the history given above.  He reports that the patient has been confused for the last several days.  There was some concern by his outpatient physicians that it could have been due to the high dose of his steroid that he was started on after his recent discharge.  On reassessment the patient is comfortable appearing.  He remains stable.  Other than the hypokalemia, the rest of the workup so far is unremarkable.  CT head is negative for acute findings.  Respiratory panel is negative.  Urinalysis is pending.  Given the continued altered mental status and  hypokalemia the patient will benefit from inpatient admission for further monitoring and treatment.  I consulted Dr. Laurita from the hospitalist service; based on our discussion he agrees to evaluate the patient for admission.   FINAL CLINICAL IMPRESSION(S) / ED DIAGNOSES   Final diagnoses:  Hypokalemia  Altered mental status, unspecified altered mental status type     Rx / DC Orders   ED Discharge Orders     None        Note:  This document was prepared using Dragon voice recognition software and may include unintentional dictation  errors.    Jacolyn Pae, MD 12/23/23 1027

## 2023-12-23 NOTE — Plan of Care (Signed)

## 2023-12-23 NOTE — ED Triage Notes (Addendum)
 Pt arrives POV w/ son who reports pt is c/o pain all over, confusion, and weakness x 3 days. Recent admission for pancytopenia. Pt reports something is wrong with his blood  Pt also report sob and nausea.

## 2023-12-23 NOTE — H&P (Addendum)
 History and Physical    Brian Montes FMW:[id number] DOB: 1945/10/14 DOA: 12/23/2023  PCP: [facility] (Confirm with patient/family/NH records and if not entered, this has to be entered at [facility] point of entry) Patient coming from: Home  I have personally briefly reviewed patient's old medical records in [facility]  Chief Complaint: Feeling weak, can not sleep  HPI: Brian Montes is a 78 y.o. 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  Patient was recently hospitalized for new onset of pancytopenia, when he was diagnosed with autoimmune hemolytic anemia.  Family reported that patient was started on high-dose of prednisone  80 mg daily about 1 week ago and since then patient has had very poor sleep at night and confused multiple times and once drove to [place] and night, and was found by police who called the family to pick him up from [facility] and patient obviously was confused and not even sure why he was here.  Patient was seen by hematology yesterday and started decreased his prednisone  from 80 mg daily to 60 mg daily but patient has not been able to start yet.  Patient reported that he has been feeling no appetite for last 2 days with very little intake.  And after taking the prednisone  for the first time, patient found very difficult to go to sleep at night.  This morning, family found patient was confused and sent patient to ED.  Patient denied any nauseous vomiting no diarrhea he denied any dysuria or increased urine frequency.  ED Course: Afebrile, nontachycardic blood pressure 150/60 O2 sat 100% on room air.  Blood work showed K2.7 hemoglobin 9.2 WBC 3.7 platelet 112, BUN 18 creatinine 1.9 bicarb 23 bilirubin 4.5.  Patient was given IV and p.o. KCl  Review of Systems: As per HPI otherwise 14 point review of systems negative.     Past Medical History:  Diagnosis Date   Acid reflux    Calculus of gallbladder with acute and chronic cholecystitis without obstruction    Essential hypertension    Hyperlipidemia due to dietary fat intake     Past Surgical History:  Procedure Laterality Date   CARDIAC CATHETERIZATION  2010   Dr. Bosie - NORMAL CORONARIES   CHOLECYSTECTOMY N/A 11/02/2017   Procedure: LAPAROSCOPIC CHOLECYSTECTOMY;  Surgeon: [name], MD;  Location: [facility] ORS;  Service: General;  Laterality: N/A;   RIGHT COLECTOMY  2009   at [facility]     reports that he has quit smoking. His smokeless tobacco use includes chew. He reports that he does not currently use alcohol. He reports that he does not use drugs.  Allergies  Allergen Reactions   Codeine Other (See Comments)    Family History  Problem Relation Age of Onset   CAD Father    Diabetes Brother    Bladder Cancer Other      Prior to Admission medications   Medication Sig Start Date End Date Taking? Authorizing Provider  amitriptyline  (ELAVIL ) 10 MG tablet Take 10 mg by mouth at bedtime.    [provider]  aspirin  EC 81 MG tablet Take 81 mg by mouth daily.    [provider]  atorvastatin  (LIPITOR) 40 MG tablet Take 40 mg by mouth at bedtime. 08/15/17   [provider]  Calcium  Carb-Cholecalciferol 600-10 MG-MCG TABS Take 1 tablet by mouth 2 (two) times daily.    [provider]  Ferrous Sulfate (IRON) 325 (65 Fe) MG TABS Take 1 tablet by mouth 2 (two) times daily. 12/11/23   [provider]  FLUoxetine  (PROZAC ) 20 MG capsule Take 20 mg by mouth daily.    [provider]  folic acid  (FOLVITE ) 1 MG tablet Take 1 tablet (1 mg total) by mouth daily. 12/22/23   Brahmanday, Govinda R, MD  loratadine  (CLARITIN ) 10 MG tablet Take 10 mg by mouth daily.    [provider]  omeprazole (PRILOSEC) 20 MG capsule Take 20 mg by mouth daily. 10/09/17   [provider]  predniSONE   (DELTASONE ) 20 MG tablet Take 3 tablets (60 mg total) by mouth daily with breakfast. 12/22/23   Brahmanday, Govinda R, MD  traZODone  (DESYREL ) 100 MG tablet Take 100 mg by mouth at bedtime.    [provider]    Physical Exam: Vitals:   12/23/23 0730 12/23/23 0931 12/23/23 1011 12/23/23 1020  BP: (!) 163/65  (!) 107/39 (!) 149/69  Pulse: 68  79 68  Resp:   (!) 25 13  Temp:  98.4 F (36.9 C)    TempSrc:  Oral    SpO2: 100%  100% 100%  Height:        Constitutional: NAD, calm, comfortable Vitals:   12/23/23 0730 12/23/23 0931 12/23/23 1011 12/23/23 1020  BP: (!) 163/65  (!) 107/39 (!) 149/69  Pulse: 68  79 68  Resp:   (!) 25 13  Temp:  98.4 F (36.9 C)    TempSrc:  Oral    SpO2: 100%  100% 100%  Height:       Eyes: PERRL, lids and conjunctivae normal ENMT: Mucous membranes are moist. Posterior pharynx clear of any exudate or lesions.Normal dentition.  Neck: normal, supple, no masses, no thyromegaly Respiratory: clear to auscultation bilaterally, no wheezing, no crackles. Normal respiratory effort. No accessory muscle use.  Cardiovascular: Regular rate and rhythm, no murmurs / rubs / gallops. No extremity edema. 2+ pedal pulses. No carotid bruits.  Abdomen: no tenderness, no masses palpated. No hepatosplenomegaly. Bowel sounds positive.  Musculoskeletal: no clubbing / cyanosis. No joint deformity upper and lower extremities. Good ROM, no contractures. Normal muscle tone.  Skin: no rashes, lesions, ulcers. No induration Neurologic: CN 2-12 grossly intact. Sensation intact, DTR normal. Strength 5/5 in all 4.  Psychiatric: Normal judgment and insight. Alert and oriented x 3. Normal mood.     Labs on Admission: I have personally reviewed following labs and imaging studies  CBC: Recent Labs  Lab 12/22/23 0851 12/23/23 0420  WBC 5.0 3.7*  NEUTROABS 4.1  --   HGB 9.5* 9.2*  HCT 27.2* 27.1*  MCV 105.4* 107.5*  PLT 132* 112*   Basic Metabolic Panel: Recent Labs   Lab 12/22/23 0851 12/23/23 0420 12/23/23 0740  NA 135 133*  --   K 3.4* 2.7*  --   CL 103 102  --   CO2 22 23  --   GLUCOSE 115* 93  --   BUN 27* 18  --   CREATININE 1.26* 1.14  --   CALCIUM  9.5 8.9  --   MG  --   --  1.7   GFR: Estimated Creatinine Clearance: 59.8 mL/min (by C-G formula based on SCr of 1.14 mg/dL). Liver Function Tests: Recent Labs  Lab 12/22/23 0851 12/23/23 0420  AST 37 35  ALT 52* 44  ALKPHOS 95 84  BILITOT 4.2* 4.5*  PROT 6.3* 6.0*  ALBUMIN 3.6 3.4*   No  results for input(s): LIPASE, AMYLASE in the last 168 hours. No results for input(s): AMMONIA in the last 168 hours. Coagulation Profile: No results for input(s): INR, PROTIME in the last 168 hours. Cardiac Enzymes: No results for input(s): CKTOTAL, CKMB, CKMBINDEX, TROPONINI in the last 168 hours. BNP (last 3 results) No results for input(s): PROBNP in the last 8760 hours. HbA1C: No results for input(s): HGBA1C in the last 72 hours. CBG: No results for input(s): GLUCAP in the last 168 hours. Lipid Profile: No results for input(s): CHOL, HDL, LDLCALC, TRIG, CHOLHDL, LDLDIRECT in the last 72 hours. Thyroid Function Tests: No results for input(s): TSH, T4TOTAL, FREET4, T3FREE, THYROIDAB in the last 72 hours. Anemia Panel: No results for input(s): VITAMINB12, FOLATE, FERRITIN, TIBC, IRON, RETICCTPCT in the last 72 hours. Urine analysis:    Component Value Date/Time   COLORURINE YELLOW (A) 12/23/2023 1021   APPEARANCEUR CLEAR (A) 12/23/2023 1021   LABSPEC 1.010 12/23/2023 1021   PHURINE 6.0 12/23/2023 1021   GLUCOSEU NEGATIVE 12/23/2023 1021   HGBUR NEGATIVE 12/23/2023 1021   BILIRUBINUR NEGATIVE 12/23/2023 1021   KETONESUR NEGATIVE 12/23/2023 1021   PROTEINUR NEGATIVE 12/23/2023 1021   NITRITE NEGATIVE 12/23/2023 1021   LEUKOCYTESUR NEGATIVE 12/23/2023 1021    Radiological Exams on Admission: CT Head Wo Contrast Result Date:  12/23/2023 CLINICAL DATA:  Pain and confusion. 3 day history of weakness. Pancytopenia. EXAM: CT HEAD WITHOUT CONTRAST TECHNIQUE: Contiguous axial images were obtained from the base of the skull through the vertex without intravenous contrast. RADIATION DOSE REDUCTION: This exam was performed according to the departmental dose-optimization program which includes automated exposure control, adjustment of the mA and/or kV according to patient size and/or use of iterative reconstruction technique. COMPARISON:  None Available. FINDINGS: Brain: There is no evidence for acute hemorrhage, hydrocephalus, mass lesion, or abnormal extra-axial fluid collection. No definite CT evidence for acute infarction. Diffuse loss of parenchymal volume is consistent with atrophy. Patchy low attenuation in the deep hemispheric and periventricular white matter is nonspecific, but likely reflects chronic microvascular ischemic demyelination. Vascular: No hyperdense vessel or unexpected calcification. Skull: No evidence for fracture. No worrisome lytic or sclerotic lesion. Sinuses/Orbits: Left mastoid effusion evident. Visualized paranasal sinuses are clear. Visualized portions of the globes and intraorbital fat are unremarkable. Other: None. IMPRESSION: 1. No acute intracranial abnormality. 2. Atrophy with chronic small vessel ischemic disease. 3. Left mastoid effusion. Electronically Signed   By: Camellia Candle M.D.   On: 12/23/2023 08:09   DG Chest Port 1 View Result Date: 12/23/2023 EXAM: PA AND LATERAL (2 VIEWS) XRAY OF THE CHEST 12/23/2023 07:28:41 AM COMPARISON: PA and lateral radiographs of chest dated 12/12/2023. CLINICAL HISTORY: 141835 Weakness 141835. Weakness. FINDINGS: LUNGS AND PLEURA: No focal pulmonary opacity. No pulmonary edema. No pleural effusion. No pneumothorax. HEART AND MEDIASTINUM: No acute abnormality of the cardiac and mediastinal silhouettes. 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 IMPRESSION: 1. No acute process. Electronically signed by: Evalene Coho MD 12/23/2023 07:59 AM EDT RP Workstation: [id number]    EKG: Independently reviewed.  Sinus rhythm, chronic RBBB, no acute ST changes. Assessment/Plan Principal Problem:   Hypokalemia Active Problems:   Acute encephalopathy  (please populate well all problems here in Problem List. (For example, if patient is on BP meds at home and you resume or decide to hold them, it is a problem that needs to be her. Same for CAD, COPD, HLD and so on)  Acute metabolic encephalopathy - Improving, clinically suspect secondary to high-dose of steroid and sleep cycle disturbance.  Confirmed with son that we plan to start decreased dosage of prednisone  today and I recommend to split prednisone  dosage from 60 mg daily to 30 mg twice daily - UA showed no UTI - CT head reassuring - Patient will avoid daytime sedation medications.  Severe hypokalemia - Secondary to poor p.o. intake - P.o. and IV replacement - Check magnesium  level  Pancytopenia Autoimmune hemolytic anemia - WBC count, H&H and platelet count stable - As per hematology, patient was started on steroid.  HTN - Blood pressure medication were discontinued from last admission.  Which included HCTZ, lisinopril  and metoprolol  - Blood pressure borderline high today, will restart lisinopril  and consider titrate or add additional BP meds tomorrow.  DVT prophylaxis: SCD Code Status: Full code Family Communication: SON over the phone Disposition Plan: Expect less than 2 midnight hospital stay Consults called: None Admission status: Telemetry observation   Cort ONEIDA Mana MD [facility] Pager [phone]  12/23/2023, 10:58 AM

## 2023-12-24 DIAGNOSIS — E876 Hypokalemia: Secondary | ICD-10-CM | POA: Diagnosis not present

## 2023-12-24 LAB — CBC
HCT: 23.6 % — ABNORMAL LOW (ref 39.0–52.0)
Hemoglobin: 7.9 g/dL — ABNORMAL LOW (ref 13.0–17.0)
MCH: 36.6 pg — ABNORMAL HIGH (ref 26.0–34.0)
MCHC: 33.5 g/dL (ref 30.0–36.0)
MCV: 109.3 fL — ABNORMAL HIGH (ref 80.0–100.0)
Platelets: 84 K/uL — ABNORMAL LOW (ref 150–400)
RBC: 2.16 MIL/uL — ABNORMAL LOW (ref 4.22–5.81)
RDW: 17.2 % — ABNORMAL HIGH (ref 11.5–15.5)
WBC: 2.1 K/uL — ABNORMAL LOW (ref 4.0–10.5)
nRBC: 0 % (ref 0.0–0.2)

## 2023-12-24 LAB — COMPREHENSIVE METABOLIC PANEL WITH GFR
ALT: 33 U/L (ref 0–44)
AST: 26 U/L (ref 15–41)
Albumin: 2.7 g/dL — ABNORMAL LOW (ref 3.5–5.0)
Alkaline Phosphatase: 85 U/L (ref 38–126)
Anion gap: 3 — ABNORMAL LOW (ref 5–15)
BUN: 19 mg/dL (ref 8–23)
CO2: 25 mmol/L (ref 22–32)
Calcium: 8.5 mg/dL — ABNORMAL LOW (ref 8.9–10.3)
Chloride: 108 mmol/L (ref 98–111)
Creatinine, Ser: 1.2 mg/dL (ref 0.61–1.24)
GFR, Estimated: >60 mL/min (ref >60)
Glucose, Bld: 134 mg/dL — ABNORMAL HIGH (ref 70–99)
Potassium: 4.6 mmol/L (ref 3.5–5.1)
Sodium: 136 mmol/L (ref 135–145)
Total Bilirubin: 3.2 mg/dL — ABNORMAL HIGH (ref 0.0–1.2)
Total Protein: 5.2 g/dL — ABNORMAL LOW (ref 6.5–8.1)

## 2023-12-24 LAB — VITAMIN B1: Vitamin B1 (Thiamine): 57.4 nmol/L — ABNORMAL LOW (ref 66.5–200.0)

## 2023-12-24 MED ORDER — TRAMADOL HCL 50 MG PO TABS
50.0000 mg | ORAL_TABLET | Freq: Four times a day (QID) | ORAL | Status: DC | PRN
  Administered 2023-12-24 – 2023-12-26 (×5): 50 mg via ORAL
  Filled 2023-12-24 (×5): qty 1

## 2023-12-24 NOTE — Progress Notes (Signed)
 [facility]  - [place] [facility]   PATIENT NAME: Brian Montes    MR#:  [id number]  DATE OF BIRTH:  [birth year]  SUBJECTIVE:  no family at bedside. Attempted to call son left voicemail. Patient sitting out in the recliner. Hard of hearing. Eating lunch. Complains of back pain. Not really know why he is here in the hospital. Answered most questions appropriately    VITALS:  Blood pressure 127/60, pulse 61, temperature 97.6 F (36.4 C), temperature source Oral, resp. rate 20, height 5' 9 (1.753 m), weight 85.7 kg, SpO2 100%.  PHYSICAL EXAMINATION:   GENERAL:  78 y.o.-year-old patient with no acute distress.  LUNGS: Normal breath sounds bilaterally, no wheezing CARDIOVASCULAR: S1, S2 normal. No murmur   ABDOMEN: Soft, nontender, nondistended. Bowel sounds present.  EXTREMITIES: No  edema b/l.    NEUROLOGIC: nonfocal  patient is alert and awake   LABORATORY PANEL:  CBC Recent Labs  Lab 12/24/23 0451  WBC 2.1*  HGB 7.9*  HCT 23.6*  PLT 84*    Chemistries  Recent Labs  Lab 12/23/23 0740 12/23/23 1542 12/24/23 0451  NA  --   --  136  K  --    < > 4.6  CL  --   --  108  CO2  --   --  25  GLUCOSE  --   --  134*  BUN  --   --  19  CREATININE  --   --  1.20  CALCIUM   --   --  8.5*  MG 1.7  --   --   AST  --   --  26  ALT  --   --  33  ALKPHOS  --   --  85  BILITOT  --   --  3.2*   < > = values in this interval not displayed.   Cardiac Enzymes No results for input(s): TROPONINI in the last 168 hours. RADIOLOGY:  CT Head Wo Contrast Result Date: 12/23/2023 CLINICAL DATA:  Pain and confusion. 3 day history of weakness. Pancytopenia. EXAM: CT HEAD WITHOUT CONTRAST TECHNIQUE: Contiguous axial images were obtained from the base of the skull through the vertex without intravenous contrast. RADIATION DOSE REDUCTION: This exam was performed according to the departmental dose-optimization program which includes automated exposure control, adjustment of the  mA and/or kV according to patient size and/or use of iterative reconstruction technique. COMPARISON:  None Available. FINDINGS: Brain: There is no evidence for acute hemorrhage, hydrocephalus, mass lesion, or abnormal extra-axial fluid collection. No definite CT evidence for acute infarction. Diffuse loss of parenchymal volume is consistent with atrophy. Patchy low attenuation in the deep hemispheric and periventricular white matter is nonspecific, but likely reflects chronic microvascular ischemic demyelination. Vascular: No hyperdense vessel or unexpected calcification. Skull: No evidence for fracture. No worrisome lytic or sclerotic lesion. Sinuses/Orbits: Left mastoid effusion evident. Visualized paranasal sinuses are clear. Visualized portions of the globes and intraorbital fat are unremarkable. Other: None. IMPRESSION: 1. No acute intracranial abnormality. 2. Atrophy with chronic small vessel ischemic disease. 3. Left mastoid effusion. Electronically Signed   By: Camellia Candle M.D.   On: 12/23/2023 08:09   DG Chest Port 1 View Result Date: 12/23/2023 EXAM: PA AND LATERAL (2 VIEWS) XRAY OF THE CHEST 12/23/2023 07:28:41 AM COMPARISON: PA and lateral radiographs of chest dated 12/12/2023. CLINICAL HISTORY: 141835 Weakness 141835. Weakness. FINDINGS: LUNGS AND PLEURA: No focal pulmonary opacity. No pulmonary edema. No pleural effusion. No pneumothorax. HEART AND  MEDIASTINUM: No acute abnormality of the cardiac and mediastinal silhouettes. 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 IMPRESSION: 1. No acute process. Electronically signed by: Evalene Coho MD 12/23/2023 07:59 AM EDT RP Workstation: [id number]    Assessment and Plan MEHRAN GUDERIAN is a 78 y.o. 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   Patient was recently hospitalized for new onset of pancytopenia, when he was diagnosed with autoimmune hemolytic anemia.  Family reported that patient was started on high-dose of prednisone  80 mg daily about 1 week ago and since then patient has had very poor sleep at night and confused multiple times and once drove to [place] and night, and was found by police who called the family to pick him up from [facility] and patient obviously was confused and not even sure why he was here.  Acute metabolic encephalopathy suspect due to high dose of steroid possibility of cognitive decline -- patient appears hemodynamically stable. -- Mentation appears stable. Patient tells me he has become forgetful -- CT head negative for stroke  Hypokalemia -- improved after replacement  Recent diagnosis of autoimmune hemolytic anemia Pancytopenia -- patient currently is on 30 mg BID prednisone . -- Discussed with Dr. Babara recommends continue same dosing and will have Dr. Rennie see him tomorrow  Hypertension --cont lisinopril   PT/OT to see pt  Procedures: Family communication : attempted to call son times three--- left voicemail Consults : oncology CODE STATUS: full DVT Prophylaxis : SCD Level of care: Telemetry Medical Status is: Observation The patient remains OBS appropriate and will d/c before 2 midnights.    TOTAL TIME TAKING CARE OF THIS PATIENT: 35 minutes.  >50% time spent on counselling and coordination of care  Note: This dictation was prepared with Dragon dictation along with smaller phrase technology. Any transcriptional errors that result from this process are unintentional.  Leita Blanch M.D    [facility]   CC: Primary care physician; [facility]

## 2023-12-24 NOTE — Plan of Care (Signed)

## 2023-12-25 ENCOUNTER — Other Ambulatory Visit: Payer: Self-pay | Admitting: Internal Medicine

## 2023-12-25 ENCOUNTER — Inpatient Hospital Stay

## 2023-12-25 DIAGNOSIS — E876 Hypokalemia: Secondary | ICD-10-CM | POA: Diagnosis not present

## 2023-12-25 DIAGNOSIS — D61818 Other pancytopenia: Secondary | ICD-10-CM | POA: Diagnosis not present

## 2023-12-25 DIAGNOSIS — D5911 Warm autoimmune hemolytic anemia: Secondary | ICD-10-CM

## 2023-12-25 DIAGNOSIS — D591 Autoimmune hemolytic anemia, unspecified: Secondary | ICD-10-CM | POA: Diagnosis not present

## 2023-12-25 LAB — COMPREHENSIVE METABOLIC PANEL WITH GFR
ALT: 29 U/L (ref 0–44)
AST: 21 U/L (ref 15–41)
Albumin: 2.8 g/dL — ABNORMAL LOW (ref 3.5–5.0)
Alkaline Phosphatase: 84 U/L (ref 38–126)
Anion gap: 5 (ref 5–15)
BUN: 26 mg/dL — ABNORMAL HIGH (ref 8–23)
CO2: 26 mmol/L (ref 22–32)
Calcium: 9.4 mg/dL (ref 8.9–10.3)
Chloride: 104 mmol/L (ref 98–111)
Creatinine, Ser: 0.98 mg/dL (ref 0.61–1.24)
GFR, Estimated: >60 mL/min (ref >60)
Glucose, Bld: 115 mg/dL — ABNORMAL HIGH (ref 70–99)
Potassium: 4.3 mmol/L (ref 3.5–5.1)
Sodium: 135 mmol/L (ref 135–145)
Total Bilirubin: 1.8 mg/dL — ABNORMAL HIGH (ref 0.0–1.2)
Total Protein: 5.2 g/dL — ABNORMAL LOW (ref 6.5–8.1)

## 2023-12-25 LAB — CBC
HCT: 22.2 % — ABNORMAL LOW (ref 39.0–52.0)
Hemoglobin: 7.8 g/dL — ABNORMAL LOW (ref 13.0–17.0)
MCH: 36.8 pg — ABNORMAL HIGH (ref 26.0–34.0)
MCHC: 35.1 g/dL (ref 30.0–36.0)
MCV: 104.7 fL — ABNORMAL HIGH (ref 80.0–100.0)
Platelets: 97 K/uL — ABNORMAL LOW (ref 150–400)
RBC: 2.12 MIL/uL — ABNORMAL LOW (ref 4.22–5.81)
RDW: 16.7 % — ABNORMAL HIGH (ref 11.5–15.5)
WBC: 5.3 K/uL (ref 4.0–10.5)
nRBC: 0 % (ref 0.0–0.2)

## 2023-12-25 LAB — LACTATE DEHYDROGENASE: LDH: 178 U/L (ref 98–192)

## 2023-12-25 MED ORDER — HYDRALAZINE HCL 20 MG/ML IJ SOLN: 10.0000 mg | Freq: Four times a day (QID) | INTRAMUSCULAR | Status: DC | PRN

## 2023-12-25 MED ORDER — DIPHENHYDRAMINE HCL 25 MG PO CAPS
50.0000 mg | ORAL_CAPSULE | Freq: Once | ORAL | Status: AC
  Administered 2023-12-25: 50 mg via ORAL
  Filled 2023-12-25: qty 2

## 2023-12-25 MED ORDER — HEPARIN SOD (PORK) LOCK FLUSH 100 UNIT/ML IV SOLN: 500.0000 [IU] | Freq: Once | INTRAVENOUS | Status: DC | PRN

## 2023-12-25 MED ORDER — METHYLPREDNISOLONE SODIUM SUCC 125 MG IJ SOLR: 125.0000 mg | Freq: Once | INTRAMUSCULAR | Status: DC | PRN

## 2023-12-25 MED ORDER — HEPARIN SOD (PORK) LOCK FLUSH 100 UNIT/ML IV SOLN
250.0000 [IU] | Freq: Once | INTRAVENOUS | Status: DC | PRN
Start: 2023-12-25 — End: 2023-12-27

## 2023-12-25 MED ORDER — SODIUM CHLORIDE 0.9 % IV SOLN: Freq: Once | INTRAVENOUS | Status: DC | PRN

## 2023-12-25 MED ORDER — ACETAMINOPHEN 325 MG PO TABS
650.0000 mg | ORAL_TABLET | Freq: Once | ORAL | Status: AC
  Administered 2023-12-25: 650 mg via ORAL
  Filled 2023-12-25: qty 2

## 2023-12-25 MED ORDER — AMLODIPINE BESYLATE 10 MG PO TABS
10.0000 mg | ORAL_TABLET | Freq: Every day | ORAL | Status: DC
  Administered 2023-12-25 – 2023-12-26 (×2): 10 mg via ORAL
  Filled 2023-12-25 (×2): qty 1

## 2023-12-25 MED ORDER — POLYETHYLENE GLYCOL 3350 17 G PO PACK
17.0000 g | PACK | Freq: Two times a day (BID) | ORAL | Status: DC
  Administered 2023-12-25 – 2023-12-26 (×2): 17 g via ORAL
  Filled 2023-12-25 (×3): qty 1

## 2023-12-25 MED ORDER — SODIUM CHLORIDE 0.9% FLUSH
10.0000 mL | INTRAVENOUS | Status: DC | PRN
Start: 2023-12-25 — End: 2023-12-27

## 2023-12-25 MED ORDER — ALTEPLASE 2 MG IJ SOLR: 2.0000 mg | Freq: Once | INTRAMUSCULAR | Status: DC | PRN

## 2023-12-25 MED ORDER — DIPHENHYDRAMINE HCL 50 MG/ML IJ SOLN: 50.0000 mg | Freq: Once | INTRAMUSCULAR | Status: DC | PRN

## 2023-12-25 MED ORDER — SODIUM CHLORIDE 0.9% FLUSH: 3.0000 mL | INTRAVENOUS | Status: DC | PRN

## 2023-12-25 MED ORDER — DEXAMETHASONE SODIUM PHOSPHATE 10 MG/ML IJ SOLN
10.0000 mg | Freq: Once | INTRAMUSCULAR | Status: AC
  Administered 2023-12-25: 10 mg via INTRAVENOUS
  Filled 2023-12-25: qty 1

## 2023-12-25 MED ORDER — FAMOTIDINE IN NACL 20-0.9 MG/50ML-% IV SOLN
20.0000 mg | Freq: Once | INTRAVENOUS | Status: DC | PRN
Start: 2023-12-25 — End: 2023-12-27

## 2023-12-25 MED ORDER — RITUXIMAB-PVVR CHEMO 500 MG/50ML IV SOLN
375.0000 mg/m2 | Freq: Once | INTRAVENOUS | Status: AC
  Administered 2023-12-25: 800 mg via INTRAVENOUS
  Filled 2023-12-25: qty 80

## 2023-12-25 MED ORDER — ALBUTEROL SULFATE HFA 108 (90 BASE) MCG/ACT IN AERS: 2.0000 | INHALATION_SPRAY | Freq: Once | RESPIRATORY_TRACT | Status: DC | PRN

## 2023-12-25 MED ORDER — EPINEPHRINE 0.3 MG/0.3ML IJ SOAJ: 0.3000 mg | Freq: Once | INTRAMUSCULAR | Status: DC | PRN

## 2023-12-25 MED ORDER — SODIUM CHLORIDE 0.9 % IV SOLN: INTRAVENOUS | Status: DC

## 2023-12-25 NOTE — Consult Note (Signed)
  Cancer Center CONSULT NOTE  Patient Care Team: Center, [facility] as PCP - General (General Practice) [name], MD as Consulting Physician (Oncology)  CHIEF COMPLAINTS/PURPOSE OF CONSULTATION: Autoimmune hemolytic anemia severe anemia  Oncology History   No history exists.    HISTORY OF PRESENTING ILLNESS: Patient in wheelchair.  Alone/hard of hearing  Brian Montes 78 y.o.  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  Patient was recently admitted to hospital for severe anemia hemoglobin around 6 needing PRBC transfusion.  Patient also status post high-dose Solu-Medrol .  Patient was discharged home on prednisone  about 80 mg a day.  In the clinic patient was noted to have a hemoglobin of 9-however as per family he was having some issues with confusion.  Confusion was attributed to steroids.  Patient was recommended taper dose of steroids.  However patient is currently admitted.  Back to the hospital for similar complaints of fatigue and also episodes of confusion.  Patient complains of poor appetite.  Positive for dizziness.  Currently his mentation is back to baseline.  Review of Systems  Constitutional:  Positive for malaise/fatigue. Negative for chills, diaphoresis, fever and weight loss.  HENT:  Negative for nosebleeds and sore throat.   Eyes:  Negative for double vision.  Respiratory:  Positive for shortness of breath. Negative for cough, hemoptysis, sputum production and wheezing.   Cardiovascular:  Negative for chest pain, palpitations, orthopnea and leg swelling.  Gastrointestinal:  Negative for abdominal pain, blood in stool, constipation, diarrhea, heartburn, melena, nausea and vomiting.  Genitourinary:  Negative for dysuria, frequency and urgency.  Musculoskeletal:   Negative for back pain and joint pain.  Skin: Negative.  Negative for itching and rash.  Neurological:  Positive for dizziness. Negative for tingling, focal weakness, weakness and headaches.  Endo/Heme/Allergies:  Does not bruise/bleed easily.  Psychiatric/Behavioral:  Negative for depression. The patient is not nervous/anxious and does not have insomnia.     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 Bosie - NORMAL CORONARIES   CHOLECYSTECTOMY N/A 11/02/2017   Procedure: LAPAROSCOPIC CHOLECYSTECTOMY;  Surgeon: [name], MD;  Location: [facility] ORS;  Service: General;  Laterality: N/A;   RIGHT COLECTOMY  2009   at [facility]    SOCIAL HISTORY: Social History   Socioeconomic History   Marital status: Married    Spouse name: Not on file   Number of children: Not on file   Years of education: Not on file   Highest education level: Not on file  Occupational History   Not on file  Tobacco Use   Smoking status: Former   Smokeless tobacco: Current    Types: Chew  Vaping Use   Vaping status: Never Used  Substance and Sexual Activity   Alcohol use: Not Currently   Drug use: Never   Sexual activity: Not on file  Other Topics Concern   Not on file  Social History Narrative   Not on file   Social Drivers of Health   Financial Resource Strain: Not on file  Food Insecurity:  No Food Insecurity (12/23/2023)   Hunger Vital Sign    Worried About Running Out of Food in the Last Year: Never true    Ran Out of Food in the Last Year: Never true  Transportation Needs: No Transportation Needs (12/23/2023)   PRAPARE - Administrator, Civil Service (Medical): No    Lack of Transportation  (Non-Medical): No  Physical Activity: Not on file  Stress: Not on file  Social Connections: Unknown (12/23/2023)   Social Connection and Isolation Panel    Frequency of Communication with Friends and Family: More than three times a week    Frequency of Social Gatherings with Friends and Family: More than three times a week    Attends Religious Services: Patient declined    Database administrator or Organizations: Patient declined    Attends Banker Meetings: Patient declined    Marital Status: Patient declined  Intimate Partner Violence: Not At Risk (12/23/2023)   Humiliation, Afraid, Rape, and Kick questionnaire    Fear of Current or Ex-Partner: No    Emotionally Abused: No    Physically Abused: No    Sexually Abused: No    FAMILY HISTORY: Family History  Problem Relation Age of Onset   CAD Father    Diabetes Brother    Bladder Cancer Other     ALLERGIES:  is allergic to codeine.  MEDICATIONS:  Current Facility-Administered Medications  Medication Dose Route Frequency Provider Last Rate Last Admin   0.9 %  sodium chloride  infusion   Intravenous Continuous Toniya Rozar R, MD 10 mL/hr at 12/25/23 1502 Infusion Verify at 12/25/23 1502   0.9 %  sodium chloride  infusion   Intravenous Once PRN Lien Lyman R, MD       acetaminophen  (TYLENOL ) tablet 650 mg  650 mg Oral Q6H PRN Laurita Manor T, MD   650 mg at 12/24/23 9097   Or   acetaminophen  (TYLENOL ) suppository 650 mg  650 mg Rectal Q6H PRN Laurita Manor DASEN, MD       albuterol  (VENTOLIN  HFA) 108 (90 Base) MCG/ACT inhaler 2 puff  2 puff Inhalation Once PRN Dejae Bernet R, MD       alteplase  (CATHFLO ACTIVASE ) injection 2 mg  2 mg Intracatheter Once PRN Zuriel Yeaman R, MD       amitriptyline  (ELAVIL ) tablet 10 mg  10 mg Oral QHS Laurita Manor T, MD   10 mg at 12/25/23 2147   amLODipine  (NORVASC ) tablet 10 mg  10 mg Oral Daily Patel, Sona, MD   10 mg at 12/25/23 1640   aspirin  EC tablet 81 mg  81 mg  Oral Daily Zhang, Ping T, MD   81 mg at 12/25/23 0845   atorvastatin  (LIPITOR) tablet 40 mg  40 mg Oral QHS Laurita Manor T, MD   40 mg at 12/25/23 2146   diphenhydrAMINE  (BENADRYL ) injection 50 mg  50 mg Intravenous Once PRN Thayer Inabinet R, MD       EPINEPHrine  (EPI-PEN) injection 0.3 mg  0.3 mg Intramuscular Once PRN Maela Takeda R, MD       famotidine  (PEPCID ) IVPB 20 mg premix  20 mg Intravenous Once PRN Caden Fukushima R, MD       FLUoxetine  (PROZAC ) capsule 20 mg  20 mg Oral Daily Laurita Manor T, MD   20 mg at 12/25/23 0845   heparin  lock flush 100 unit/mL  500 Units Intracatheter Once PRN Walden Statz R, MD  heparin  lock flush 100 unit/mL  250 Units Intracatheter Once PRN Mikie Misner R, MD       hydrALAZINE  (APRESOLINE ) injection 10 mg  10 mg Intravenous Q6H PRN Patel, Sona, MD       lisinopril  (ZESTRIL ) tablet 20 mg  20 mg Oral Daily Zhang, Ping T, MD   20 mg at 12/25/23 0845   loratadine  (CLARITIN ) tablet 10 mg  10 mg Oral Daily Zhang, Ping T, MD   10 mg at 12/25/23 0845   melatonin tablet 5 mg  5 mg Oral QHS Laurita Manor T, MD   5 mg at 12/25/23 2147   methylPREDNISolone  sodium succinate (SOLU-MEDROL ) 125 mg/2 mL injection 125 mg  125 mg Intravenous Once PRN Jeter Tomey R, MD       ondansetron  (ZOFRAN ) tablet 4 mg  4 mg Oral Q6H PRN Laurita Manor T, MD       Or   ondansetron  (ZOFRAN ) injection 4 mg  4 mg Intravenous Q6H PRN Laurita Manor T, MD       pantoprazole  (PROTONIX ) EC tablet 40 mg  40 mg Oral Daily Zhang, Ping T, MD   40 mg at 12/25/23 0845   polyethylene glycol (MIRALAX  / GLYCOLAX ) packet 17 g  17 g Oral BID Patel, Sona, MD   17 g at 12/25/23 1449   predniSONE  (DELTASONE ) tablet 30 mg  30 mg Oral BID WC Laurita Manor T, MD   30 mg at 12/25/23 1640   senna-docusate (Senokot-S) tablet 1 tablet  1 tablet Oral QHS PRN Laurita Manor DASEN, MD   1 tablet at 12/24/23 2015   sodium chloride  flush (NS) 0.9 % injection 10 mL  10 mL Intracatheter PRN  Waino Mounsey R, MD       sodium chloride  flush (NS) 0.9 % injection 3 mL  3 mL Intracatheter PRN Xan Sparkman R, MD       traMADol  (ULTRAM ) tablet 50 mg  50 mg Oral Q6H PRN Patel, Sona, MD   50 mg at 12/25/23 2146   traZODone  (DESYREL ) tablet 100 mg  100 mg Oral QHS Laurita Manor T, MD   100 mg at 12/25/23 2147    PHYSICAL EXAMINATION:   Vitals:   12/25/23 1615 12/25/23 1946  BP: (!) 168/76 (!) 136/57  Pulse: 61 66  Resp: 18 16  Temp: 97.8 F (36.6 C) 98.1 F (36.7 C)  SpO2: 100% 100%   Filed Weights   12/23/23 1503  Weight: 188 lb 14.2 oz (85.7 kg)  Positive for jaundice.  Physical Exam Vitals and nursing note reviewed.  HENT:     Head: Normocephalic and atraumatic.     Mouth/Throat:     Pharynx: Oropharynx is clear.  Eyes:     Extraocular Movements: Extraocular movements intact.     Pupils: Pupils are equal, round, and reactive to light.  Cardiovascular:     Rate and Rhythm: Normal rate and regular rhythm.  Pulmonary:     Comments: Decreased breath sounds bilaterally.  Abdominal:     Palpations: Abdomen is soft.  Musculoskeletal:        General: Normal range of motion.     Cervical back: Normal range of motion.  Skin:    General: Skin is warm.  Neurological:     General: No focal deficit present.     Mental Status: He is alert and oriented to person, place, and time.  Psychiatric:        Behavior: Behavior normal.  Judgment: Judgment normal.     LABORATORY DATA:  I have reviewed the data as listed Lab Results  Component Value Date   WBC 5.3 12/25/2023   HGB 7.8 (L) 12/25/2023   HCT 22.2 (L) 12/25/2023   MCV 104.7 (H) 12/25/2023   PLT 97 (L) 12/25/2023   Recent Labs    12/12/23 1656 12/13/23 0635 12/23/23 0420 12/23/23 1542 12/24/23 0451 12/25/23 0937  NA  --    < > 133*  --  136 135  K  --    < > 2.7* 4.1 4.6 4.3  CL  --    < > 102  --  108 104  CO2  --    < > 23  --  25 26  GLUCOSE  --    < > 93  --  134* 115*  BUN  --     < > 18  --  19 26*  CREATININE  --    < > 1.14  --  1.20 0.98  CALCIUM   --    < > 8.9  --  8.5* 9.4  GFRNONAA  --    < > >60  --  >60 >60  PROT  --    < > 6.0*  --  5.2* 5.2*  ALBUMIN  --    < > 3.4*  --  2.7* 2.8*  AST  --    < > 35  --  26 21  ALT  --    < > 44  --  33 29  ALKPHOS  --    < > 84  --  85 84  BILITOT 4.1*   < > 4.5*  --  3.2* 1.8*  BILIDIR 0.9*  --   --   --   --   --   IBILI 3.2*  --   --   --   --   --    < > = values in this interval not displayed.    RADIOGRAPHIC STUDIES: I have personally reviewed the radiological images as listed and agreed with the findings in the report. CT Head Wo Contrast Result Date: 12/23/2023 CLINICAL DATA:  Pain and confusion. 3 day history of weakness. Pancytopenia. EXAM: CT HEAD WITHOUT CONTRAST TECHNIQUE: Contiguous axial images were obtained from the base of the skull through the vertex without intravenous contrast. RADIATION DOSE REDUCTION: This exam was performed according to the departmental dose-optimization program which includes automated exposure control, adjustment of the mA and/or kV according to patient size and/or use of iterative reconstruction technique. COMPARISON:  None Available. FINDINGS: Brain: There is no evidence for acute hemorrhage, hydrocephalus, mass lesion, or abnormal extra-axial fluid collection. No definite CT evidence for acute infarction. Diffuse loss of parenchymal volume is consistent with atrophy. Patchy low attenuation in the deep hemispheric and periventricular white matter is nonspecific, but likely reflects chronic microvascular ischemic demyelination. Vascular: No hyperdense vessel or unexpected calcification. Skull: No evidence for fracture. No worrisome lytic or sclerotic lesion. Sinuses/Orbits: Left mastoid effusion evident. Visualized paranasal sinuses are clear. Visualized portions of the globes and intraorbital fat are unremarkable. Other: None. IMPRESSION: 1. No acute intracranial abnormality. 2. Atrophy  with chronic small vessel ischemic disease. 3. Left mastoid effusion. Electronically Signed   By: Camellia Candle M.D.   On: 12/23/2023 08:09   DG Chest Port 1 View Result Date: 12/23/2023 EXAM: PA AND LATERAL (2 VIEWS) XRAY OF THE CHEST 12/23/2023 07:28:41 AM COMPARISON: PA and lateral radiographs of chest dated 12/12/2023.  CLINICAL HISTORY: 141835 Weakness 141835. Weakness. FINDINGS: LUNGS AND PLEURA: No focal pulmonary opacity. No pulmonary edema. No pleural effusion. No pneumothorax. HEART AND MEDIASTINUM: No acute abnormality of the cardiac and mediastinal silhouettes. 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 IMPRESSION: 1. No acute process. Electronically signed by: Evalene Coho MD 12/23/2023 07:59 AM EDT RP Workstation: [id number]   CT ABDOMEN PELVIS W CONTRAST Result Date: 12/12/2023 CLINICAL DATA:  Evaluation of metastatic disease. History of hypertension, hyperlipidemia, and abnormal labs. EXAM: CT ABDOMEN AND PELVIS WITH CONTRAST TECHNIQUE: Multidetector CT imaging of the abdomen and pelvis was performed using the standard protocol following bolus administration of intravenous contrast. RADIATION DOSE REDUCTION: This exam was performed according to the departmental dose-optimization program which includes automated exposure control, adjustment of the mA and/or kV according to patient size and/or use of iterative reconstruction technique. CONTRAST:  OMNIPAQUE  IOHEXOL  350 MG/ML SOLN COMPARISON:  02/21/2023 FINDINGS: Lower chest: Small bilateral pleural effusions with basilar atelectasis. Hepatobiliary: No focal liver lesions. Mild periportal edema may indicate infectious or inflammatory process such as hepatitis. Surgical absence of the gallbladder. No bile duct dilatation. Pancreas: Unremarkable. No pancreatic ductal dilatation or surrounding inflammatory changes. Spleen: Normal in size without focal abnormality.  Adrenals/Urinary Tract: Adrenal glands are unremarkable. Kidneys are normal, without renal calculi, focal lesion, or hydronephrosis. Bladder is unremarkable. Stomach/Bowel: Stomach, small bowel, and colon are not abnormally distended. Stool throughout the colon. Previous right colon resection with ileocolonic anastomosis. Appendix is surgically absent. Vascular/Lymphatic: Aortic atherosclerosis. No enlarged abdominal or pelvic lymph nodes. Reproductive: Prostate is unremarkable. Other: Small amount of free fluid in the abdomen around the liver and extending along the pericolic gutters. This may be reactive. No free air. Abdominal wall musculature appears intact. Musculoskeletal: Degenerative changes in the spine. Curvilinear calcifications in the superior femoral heads bilaterally consistent with avascular necrosis. IMPRESSION: 1. Small bilateral pleural effusions with basilar atelectasis. 2. Mild periportal edema in the liver. This is nonspecific but could indicate infectious or inflammatory process such as hepatitis. 3. Aortic atherosclerosis. 4. 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 Electronically Signed   By: Elsie Gravely M.D.   On: 12/12/2023 17:21   CT Angio Chest Pulmonary Embolism (PE) W or WO Contrast Result Date: 12/12/2023 CLINICAL DATA:  Pulmonary embolus suspected with high probability. Hypertension, hyperlipidemia. Abnormal lab work. EXAM: CT ANGIOGRAPHY CHEST WITH CONTRAST TECHNIQUE: Multidetector CT imaging of the chest was performed using the standard protocol during bolus administration of intravenous contrast. Multiplanar CT image reconstructions and MIPs were obtained to evaluate the vascular anatomy. RADIATION DOSE REDUCTION: This exam was performed according to the departmental dose-optimization program which includes automated exposure control, adjustment of the mA and/or kV according to patient size and/or use of iterative reconstruction technique. CONTRAST:   OMNIPAQUE  IOHEXOL  350 MG/ML SOLN COMPARISON:  Chest radiograph 12/12/2023 FINDINGS: Cardiovascular: Technically adequate study with good opacification of the central and segmental pulmonary arteries. Mild motion artifact. No focal filling defects. No evidence of significant pulmonary embolus. Normal heart size. No pericardial effusions. Calcification of the aorta and coronary arteries. No aneurysm. Mediastinum/Nodes: No enlarged mediastinal, hilar, or axillary lymph nodes. Thyroid gland, trachea, and esophagus demonstrate no significant findings. Lungs/Pleura: Small bilateral pleural effusions. Minimal atelectasis in the lung bases. No airspace disease or consolidation is suggested. No pneumothorax. Upper Abdomen: Surgical absence of the gallbladder. Small amount of free fluid around the liver possibly ascites. Musculoskeletal: No chest wall abnormality.  No acute or significant osseous findings. Review of the MIP images confirms the above findings. IMPRESSION: 1. No evidence of significant pulmonary embolus. 2. Small bilateral pleural effusions with minimal basilar atelectasis. 3. Small amount of free fluid noted around the liver suggesting ascites. Electronically Signed   By: Elsie Gravely M.D.   On: 12/12/2023 17:16   DG Chest 2 View Result Date: 12/12/2023 CLINICAL DATA:  dyspnea. EXAM: CHEST - 2 VIEW COMPARISON:  04/29/2019. FINDINGS: Low lung volume. Bilateral lung fields are clear. Bilateral costophrenic angles are clear. Stable cardio-mediastinal silhouette. No acute osseous abnormalities. The soft tissues are within normal limits. IMPRESSION: No active cardiopulmonary disease. Electronically Signed   By: Ree Molt M.D.   On: 12/12/2023 11:32     No problem-specific Assessment & Plan notes found for this encounter.  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  Recommend proceeding with rituximab  infusion.  # Generalized confusion-no focal deficits suspect secondary to steroid induced.  Currently back to baseline.  #I discussed with the patient and his son Curtistine over the phone regarding mechanism of action of rituximab ;and also potential side effects including but not limited to infusion reactions;rare infections/activation including hepatitis. hepatitis work-up-negative.  # Thank you Dr. Tobie. for allowing me to participate in the care of your pleasant patient. Please do not hesitate to contact me with questions or concerns in the interim.  Discussed with Dr. Tobie.  If patient is clinically stable-patient will be discharged home tomorrow on prednisone  60 mg a day.  Patient will be followed closely outpatient basis.   # 60 minutes face-to-face with the patient discussing the above plan of care; more than 50% of time spent on prognosis/ natural history; counseling and coordination.     Cindy JONELLE Joe, MD 12/25/2023 10:27 PM

## 2023-12-25 NOTE — Care Management Obs Status (Signed)
 MEDICARE OBSERVATION STATUS NOTIFICATION   Patient Details  Name: Brian Montes MRN: [id number] Date of Birth: [birth year]   Medicare Observation Status Notification Given:  Yes    Rojelio SHAUNNA Rattler 12/25/2023, 10:09 AM

## 2023-12-25 NOTE — Progress Notes (Signed)
 [facility]  -  at [facility]   PATIENT NAME: Brian Montes    MR#:  [id number]  DATE OF BIRTH:  [birth year]  SUBJECTIVE:  no family at bedside. Spoke with son Brian Montes on the phone and discussed the plan.. Patient sitting out in the recliner. Hard of hearing.  Complains of back pain. Not really know why he is here in the hospital. Answered most questions appropriately    VITALS:  Blood pressure (!) 146/74, pulse (!) 59, temperature 97.7 F (36.5 C), temperature source Oral, resp. rate 18, height 5' 9 (1.753 m), weight 85.7 kg, SpO2 99%.  PHYSICAL EXAMINATION:   GENERAL:  78 y.o.-year-old patient with no acute distress.  LUNGS: Normal breath sounds bilaterally, no wheezing CARDIOVASCULAR: S1, S2 normal. No murmur   ABDOMEN: Soft, nontender, nondistended. Bowel sounds present.  EXTREMITIES: No  edema b/l.    NEUROLOGIC: nonfocal  patient is alert and awake   LABORATORY PANEL:  CBC Recent Labs  Lab 12/25/23 0937  WBC 5.3  HGB 7.8*  HCT 22.2*  PLT 97*    Chemistries  Recent Labs  Lab 12/23/23 0740 12/23/23 1542 12/25/23 0937  NA  --    < > 135  K  --    < > 4.3  CL  --    < > 104  CO2  --    < > 26  GLUCOSE  --    < > 115*  BUN  --    < > 26*  CREATININE  --    < > 0.98  CALCIUM   --    < > 9.4  MG 1.7  --   --   AST  --    < > 21  ALT  --    < > 29  ALKPHOS  --    < > 84  BILITOT  --    < > 1.8*   < > = values in this interval not displayed.     Assessment and Plan Brian Montes is a 78 y.o. 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   Patient was recently hospitalized for new onset of pancytopenia, when he was diagnosed with autoimmune hemolytic anemia.  Family reported that patient was started on high-dose of prednisone  80 mg daily about 1 week ago and since then patient has had very poor sleep  at night and confused multiple times and once drove to [place] and night, and was found by police who called the family to pick him up from [facility] and patient obviously was confused and not even sure why he was here.  Acute metabolic encephalopathy suspect due to high dose of steroid -- patient appears hemodynamically stable. -- Mentation appears stable. Patient tells me he has become forgetful -- CT head negative for stroke  Hypokalemia -- improved after replacement  Recent diagnosis of autoimmune hemolytic anemia Pancytopenia -- patient currently is on 30 mg BID prednisone . -- Discussed with Dr. Babara recommends continue same dosing and will have Dr. Rennie see him tomorrow --9/8-- Dr. Rennie recommends patient be started on IV rituximab  infusion for his AIHA  Hypertension --cont lisinopril   PT/OT --HHPT  Procedures: Family communication :son Brian Montes Consults : oncology CODE STATUS: full DVT Prophylaxis : SCD Level of care: Telemetry Medical Status is: Observation The patient remains OBS appropriate and will d/c before 2 midnights.    TOTAL TIME TAKING CARE  OF THIS PATIENT: 35 minutes.  >50% time spent on counselling and coordination of care  Note: This dictation was prepared with Dragon dictation along with smaller phrase technology. Any transcriptional errors that result from this process are unintentional.  Leita Blanch M.D    [facility]   CC: Primary care physician; Center, [facility]

## 2023-12-25 NOTE — Plan of Care (Signed)

## 2023-12-25 NOTE — Evaluation (Signed)
 Occupational Therapy Evaluation Patient Details Name: Brian Montes MRN: [id number] DOB: Sep 18, 1945 Today's Date: 12/25/2023   History of Present Illness   Pt is a 78 y.o. male presented with generalized weakness and recommendations. Admitted for management of Acute metabolic encephalopathy and severe hypokalemia. 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 PTA, pt resides in a one level home alone with 3 STE. At baseline, he is MOD I with ADLs and ambulation using a walking stick/cane more recently at son's request. He reports multiple falls in the last several months, mostly slipping in the shower. He does not drive and reports his son takes him groceries and manages his medications.  Pt presents with deficits in strength, balance and cognition/safety awareness, affecting safe and optimal ADL completion. Pt currently requires MOD I for bed mobility and supervision for STS from EOB without device. He was able to take a few steps and get walking stick off the counter with SBA then required CGA at all times while ambulating to the bathroom and back d/t staggering noted intermittently once on more slick bathroom floor. He demo toilet transfer with CGA/SBA and hygiene with MOD I. Pt performed standing hand hygiene with SBA and returned to recliner. He is noted to grasp for walls and at times not have walking stick on the ground for any support. He is alert, but only oriented to person and place. Reports he will be discharging to his son's home, but was unable to provide this home set up information. He would not be safe to return home alone d/t his AMS, however with intermittent supervision from family he would be more successful. Pt would benefit from skilled OT services to address noted impairments and functional limitations to maximize safety and independence while minimizing future risk  of falls, injury, and readmission. Do anticipate the need for follow up OT services upon acute hospital DC.      If plan is discharge home, recommend the following:   A little help with walking and/or transfers;A little help with bathing/dressing/bathroom;Assistance with cooking/housework;Assist for transportation;Help with stairs or ramp for entrance;Supervision due to cognitive status;Direct supervision/assist for financial management;Direct supervision/assist for medications management     Functional Status Assessment   Patient has had a recent decline in their functional status and demonstrates the ability to make significant improvements in function in a reasonable and predictable amount of time.     Equipment Recommendations   None recommended by OT     Recommendations for Other Services         Precautions/Restrictions   Precautions Precautions: Fall Recall of Precautions/Restrictions: Impaired Restrictions Weight Bearing Restrictions Per Provider Order: No     Mobility Bed Mobility Overal bed mobility: Modified Independent             General bed mobility comments: no physical assist needed    Transfers Overall transfer level: Needs assistance Equipment used: None, Straight cane Transfers: Sit to/from Stand Sit to Stand: Supervision           General transfer comment: pt stood from EOB without AD use and no warning to therapist, however only needed supervision and was able to walk a few steps to get his cane off the counter      Balance Overall balance assessment: Needs assistance Sitting-balance support: Feet supported Sitting balance-Leahy Scale: Good     Standing balance support: Single extremity supported  Standing balance-Leahy Scale: Good Standing balance comment: utilized a Recruitment consultant with CGA, however noted with occasional staggering, reaching for walls and not using cane at times                           ADL  either performed or assessed with clinical judgement   ADL Overall ADL's : Needs assistance/impaired                         Toilet Transfer: Neurosurgeon Details (indicate cue type and reason): utilized his personal cane/walking stick to ambulate to the bathroom and back with occasional staggering in gait and at times picking up cane and not even using it, but would reach out for the walls Toileting- Clothing Manipulation and Hygiene: Supervision/safety;Sitting/lateral lean       Functional mobility during ADLs: Contact guard assist;Cane       Vision         Perception         Praxis         Pertinent Vitals/Pain Pain Assessment Pain Assessment: 0-10 Pain Score: 5  Pain Location: back-chronic pain Pain Descriptors / Indicators: Aching, Sore Pain Intervention(s): Monitored during session, Limited activity within patient's tolerance, Repositioned     Extremity/Trunk Assessment Upper Extremity Assessment Upper Extremity Assessment: Overall WFL for tasks assessed   Lower Extremity Assessment Lower Extremity Assessment: Generalized weakness;Defer to PT evaluation       Communication Communication Factors Affecting Communication: Hearing impaired   Cognition Arousal: Alert Behavior During Therapy: WFL for tasks assessed/performed, Impulsive               OT - Cognition Comments: oriented to person and place; unable to report time or year; easily distracted and fidgety                 Following commands: Impaired Following commands impaired: Follows one step commands with increased time     Cueing  General Comments   Cueing Techniques: Verbal cues      Exercises Other Exercises Other Exercises: Edu on role of OT in acute setting   Shoulder Instructions      Home Living Family/patient expects to be discharged to:: Private residence Living Arrangements: Alone Available Help  at Discharge: Family;Available PRN/intermittently (son) Type of Home: Mobile home Home Access: Stairs to enter Entrance Stairs-Number of Steps: 3 STE Entrance Stairs-Rails: Left;Right;Can reach both Home Layout: One level     Bathroom Shower/Tub: Producer, television/film/video: Standard Bathroom Accessibility: Yes How Accessible: Accessible via walker Home Equipment: Grab bars - tub/shower;Hand held shower head;Shower seat - built Charity fundraiser (2 wheels);Cane - single point   Additional Comments: pt reports he will be moving in with his son when he leaves here      Prior Functioning/Environment Prior Level of Function : Independent/Modified Independent             Mobility Comments: reports no AD use, but started using SPC about a week ago; mult falls last couple of months reports slipping in the shower a lot ADLs Comments: IND/MOD I; son brings him groceries since he does not drive anymore    OT Problem List: Decreased strength;Impaired balance (sitting and/or standing);Decreased activity tolerance;Decreased safety awareness;Pain   OT Treatment/Interventions: Self-care/ADL training;Therapeutic exercise;Balance training;Therapeutic activities;DME and/or AE instruction;Patient/family education      OT Goals(Current goals can be found in the  care plan section)   Acute Rehab OT Goals Patient Stated Goal: improve function OT Goal Formulation: With patient Time For Goal Achievement: 01/08/24 Potential to Achieve Goals: Good ADL Goals Pt Will Perform Lower Body Bathing: with modified independence;sitting/lateral leans;sit to/from stand Pt Will Perform Lower Body Dressing: with modified independence;sit to/from stand;sitting/lateral leans Pt Will Transfer to Toilet: with modified independence;ambulating Additional ADL Goal #1: Pt will demo implementation of 1 leared falls prevention strategy during ADL performance/mobility to maximize sfaety/IND.   OT Frequency:  Min  2X/week    Co-evaluation              AM-PAC OT 6 Clicks Daily Activity     Outcome Measure Help from another person eating meals?: None Help from another person taking care of personal grooming?: None Help from another person toileting, which includes using toliet, bedpan, or urinal?: A Little Help from another person bathing (including washing, rinsing, drying)?: A Little Help from another person to put on and taking off regular upper body clothing?: None Help from another person to put on and taking off regular lower body clothing?: A Little 6 Click Score: 21   End of Session Equipment Utilized During Treatment: Gait belt (cane/walking stick) Nurse Communication: Mobility status  Activity Tolerance: Patient tolerated treatment well Patient left: in chair;with call bell/phone within reach;with chair alarm set  OT Visit Diagnosis: Other abnormalities of gait and mobility (R26.89);Unsteadiness on feet (R26.81)                Time: 9179-9147 OT Time Calculation (min): 32 min Charges:  OT General Charges $OT Visit: 1 Visit OT Evaluation $OT Eval Low Complexity: 1 Low OT Treatments $Self Care/Home Management : 8-22 mins Eliana Lueth, OTR/L 12/25/23, 10:37 AM  Duwaine FORBES Saupe 12/25/2023, 10:32 AM

## 2023-12-25 NOTE — Progress Notes (Signed)
START OFF PATHWAY REGIMEN - Other   [id number]:Rituximab IV/SUBQ D1 q7 Days:   Cycle 1: A cycle is 7 days:     Rituximab-xxxx    Cycles 2 and beyond: A cycle is every 7 days:     Rituximab and hyaluronidase human   **Always confirm dose/schedule in your pharmacy ordering system**  Patient Characteristics: Intent of Therapy: Non-Curative / Palliative Intent, Discussed with Patient

## 2023-12-25 NOTE — Evaluation (Signed)
 Physical Therapy Evaluation Patient Details Name: Brian Montes MRN: [id number] DOB: Jun 13, 1945 Today's Date: 12/25/2023  History of Present Illness  Pt is a 78 y.o. male presented with generalized weakness with MD assessment including acute metabolic encephalopathy and severe hypokalemia. 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   Clinical Impression  Pt was pleasant and mildly confused during the session. Pt was motivated to participate during the session and put forth good effort throughout. Pt required no physical assistance during the session but did require occasional cuing for safe sequencing with the RW during ambulation.  Pt somewhat impulsive during gait training with min to mod verbal and tactile cues to keep hands on the RW while walking and to ambulate within the RW during sharp turns; pt presented with mild drifting left/right during ambulation but no overt LOB. Pt will benefit from continued PT services upon discharge to safely address deficits listed in patient problem list for decreased caregiver assistance and eventual return to PLOF.           If plan is discharge home, recommend the following: A little help with walking and/or transfers;A little help with bathing/dressing/bathroom;Assistance with cooking/housework;Direct supervision/assist for medications management;Supervision due to cognitive status;Assist for transportation;Help with stairs or ramp for entrance   Can travel by private vehicle        Equipment Recommendations None recommended by PT  Recommendations for Other Services       Functional Status Assessment Patient has had a recent decline in their functional status and demonstrates the ability to make significant improvements in function in a reasonable and predictable amount of time.     Precautions / Restrictions Precautions Precautions: Fall Recall of Precautions/Restrictions:  Impaired Restrictions Weight Bearing Restrictions Per Provider Order: No      Mobility  Bed Mobility               General bed mobility comments: NT, pt in recliner pre/post session, was Mod Ind with OT earlier this date    Transfers Overall transfer level: Needs assistance Equipment used: Rolling walker (2 wheels) Transfers: Sit to/from Stand Sit to Stand: Supervision           General transfer comment: Min verbal cues for hand placement with pt able to demonstrate good eccentric and concentric control and stability    Ambulation/Gait Ambulation/Gait assistance: Contact guard assist Gait Distance (Feet): 300 Feet Assistive device: Rolling walker (2 wheels) Gait Pattern/deviations: Step-through pattern, Decreased step length - right, Decreased step length - left, Drifts right/left Gait velocity: WFL     General Gait Details: Pt somewhat impulsive during gait training with min to mod verbal and tactile cues to keep hands on the RW while walking and to ambulate within the RW during sharp turns; pt presented with mild drifting left/right during ambulation but no overt LOB  Stairs            Wheelchair Mobility     Tilt Bed    Modified Rankin (Stroke Patients Only)       Balance Overall balance assessment: Needs assistance Sitting-balance support: Feet supported Sitting balance-Leahy Scale: Good     Standing balance support: Bilateral upper extremity supported, During functional activity Standing balance-Leahy Scale: Fair                               Pertinent Vitals/Pain Pain Assessment Pain Assessment: 0-10 Pain Score: 7  Pain Location: back-chronic pain Pain Descriptors / Indicators: Aching, Sore Pain Intervention(s): Repositioned, Monitored during session, Other (comment) (pt declined pain meds)    Home Living Family/patient expects to be discharged to:: Private residence Living Arrangements: Alone Available Help at  Discharge: Family;Available PRN/intermittently Type of Home: Mobile home Home Access: Stairs to enter Entrance Stairs-Rails: Left;Right;Can reach both Entrance Stairs-Number of Steps: 3 STE   Home Layout: One level Home Equipment: Grab bars - tub/shower;Hand held shower head;Shower seat - built Charity fundraiser (2 wheels);Cane - single point Additional Comments: Pt reports he will be moving in with his son when he leaves here who lives in a double wide with several steps to enter and B hand rails    Prior Function Prior Level of Function : Independent/Modified Independent;History of Falls (last six months)             Mobility Comments: Ind amb community distances without an AD at baseline but with recent use of SPC for stability; multiple falls in the last 6 months secondary to LOB/slipping ADLs Comments: Ind with ADLs, son brings him groceries, pt no longer drives     Extremity/Trunk Assessment   Upper Extremity Assessment Upper Extremity Assessment: Overall WFL for tasks assessed    Lower Extremity Assessment Lower Extremity Assessment: Generalized weakness       Communication   Communication Factors Affecting Communication: Hearing impaired    Cognition Arousal: Alert Behavior During Therapy: WFL for tasks assessed/performed, Impulsive   PT - Cognitive impairments: No family/caregiver present to determine baseline                         Following commands: Impaired Following commands impaired: Follows one step commands with increased time     Cueing Cueing Techniques: Verbal cues, Visual cues     General Comments      Exercises     Assessment/Plan    PT Assessment Patient needs continued PT services  PT Problem List Decreased strength;Decreased activity tolerance;Decreased balance;Decreased mobility;Decreased knowledge of use of DME;Decreased safety awareness       PT Treatment Interventions DME instruction;Gait training;Stair  training;Functional mobility training;Therapeutic activities;Therapeutic exercise;Balance training;Patient/family education    PT Goals (Current goals can be found in the Care Plan section)  Acute Rehab PT Goals Patient Stated Goal: To walk more PT Goal Formulation: With patient Time For Goal Achievement: 01/07/24 Potential to Achieve Goals: Good    Frequency Min 2X/week     Co-evaluation               AM-PAC PT 6 Clicks Mobility  Outcome Measure Help needed turning from your back to your side while in a flat bed without using bedrails?: A Little Help needed moving from lying on your back to sitting on the side of a flat bed without using bedrails?: A Little Help needed moving to and from a bed to a chair (including a wheelchair)?: A Little Help needed standing up from a chair using your arms (e.g., wheelchair or bedside chair)?: A Little Help needed to walk in hospital room?: A Little Help needed climbing 3-5 steps with a railing? : A Little 6 Click Score: 18    End of Session Equipment Utilized During Treatment: Gait belt Activity Tolerance: Patient tolerated treatment well Patient left: in chair;with call bell/phone within reach;with chair alarm set Nurse Communication: Mobility status PT Visit Diagnosis: Unsteadiness on feet (R26.81);History of falling (Z91.81);Difficulty in walking, not elsewhere classified (R26.2);Muscle weakness (generalized) (M62.81)  Time: 9056-8997 PT Time Calculation (min) (ACUTE ONLY): 19 min   Charges:   PT Evaluation $PT Eval Moderate Complexity: 1 Mod   PT General Charges $$ ACUTE PT VISIT: 1 Visit       D. Scott Tudor Chandley PT, DPT 12/25/23, 11:10 AM

## 2023-12-26 ENCOUNTER — Other Ambulatory Visit: Payer: Self-pay | Admitting: Internal Medicine

## 2023-12-26 DIAGNOSIS — E876 Hypokalemia: Secondary | ICD-10-CM | POA: Diagnosis not present

## 2023-12-26 DIAGNOSIS — D5911 Warm autoimmune hemolytic anemia: Secondary | ICD-10-CM

## 2023-12-26 LAB — RETICULOCYTES
Immature Retic Fract: 5.8 % (ref 2.3–15.9)
RBC.: 2.03 MIL/uL — ABNORMAL LOW (ref 4.22–5.81)
Retic Count, Absolute: 81.6 K/uL (ref 19.0–186.0)
Retic Ct Pct: 4 % — ABNORMAL HIGH (ref 0.4–3.1)

## 2023-12-26 LAB — CBC WITH DIFFERENTIAL/PLATELET
Abs Immature Granulocytes: 0.06 K/uL (ref 0.00–0.07)
Basophils Absolute: 0 K/uL (ref 0.0–0.1)
Basophils Relative: 0 %
Eosinophils Absolute: 0 K/uL (ref 0.0–0.5)
Eosinophils Relative: 0 %
HCT: 21.8 % — ABNORMAL LOW (ref 39.0–52.0)
Hemoglobin: 7.5 g/dL — ABNORMAL LOW (ref 13.0–17.0)
Immature Granulocytes: 1 %
Lymphocytes Relative: 7 %
Lymphs Abs: 0.3 K/uL — ABNORMAL LOW (ref 0.7–4.0)
MCH: 36.6 pg — ABNORMAL HIGH (ref 26.0–34.0)
MCHC: 34.4 g/dL (ref 30.0–36.0)
MCV: 106.3 fL — ABNORMAL HIGH (ref 80.0–100.0)
Monocytes Absolute: 0.2 K/uL (ref 0.1–1.0)
Monocytes Relative: 4 %
Neutro Abs: 3.9 K/uL (ref 1.7–7.7)
Neutrophils Relative %: 88 %
Platelets: 102 K/uL — ABNORMAL LOW (ref 150–400)
RBC: 2.05 MIL/uL — ABNORMAL LOW (ref 4.22–5.81)
RDW: 16.9 % — ABNORMAL HIGH (ref 11.5–15.5)
WBC: 4.5 K/uL (ref 4.0–10.5)
nRBC: 0 % (ref 0.0–0.2)

## 2023-12-26 LAB — PREPARE RBC (CROSSMATCH)

## 2023-12-26 LAB — LACTATE DEHYDROGENASE: LDH: 221 U/L — ABNORMAL HIGH (ref 98–192)

## 2023-12-26 LAB — BILIRUBIN, FRACTIONATED(TOT/DIR/INDIR)
Bilirubin, Direct: 0.5 mg/dL — ABNORMAL HIGH (ref 0.0–0.2)
Indirect Bilirubin: 0.9 mg/dL (ref 0.3–0.9)
Total Bilirubin: 1.4 mg/dL — ABNORMAL HIGH (ref 0.0–1.2)

## 2023-12-26 LAB — HEMOGLOBIN AND HEMATOCRIT, BLOOD
HCT: 27 % — ABNORMAL LOW (ref 39.0–52.0)
Hemoglobin: 9.3 g/dL — ABNORMAL LOW (ref 13.0–17.0)

## 2023-12-26 MED ORDER — SODIUM CHLORIDE 0.9% IV SOLUTION: Freq: Once | INTRAVENOUS | Status: DC

## 2023-12-26 MED ORDER — AMLODIPINE BESYLATE 10 MG PO TABS: 10.0000 mg | ORAL_TABLET | Freq: Every day | ORAL | 1 refills | Status: AC

## 2023-12-26 MED ORDER — PREDNISONE 20 MG PO TABS: 60.0000 mg | ORAL_TABLET | Freq: Every day | ORAL | 1 refills | Status: DC

## 2023-12-26 MED ORDER — BISACODYL 10 MG RE SUPP
10.0000 mg | Freq: Every day | RECTAL | Status: DC
  Administered 2023-12-26: 10 mg via RECTAL
  Filled 2023-12-26: qty 1

## 2023-12-26 MED ORDER — ALBUTEROL SULFATE HFA 108 (90 BASE) MCG/ACT IN AERS: 2.0000 | INHALATION_SPRAY | Freq: Once | RESPIRATORY_TRACT | 0 refills | Status: DC | PRN

## 2023-12-26 NOTE — Progress Notes (Signed)
   12/26/23 1600  Mobility  Activity Ambulated with assistance;Respositioned in chair  Level of Assistance Contact guard assist, steadying assist  Assistive Device Front wheel walker  Distance Ambulated (ft) 405 ft  Range of Motion/Exercises Active Assistive  Activity Response Tolerated well  Mobility Referral Yes  Mobility visit 1 Mobility  Mobility Specialist Start Time (ACUTE ONLY) 0302  Mobility Specialist Stop Time (ACUTE ONLY) 0320  Mobility Specialist Time Calculation (min) (ACUTE ONLY) 18 min   Mobility Specialist - Progress Note Pt was in the chair upon entry. Pt was excited about mobility activity. Pt ambulated well throughout mobility. Control of 2WW will needs more reps. Upon return to the room after mobility pt was able to independently use the bathroom, with contact guard. Pt after activity did describe past issues with his feet but states he is able to manage. Pt returned to the chair with needs in reach.  Clem Rodes Mobility Specialist 12/26/23, 4:08 PM

## 2023-12-26 NOTE — Plan of Care (Signed)

## 2023-12-26 NOTE — Progress Notes (Signed)
 Physical Therapy Treatment Patient Details Name: Brian Montes MRN: [id number] DOB: April 22, 1945 Today's Date: 12/26/2023   History of Present Illness Pt is a 78 y.o. male presented with generalized weakness with MD assessment including acute metabolic encephalopathy and severe hypokalemia. 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    PT Comments  Pt was pleasant and motivated to participate during the session and put forth good effort throughout. Pt required no physical assistance during the session and was generally steady with standing activities with no overt LOB.  Pt remained somewhat impulsive during the session most notably during gait training with cues needed to keep both hands on the RW and to stay within the RW during sharp turns.  Pt demonstrated good eccentric and concentric control and stability during stair training using alternating pattern and one rail.  Pt reported no adverse symptoms during the session with SpO2 and HR WNL throughout on room air. Pt will benefit from continued PT services upon discharge to safely address deficits listed in patient problem list for decreased caregiver assistance and eventual return to PLOF.       If plan is discharge home, recommend the following: A little help with walking and/or transfers;A little help with bathing/dressing/bathroom;Assistance with cooking/housework;Direct supervision/assist for medications management;Supervision due to cognitive status;Assist for transportation;Help with stairs or ramp for entrance   Can travel by private vehicle        Equipment Recommendations  None recommended by PT    Recommendations for Other Services       Precautions / Restrictions Precautions Precautions: Fall Recall of Precautions/Restrictions: Impaired Restrictions Weight Bearing Restrictions Per Provider Order: No     Mobility  Bed Mobility               General bed  mobility comments: NT, pt in recliner pre/post session    Transfers Overall transfer level: Needs assistance Equipment used: Rolling walker (2 wheels) Transfers: Sit to/from Stand Sit to Stand: Supervision           General transfer comment: Very good eccentric and concentric control and stability    Ambulation/Gait Ambulation/Gait assistance: Contact guard assist Gait Distance (Feet): 300 Feet Assistive device: Rolling walker (2 wheels) Gait Pattern/deviations: Step-through pattern, Decreased step length - right, Decreased step length - left, Drifts right/left Gait velocity: WFL     General Gait Details: Remained impulsive with cues needed to keep hands on RW and stay within the RW during sharp turns   Stairs Stairs: Yes Stairs assistance: Contact guard assist Stair Management: One rail Right, Alternating pattern, Forwards Number of Stairs: 4 General stair comments: Pt able to ascend and descend 4 steps with one rail with very good eccentric and concentric control and stability   Wheelchair Mobility     Tilt Bed    Modified Rankin (Stroke Patients Only)       Balance Overall balance assessment: Needs assistance Sitting-balance support: Feet supported Sitting balance-Leahy Scale: Good     Standing balance support: Bilateral upper extremity supported, During functional activity Standing balance-Leahy Scale: Fair                              Musician Factors Affecting Communication: Hearing impaired  Cognition Arousal: Alert Behavior During Therapy: WFL for tasks assessed/performed, Impulsive   PT - Cognitive impairments: No family/caregiver present to determine baseline  Following commands: Impaired Following commands impaired: Follows one step commands with increased time    Cueing Cueing Techniques: Verbal cues, Visual cues  Exercises Other Exercises Other Exercises: Static standing  unsupported balance training with feet apart, together, and semi tandem with combinations of EO/EC and head still/head turns    General Comments        Pertinent Vitals/Pain Pain Assessment Pain Assessment: No/denies pain    Home Living                          Prior Function            PT Goals (current goals can now be found in the care plan section) Progress towards PT goals: Progressing toward goals    Frequency    Min 2X/week      PT Plan      Co-evaluation              AM-PAC PT 6 Clicks Mobility   Outcome Measure  Help needed turning from your back to your side while in a flat bed without using bedrails?: A Little Help needed moving from lying on your back to sitting on the side of a flat bed without using bedrails?: A Little Help needed moving to and from a bed to a chair (including a wheelchair)?: A Little Help needed standing up from a chair using your arms (e.g., wheelchair or bedside chair)?: A Little Help needed to walk in hospital room?: A Little Help needed climbing 3-5 steps with a railing? : A Little 6 Click Score: 18    End of Session Equipment Utilized During Treatment: Gait belt Activity Tolerance: Patient tolerated treatment well Patient left: in chair;with call bell/phone within reach;with chair alarm set Nurse Communication: Mobility status PT Visit Diagnosis: Unsteadiness on feet (R26.81);History of falling (Z91.81);Difficulty in walking, not elsewhere classified (R26.2);Muscle weakness (generalized) (M62.81)     Time: 9055-8992 PT Time Calculation (min) (ACUTE ONLY): 23 min  Charges:    $Gait Training: 8-22 mins $Therapeutic Exercise: 8-22 mins PT General Charges $$ ACUTE PT VISIT: 1 Visit                    D. Scott Winnie Barsky PT, DPT 12/26/23, 10:39 AM

## 2023-12-26 NOTE — Progress Notes (Signed)
 Curtistine Bare, Pt's son is here to pick up pt. Pt discharged home.

## 2023-12-26 NOTE — Progress Notes (Signed)
 Also signed for 9/18[facility]- you can give this appt when he comes for visit on 9/12-patient is currently hospitalized.  He is hard of hearing easily confused.  GB

## 2023-12-26 NOTE — Progress Notes (Signed)
 Brian Montes   DOB:02-18-1946   FM#:[id number]    Subjective: Patient sitting in the chair.  Feels improved since admission.  Denies any shortness of breath.  He is able to move his bowels well.  Patient status post rituximab  infusion yesterday.  Tolerated well.  No infusion reactions.  Objective:  Vitals:   12/26/23 0840 12/26/23 1250  BP: (!) 148/78 128/73  Pulse: 68 68  Resp: 18 18  Temp: 97.6 F (36.4 C) 98.1 F (36.7 C)  SpO2: 100% 100%     Intake/Output Summary (Last 24 hours) at 12/26/2023 1420 Last data filed at 12/26/2023 1041 Gross per 24 hour  Intake 425.9 ml  Output --  Net 425.9 ml    Physical Exam Vitals and nursing note reviewed.  HENT:     Head: Normocephalic and atraumatic.     Mouth/Throat:     Pharynx: Oropharynx is clear.  Eyes:     Extraocular Movements: Extraocular movements intact.     Pupils: Pupils are equal, round, and reactive to light.  Cardiovascular:     Rate and Rhythm: Normal rate and regular rhythm.  Pulmonary:     Comments: Decreased breath sounds bilaterally.  Abdominal:     Palpations: Abdomen is soft.  Musculoskeletal:        General: Normal range of motion.     Cervical back: Normal range of motion.  Skin:    General: Skin is warm.  Neurological:     General: No focal deficit present.     Mental Status: He is alert and oriented to person, place, and time.  Psychiatric:        Behavior: Behavior normal.        Judgment: Judgment normal.      Labs:  Lab Results  Component Value Date   WBC 4.5 12/26/2023   HGB 7.5 (L) 12/26/2023   HCT 21.8 (L) 12/26/2023   MCV 106.3 (H) 12/26/2023   PLT 102 (L) 12/26/2023   NEUTROABS 3.9 12/26/2023    Lab Results  Component Value Date   NA 135 12/25/2023   K 4.3 12/25/2023   CL 104 12/25/2023   CO2 26 12/25/2023    Studies:  No results found.  No problem-specific Assessment & Plan notes found for this encounter.  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  Patient status post rituximab  infusion yesterday.  Tolerated well.  No infusion reactions.  Today hemoglobin is 7.5.  Proceed with 1 L PRBC.  Discharge home with prednisone  60 mg a day.  Hemolysis seems to be improving.   # Generalized confusion-no focal deficits suspect secondary to steroid induced.  Currently back to baseline.   # Recommend patient follow-up in the clinic as planned on 9/12 with APP at 1;15.  Discussed with Dr. Tobie.  # Will plan outpatient rituximab  next week or so.    Cindy JONELLE Joe, MD 12/26/2023  2:20 PM

## 2023-12-26 NOTE — Discharge Summary (Signed)
 Physician Discharge Summary   Patient: Brian Montes MRN: [id number] DOB: December 01, 1945  Admit date:     12/23/2023  Discharge date: 12/26/23  Discharge Physician: Leita Blanch   PCP: [facility]   Recommendations at discharge:   follow-up Dr. Rennie on Friday 9/12 at 1:15 pm F/u PCP in 1-2 weeks keep log of blood pressure at home and review with PCP  Discharge Diagnoses: Principal Problem:   Hypokalemia Active Problems:   Acute encephalopathy  Brian Montes is a 78 y.o. 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   Patient was recently hospitalized for new onset of pancytopenia, when he was diagnosed with autoimmune hemolytic anemia.  Family reported that patient was started on high-dose of prednisone  80 mg daily about 1 week ago and since then patient has had very poor sleep at night and confused multiple times and once drove to [place] and night, and was found by police who called the family to pick him up from [facility] and patient obviously was confused and not even sure why he was here.   Acute metabolic encephalopathy suspect due to high dose of steroid -- patient appears hemodynamically stable. -- Mentation appears stable. Patient tells me he has become forgetful and HOH -- CT head negative for stroke   Hypokalemia -- improved after replacement   Recent diagnosis of autoimmune hemolytic anemia Pancytopenia -- patient currently is on prednisone  60 mg qd -- Discussed with Dr. Babara recommends continue same dosing and will have Dr. Rennie see him tomorrow --9/8-- Dr. Rennie recommends patient be started on IV rituximab  infusion for his AIHA --9/9--hgb down to 7.5-- Dr. Rennie recommends one unit blood transfusion will transfuse it and discharge to home with outpatient follow-up this Friday at the cancer  center. Continue 60 mg daily prednisone    Hypertension --cont amlodipine  (not on nay meds at home per med rec)   PT/OT --HHPT   Procedures: Family communication :son [name] on the phone Consults : oncology CODE STATUS: full DVT Prophylaxis : SCD        Disposition: Home health Diet recommendation:  Cardiac diet DISCHARGE MEDICATION: Allergies as of 12/26/2023       Reactions   Codeine Other (See Comments)        Medication List     TAKE these medications    albuterol  108 (90 Base) MCG/ACT inhaler Commonly known as: VENTOLIN  HFA Inhale 2 puffs into the lungs once as needed for wheezing or shortness of breath (FOR GRADE 3, or 4 HYPERSENSITIVITY REACTIONS).   amitriptyline  10 MG tablet Commonly known as: ELAVIL  Take 10 mg by mouth at bedtime.   amLODipine  10 MG tablet Commonly known as: NORVASC  Take 1 tablet (10 mg total) by mouth daily. Start taking on: December 27, 2023   aspirin  EC 81 MG tablet Take 81 mg by mouth daily.   atorvastatin  40 MG tablet Commonly known as: LIPITOR Take 40 mg by mouth at bedtime.   Calcium  Carb-Cholecalciferol 600-10 MG-MCG Tabs Take 1 tablet by mouth 2 (two) times daily.   FLUoxetine  20 MG capsule Commonly known as: PROZAC  Take 20 mg by mouth daily.   folic acid  1 MG tablet Commonly known as: FOLVITE  Take 1 tablet (1 mg total) by mouth daily.   Iron 325 (65 Fe) MG Tabs Take 1 tablet by mouth 2 (two) times daily.   loratadine  10 MG tablet Commonly known as:  CLARITIN  Take 10 mg by mouth daily.   omeprazole 20 MG capsule Commonly known as: PRILOSEC Take 20 mg by mouth daily.   predniSONE  20 MG tablet Commonly known as: DELTASONE  Take 3 tablets (60 mg total) by mouth daily with breakfast.   traZODone  100 MG tablet Commonly known as: DESYREL  Take 100 mg by mouth at bedtime.        Follow-up Information     [facility] Go on 01/02/2024.   Specialty: General Practice Why: Please call ofice  to schedule your follow up appointment. Contact information: [address] [place] [address] [phone]         [name], MD. Go on 12/29/2023.   Specialties: Internal Medicine, Oncology Why: at 1:15 pm Contact information: [address] [place] [address] [phone]                Discharge Exam: Fredricka Weights   12/23/23 1503  Weight: 85.7 kg   GENERAL:  78 y.o.-year-old patient with no acute distress. HOH LUNGS: Normal breath sounds bilaterally, no wheezing CARDIOVASCULAR: S1, S2 normal. No murmur   ABDOMEN: Soft, nontender, nondistended. Bowel sounds present.  EXTREMITIES: No  edema b/l.    NEUROLOGIC: nonfocal  patient is alert and awake  Condition at discharge: fair  The results of significant diagnostics from this hospitalization (including imaging, microbiology, ancillary and laboratory) are listed below for reference.   Imaging Studies: CT Head Wo Contrast Result Date: 12/23/2023 CLINICAL DATA:  Pain and confusion. 3 day history of weakness. Pancytopenia. EXAM: CT HEAD WITHOUT CONTRAST TECHNIQUE: Contiguous axial images were obtained from the base of the skull through the vertex without intravenous contrast. RADIATION DOSE REDUCTION: This exam was performed according to the departmental dose-optimization program which includes automated exposure control, adjustment of the mA and/or kV according to patient size and/or use of iterative reconstruction technique. COMPARISON:  None Available. FINDINGS: Brain: There is no evidence for acute hemorrhage, hydrocephalus, mass lesion, or abnormal extra-axial fluid collection. No definite CT evidence for acute infarction. Diffuse loss of parenchymal volume is consistent with atrophy. Patchy low attenuation in the deep hemispheric and periventricular white matter is nonspecific, but likely reflects chronic microvascular ischemic demyelination. Vascular: No hyperdense vessel or unexpected  calcification. Skull: No evidence for fracture. No worrisome lytic or sclerotic lesion. Sinuses/Orbits: Left mastoid effusion evident. Visualized paranasal sinuses are clear. Visualized portions of the globes and intraorbital fat are unremarkable. Other: None. IMPRESSION: 1. No acute intracranial abnormality. 2. Atrophy with chronic small vessel ischemic disease. 3. Left mastoid effusion. Electronically Signed   By: Camellia Candle M.D.   On: 12/23/2023 08:09   DG Chest Port 1 View Result Date: 12/23/2023 EXAM: PA AND LATERAL (2 VIEWS) XRAY OF THE CHEST 12/23/2023 07:28:41 AM COMPARISON: PA and lateral radiographs of chest dated 12/12/2023. CLINICAL HISTORY: 141835 Weakness 141835. Weakness. FINDINGS: LUNGS AND PLEURA: No focal pulmonary opacity. No pulmonary edema. No pleural effusion. No pneumothorax. HEART AND MEDIASTINUM: No acute abnormality of the cardiac and mediastinal silhouettes. 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 IMPRESSION: 1. No acute process. Electronically signed by: Evalene Coho MD 12/23/2023 07:59 AM EDT RP Workstation: [id number]   CT ABDOMEN PELVIS W CONTRAST Result Date: 12/12/2023 CLINICAL DATA:  Evaluation of metastatic disease. History of hypertension, hyperlipidemia, and abnormal labs. EXAM: CT ABDOMEN AND PELVIS WITH CONTRAST TECHNIQUE: Multidetector CT imaging of the abdomen and pelvis was performed using  the standard protocol following bolus administration of intravenous contrast. RADIATION DOSE REDUCTION: This exam was performed according to the departmental dose-optimization program which includes automated exposure control, adjustment of the mA and/or kV according to patient size and/or use of iterative reconstruction technique. CONTRAST:  OMNIPAQUE  IOHEXOL  350 MG/ML SOLN COMPARISON:  02/21/2023 FINDINGS: Lower chest: Small bilateral pleural effusions with basilar atelectasis. Hepatobiliary: No  focal liver lesions. Mild periportal edema may indicate infectious or inflammatory process such as hepatitis. Surgical absence of the gallbladder. No bile duct dilatation. Pancreas: Unremarkable. No pancreatic ductal dilatation or surrounding inflammatory changes. Spleen: Normal in size without focal abnormality. Adrenals/Urinary Tract: Adrenal glands are unremarkable. Kidneys are normal, without renal calculi, focal lesion, or hydronephrosis. Bladder is unremarkable. Stomach/Bowel: Stomach, small bowel, and colon are not abnormally distended. Stool throughout the colon. Previous right colon resection with ileocolonic anastomosis. Appendix is surgically absent. Vascular/Lymphatic: Aortic atherosclerosis. No enlarged abdominal or pelvic lymph nodes. Reproductive: Prostate is unremarkable. Other: Small amount of free fluid in the abdomen around the liver and extending along the pericolic gutters. This may be reactive. No free air. Abdominal wall musculature appears intact. Musculoskeletal: Degenerative changes in the spine. Curvilinear calcifications in the superior femoral heads bilaterally consistent with avascular necrosis. IMPRESSION: 1. Small bilateral pleural effusions with basilar atelectasis. 2. Mild periportal edema in the liver. This is nonspecific but could indicate infectious or inflammatory process such as hepatitis. 3. Aortic atherosclerosis. 4. 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 Electronically Signed   By: Elsie Gravely M.D.   On: 12/12/2023 17:21   CT Angio Chest Pulmonary Embolism (PE) W or WO Contrast Result Date: 12/12/2023 CLINICAL DATA:  Pulmonary embolus suspected with high probability. Hypertension, hyperlipidemia. Abnormal lab work. EXAM: CT ANGIOGRAPHY CHEST WITH CONTRAST TECHNIQUE: Multidetector CT imaging of the chest was performed using the standard protocol during bolus administration of intravenous contrast. Multiplanar CT image reconstructions and MIPs were  obtained to evaluate the vascular anatomy. RADIATION DOSE REDUCTION: This exam was performed according to the departmental dose-optimization program which includes automated exposure control, adjustment of the mA and/or kV according to patient size and/or use of iterative reconstruction technique. CONTRAST:  OMNIPAQUE  IOHEXOL  350 MG/ML SOLN COMPARISON:  Chest radiograph 12/12/2023 FINDINGS: Cardiovascular: Technically adequate study with good opacification of the central and segmental pulmonary arteries. Mild motion artifact. No focal filling defects. No evidence of significant pulmonary embolus. Normal heart size. No pericardial effusions. Calcification of the aorta and coronary arteries. No aneurysm. Mediastinum/Nodes: No enlarged mediastinal, hilar, or axillary lymph nodes. Thyroid gland, trachea, and esophagus demonstrate no significant findings. Lungs/Pleura: Small bilateral pleural effusions. Minimal atelectasis in the lung bases. No airspace disease or consolidation is suggested. No pneumothorax. Upper Abdomen: Surgical absence of the gallbladder. Small amount of free fluid around the liver possibly ascites. Musculoskeletal: No chest wall abnormality. No acute or significant osseous findings. Review of the MIP images confirms the above findings. IMPRESSION: 1. No evidence of significant pulmonary embolus. 2. Small bilateral pleural effusions with minimal basilar atelectasis. 3. Small amount of free fluid noted around the liver suggesting ascites. Electronically Signed   By: Elsie Gravely M.D.   On: 12/12/2023 17:16   DG Chest 2 View Result Date: 12/12/2023 CLINICAL DATA:  dyspnea. EXAM: CHEST - 2 VIEW COMPARISON:  04/29/2019. FINDINGS: Low lung volume. Bilateral lung fields are clear. Bilateral costophrenic angles are clear. Stable cardio-mediastinal silhouette. No acute osseous abnormalities. The soft tissues are within normal limits. IMPRESSION: No active cardiopulmonary disease. Electronically  Signed   By: Ree Molt M.D.   On: 12/12/2023 11:32    Microbiology: Results for orders placed or performed during the hospital encounter of 12/23/23  Resp panel by RT-PCR (RSV, Flu A&B, Covid) Anterior Nasal Swab     Status: None   Collection Time: 12/23/23  7:40 AM   Specimen: Anterior Nasal Swab  Result Value Ref Range Status   SARS Coronavirus 2 by RT PCR NEGATIVE NEGATIVE Final    Comment: (NOTE) SARS-CoV-2 target nucleic acids are NOT DETECTED.  The SARS-CoV-2 RNA is generally detectable in upper respiratory specimens during the acute phase of infection. The lowest concentration of SARS-CoV-2 viral copies this assay can detect is 138 copies/mL. A negative result does not preclude SARS-Cov-2 infection and should not be used as the sole basis for treatment or other patient management decisions. A negative result may occur with  improper specimen collection/handling, submission of specimen other than nasopharyngeal swab, presence of viral mutation(s) within the areas targeted by this assay, and inadequate number of viral copies(<138 copies/mL). A negative result must be combined with clinical observations, patient history, and epidemiological information. The expected result is Negative.  Fact Sheet for Patients:  BloggerCourse.com  Fact Sheet for Healthcare Providers:  SeriousBroker.it  This test is no t yet approved or cleared by the [place]  FDA and  has been authorized for detection and/or diagnosis of SARS-CoV-2 by FDA under an Emergency Use Authorization (EUA). This EUA will remain  in effect (meaning this test can be used) for the duration of the COVID-19 declaration under Section 564(b)(1) of the Act, 21 U.S.C.section 360bbb-3(b)(1), unless the authorization is terminated  or revoked sooner.       Influenza A by PCR NEGATIVE NEGATIVE Final   Influenza B by PCR NEGATIVE NEGATIVE Final    Comment:  (NOTE) The Xpert Xpress SARS-CoV-2/FLU/RSV plus assay is intended as an aid in the diagnosis of influenza from Nasopharyngeal swab specimens and should not be used as a sole basis for treatment. Nasal washings and aspirates are unacceptable for Xpert Xpress SARS-CoV-2/FLU/RSV testing.  Fact Sheet for Patients: BloggerCourse.com  Fact Sheet for Healthcare Providers: SeriousBroker.it  This test is not yet approved or cleared by the [place]  FDA and has been authorized for detection and/or diagnosis of SARS-CoV-2 by FDA under an Emergency Use Authorization (EUA). This EUA will remain in effect (meaning this test can be used) for the duration of the COVID-19 declaration under Section 564(b)(1) of the Act, 21 U.S.C. section 360bbb-3(b)(1), unless the authorization is terminated or revoked.     Resp Syncytial Virus by PCR NEGATIVE NEGATIVE Final    Comment: (NOTE) Fact Sheet for Patients: BloggerCourse.com  Fact Sheet for Healthcare Providers: SeriousBroker.it  This test is not yet approved or cleared by the [place]  FDA and has been authorized for detection and/or diagnosis of SARS-CoV-2 by FDA under an Emergency Use Authorization (EUA). This EUA will remain in effect (meaning this test can be used) for the duration of the COVID-19 declaration under Section 564(b)(1) of the Act, 21 U.S.C. section 360bbb-3(b)(1), unless the authorization is terminated or revoked.  Performed at [facility], [address] [place], [place] [address]     Labs: CBC: Recent Labs  Lab 12/22/23 [phone] 12/23/23 0420 12/24/23 0451 12/25/23 0937 12/26/23 0800  WBC 5.0 3.7* 2.1* 5.3 4.5  NEUTROABS 4.1  --   --   --  3.9  HGB 9.5* 9.2* 7.9* 7.8* 7.5*  HCT 27.2* 27.1* 23.6* 22.2*  21.8*  MCV 105.4* 107.5* 109.3* 104.7* 106.3*  PLT 132* 112* 84* 97* 102*   Basic Metabolic  Panel: Recent Labs  Lab 12/22/23 0851 12/23/23 0420 12/23/23 0740 12/23/23 1542 12/24/23 0451 12/25/23 0937  NA 135 133*  --   --  136 135  K 3.4* 2.7*  --  4.1 4.6 4.3  CL 103 102  --   --  108 104  CO2 22 23  --   --  25 26  GLUCOSE 115* 93  --   --  134* 115*  BUN 27* 18  --   --  19 26*  CREATININE 1.26* 1.14  --   --  1.20 0.98  CALCIUM  9.5 8.9  --   --  8.5* 9.4  MG  --   --  1.7  --   --   --    Liver Function Tests: Recent Labs  Lab 12/22/23 0851 12/23/23 0420 12/24/23 0451 12/25/23 0937 12/26/23 0800  AST 37 35 26 21  --   ALT 52* 44 33 29  --   ALKPHOS 95 84 85 84  --   BILITOT 4.2* 4.5* 3.2* 1.8* 1.4*  PROT 6.3* 6.0* 5.2* 5.2*  --   ALBUMIN 3.6 3.4* 2.7* 2.8*  --     Discharge time spent: greater than 30 minutes.  Signed: Leita Blanch, MD [facility] 12/26/2023

## 2023-12-26 NOTE — Discharge Instructions (Signed)
 [facility] They will call you to set up when they are coming out to see you   [address], [place], [place] [address] Hours:  Open ? Closes 5?PM Phone: [phone]        Instructions after Total Knee Replacement   Reinaldo Berber M.D.     Dept. of Orthopaedics & Sports Medicine  [facility]  [address]  [place], [place]  [address]  Phone: [phone]   Fax: [phone]    DIET: Drink plenty of non-alcoholic fluids. Resume your normal diet. Include foods high in fiber.  ACTIVITY:  You may use crutches or a walker with weight-bearing as tolerated, unless instructed otherwise. You may be weaned off of the walker or crutches by your Physical Therapist.  Do NOT place pillows under the knee. Anything placed under the knee could limit your ability to straighten the knee.   Continue doing gentle exercises. Exercising will reduce the pain and swelling, increase motion, and prevent muscle weakness.   Please continue to use the TED compression stockings for 2 weeks. You may remove the stockings at night, but should reapply them in the morning. Do not drive or operate any equipment until instructed.  WOUND CARE:  Continue to use the PolarCare or ice packs periodically to reduce pain and swelling. You may begin showering 3 days after surgery with honeycomb dressing. Remove honeycomb dressing 7 days after surgery and continue showering. Allow dermabond to fall off on its own.  MEDICATIONS: You may resume your regular medications. Please take the pain medication as prescribed on the medication. Do not take pain medication on an empty stomach. You have been given a prescription for a blood thinner (Lovenox or Coumadin). Please take the medication as instructed. (NOTE: After completing a 2 week course of Lovenox, take one 81 mg Enteric-coated aspirin twice a day for 3 additional weeks. This along with elevation will help reduce the possibility of phlebitis in your operated  leg.) Do not drive or drink alcoholic beverages when taking pain medications.  POSTOPERATIVE CONSTIPATION PROTOCOL Constipation - defined medically as fewer than three stools per week and severe constipation as less than one stool per week.  One of the most common issues patients have following surgery is constipation.  Even if you have a regular bowel pattern at home, your normal regimen is likely to be disrupted due to multiple reasons following surgery.  Combination of anesthesia, postoperative narcotics, change in appetite and fluid intake all can affect your bowels.  In order to avoid complications following surgery, here are some recommendations in order to help you during your recovery period.  Colace (docusate) - Pick up an over-the-counter form of Colace or another stool softener and take twice a day as long as you are requiring postoperative pain medications.  Take with a full glass of water daily.  If you experience loose stools or diarrhea, hold the colace until you stool forms back up.  If your symptoms do not get better within 1 week or if they get worse, check with your doctor.  Dulcolax (bisacodyl) - Pick up over-the-counter and take as directed by the product packaging as needed to assist with the movement of your bowels.  Take with a full glass of water.  Use this product as needed if not relieved by Colace only.   MiraLax (polyethylene glycol) - Pick up over-the-counter to have on hand.  MiraLax is a solution that will increase the amount of water in your bowels to assist with  bowel movements.  Take as directed and can mix with a glass of water, juice, soda, coffee, or tea.  Take if you go more than two days without a movement. Do not use MiraLax more than once per day. Call your doctor if you are still constipated or irregular after using this medication for 7 days in a row.  If you continue to have problems with postoperative constipation, please contact the office for further  assistance and recommendations.  If you experience "the worst abdominal pain ever" or develop nausea or vomiting, please contact the office immediatly for further recommendations for treatment.   CALL THE OFFICE FOR: Temperature above 101 degrees Excessive bleeding or drainage on the dressing. Excessive swelling, coldness, or paleness of the toes. Persistent nausea and vomiting.  FOLLOW-UP:  You should have an appointment to return to the office in 14 days after surgery. Arrangements have been made for continuation of Physical Therapy (either home therapy or outpatient therapy).

## 2023-12-26 NOTE — TOC Initial Note (Signed)
 Transition of Care [facility]) - Initial/Assessment Note    Patient Details  Name: Brian Montes MRN: [id number] Date of Birth: 05/14/1945  Transition [facility] Care [facility]) CM/SW Contact:    Brian ONEIDA Haddock, RN Phone Number: 12/26/2023, 10:55 AM  Clinical Narrative:                  Admitted qnm:fzujanopr encephalopathy  Admitted from: Home alone.  Will be discharging to son Brian Montes's address [address], [place], [place] [address] Current home health/prior home health/DME: RW, shower seat  Therapy recommending home health.  Met with patient at bedside.  He is in agreement and states he does not have a preference of agency.  Referral made to Shaun with [facility].  He is aware that patient will be discharging to sons address          Patient Goals and CMS Choice            Expected Discharge Plan and Services                                              Prior Living Arrangements/Services                       Activities of Daily Living   ADL Screening (condition at time of admission) Independently performs ADLs?: No Does the patient have a NEW difficulty with bathing/dressing/toileting/self-feeding that is expected to last >3 days?: Yes (Initiates electronic notice to provider for possible OT consult) Does the patient have a NEW difficulty with getting in/out of bed, walking, or climbing stairs that is expected to last >3 days?: Yes (Initiates electronic notice to provider for possible PT consult) Does the patient have a NEW difficulty with communication that is expected to last >3 days?: No Is the patient deaf or have difficulty hearing?: Yes Does the patient have difficulty seeing, even when wearing glasses/contacts?: No Does the patient have difficulty concentrating, remembering, or making decisions?: Yes  Permission Sought/Granted                  Emotional Assessment              Admission diagnosis:  Hypokalemia  [E87.6] Altered mental status, unspecified altered mental status type [R41.82] Patient Active Problem List   Diagnosis Date Noted   Hypokalemia 12/23/2023   Acute encephalopathy 12/23/2023   Hemolytic anemia (HCC) 12/22/2023   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:   [phone] [place],  - [address] [place] [address] Phone: [phone] Fax: [phone]     Social Drivers of Health (SDOH) Social History: SDOH Screenings   Food Insecurity: No Food Insecurity (12/23/2023)  Housing: Low Risk  (12/23/2023)  Transportation Needs: No Transportation Needs (12/23/2023)  Utilities: Not At Risk (12/23/2023)  Depression (PHQ2-9): Low Risk  (12/22/2023)  Social Connections: Unknown (12/23/2023)  Tobacco Use: High Risk (12/23/2023)   SDOH Interventions:     Readmission Risk Interventions     No data to display

## 2023-12-26 NOTE — Progress Notes (Signed)
 Mobility Specialist Progress Note:    12/26/23 1335  Mobility  Activity Ambulated with assistance  Level of Assistance Contact guard assist, steadying assist  Assistive Device Front wheel walker  Distance Ambulated (ft) 500 ft  Range of Motion/Exercises Active;All extremities  Activity Response Tolerated well  Mobility visit 1 Mobility  Mobility Specialist Start Time (ACUTE ONLY) 1314  Mobility Specialist Stop Time (ACUTE ONLY) 1335  Mobility Specialist Time Calculation (min) (ACUTE ONLY) 21 min   Pt eager for mobility. Required CGA to stand and ambulate with RW. Tolerated well, asx throughout. Returned to chair, alarm on and belongings in reach. All needs met.  Sherrilee Ditty Mobility Specialist Please contact via Special educational needs teacher or  Rehab office at [phone]

## 2023-12-27 LAB — TYPE AND SCREEN
ABO/RH(D): A POS
Antibody Screen: NEGATIVE
Unit division: 0
Unit division: 0
Unit division: 0

## 2023-12-27 LAB — BPAM RBC
Blood Product Expiration Date: 202509262359
Blood Product Expiration Date: 202510022359
Blood Product Expiration Date: 202510022359
ISSUE DATE / TIME: 202509091534
Unit Type and Rh: 5100
Unit Type and Rh: 6200
Unit Type and Rh: 6200

## 2023-12-28 ENCOUNTER — Encounter: Payer: Self-pay | Admitting: Internal Medicine

## 2023-12-29 ENCOUNTER — Other Ambulatory Visit

## 2023-12-29 ENCOUNTER — Ambulatory Visit: Admitting: Nurse Practitioner

## 2023-12-29 ENCOUNTER — Inpatient Hospital Stay

## 2023-12-29 ENCOUNTER — Encounter: Payer: Self-pay | Admitting: Nurse Practitioner

## 2023-12-29 ENCOUNTER — Other Ambulatory Visit: Payer: Self-pay | Admitting: Internal Medicine

## 2023-12-29 ENCOUNTER — Inpatient Hospital Stay: Admitting: Nurse Practitioner

## 2023-12-29 DIAGNOSIS — D5911 Warm autoimmune hemolytic anemia: Secondary | ICD-10-CM | POA: Diagnosis not present

## 2023-12-29 DIAGNOSIS — D649 Anemia, unspecified: Secondary | ICD-10-CM

## 2023-12-29 DIAGNOSIS — Z5112 Encounter for antineoplastic immunotherapy: Secondary | ICD-10-CM | POA: Diagnosis not present

## 2023-12-29 LAB — CBC WITH DIFFERENTIAL (CANCER CENTER ONLY)
Abs Immature Granulocytes: 0.12 K/uL — ABNORMAL HIGH (ref 0.00–0.07)
Basophils Absolute: 0 K/uL (ref 0.0–0.1)
Basophils Relative: 0 %
Eosinophils Absolute: 0 K/uL (ref 0.0–0.5)
Eosinophils Relative: 0 %
HCT: 30.7 % — ABNORMAL LOW (ref 39.0–52.0)
Hemoglobin: 10.7 g/dL — ABNORMAL LOW (ref 13.0–17.0)
Immature Granulocytes: 2 %
Lymphocytes Relative: 4 %
Lymphs Abs: 0.3 K/uL — ABNORMAL LOW (ref 0.7–4.0)
MCH: 34.5 pg — ABNORMAL HIGH (ref 26.0–34.0)
MCHC: 34.9 g/dL (ref 30.0–36.0)
MCV: 99 fL (ref 80.0–100.0)
Monocytes Absolute: 0.1 K/uL (ref 0.1–1.0)
Monocytes Relative: 2 %
Neutro Abs: 5.6 K/uL (ref 1.7–7.7)
Neutrophils Relative %: 92 %
Platelet Count: 101 K/uL — ABNORMAL LOW (ref 150–400)
RBC: 3.1 MIL/uL — ABNORMAL LOW (ref 4.22–5.81)
RDW: 18.4 % — ABNORMAL HIGH (ref 11.5–15.5)
WBC Count: 6.1 K/uL (ref 4.0–10.5)
nRBC: 0 % (ref 0.0–0.2)

## 2023-12-29 LAB — SAMPLE TO BLOOD BANK

## 2023-12-29 LAB — CMP (CANCER CENTER ONLY)
ALT: 41 U/L (ref 0–44)
AST: 34 U/L (ref 15–41)
Albumin: 3.6 g/dL (ref 3.5–5.0)
Alkaline Phosphatase: 161 U/L — ABNORMAL HIGH (ref 38–126)
Anion gap: 6 (ref 5–15)
BUN: 30 mg/dL — ABNORMAL HIGH (ref 8–23)
CO2: 23 mmol/L (ref 22–32)
Calcium: 9.5 mg/dL (ref 8.9–10.3)
Chloride: 105 mmol/L (ref 98–111)
Creatinine: 1.04 mg/dL (ref 0.61–1.24)
GFR, Estimated: >60 mL/min (ref >60)
Glucose, Bld: 127 mg/dL — ABNORMAL HIGH (ref 70–99)
Potassium: 3.9 mmol/L (ref 3.5–5.1)
Sodium: 134 mmol/L — ABNORMAL LOW (ref 135–145)
Total Bilirubin: 3.6 mg/dL (ref 0.0–1.2)
Total Protein: 6.1 g/dL — ABNORMAL LOW (ref 6.5–8.1)

## 2023-12-29 LAB — LACTATE DEHYDROGENASE: LDH: 207 U/L — ABNORMAL HIGH (ref 98–192)

## 2023-12-29 MED ORDER — PREDNISONE 20 MG PO TABS: 40.0000 mg | ORAL_TABLET | Freq: Every day | ORAL | Status: DC

## 2023-12-29 NOTE — Progress Notes (Signed)
 Critical lab called by [facility] in lab ;Total Bili 3.6. Readback. Tinnie Dawn notified.

## 2023-12-29 NOTE — Progress Notes (Signed)
 [name] CONSULT NOTE  Patient Care Team: Center, [name] as PCP - General (General Practice) [name], MD as Consulting Physician (Oncology)  CHIEF COMPLAINTS/PURPOSE OF CONSULTATION: Hemolytic anemia  Oncology History   No history exists.    HISTORY OF PRESENTING ILLNESS: Patient ambulating independently. Accompanied by son  Brian Montes 78 y.o.  male pleasant patient with hx of hyponatremia, NASH-recently admitted to the hospital for acute onset of autoimmune hemolytic anemia.  He continues prednisone  60 g daily.  Continues to feel confused and requires frequent reorientation from son. 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  Now living with his son at his son's house.  Chronic back pain which is not any worse.  No chest pain.  Appetite is stable.   Review of Systems  Reason unable to perform ROS: ROS provided by son.  Constitutional:  Positive for malaise/fatigue. Negative for chills, diaphoresis, fever and weight loss.  HENT:  Negative for nosebleeds and sore throat.   Eyes:  Negative for double vision.  Respiratory:  Negative for cough, hemoptysis, sputum production, shortness of breath and wheezing.   Cardiovascular:  Negative for chest pain, palpitations, orthopnea and leg swelling.  Gastrointestinal:  Negative for abdominal pain, blood in stool, constipation, diarrhea, heartburn, melena, nausea and vomiting.  Genitourinary:  Negative for dysuria, frequency and urgency.  Musculoskeletal:  Positive for back pain and joint pain.  Skin:  Negative for itching and rash.  Neurological:  Negative for dizziness, tingling, focal weakness, weakness and headaches.  Endo/Heme/Allergies:  Does not bruise/bleed easily.  Psychiatric/Behavioral:  Positive for memory loss. Negative for depression. The patient is not nervous/anxious and does not have insomnia.    MEDICAL HISTORY:  Past Medical History:  Diagnosis Date   Acid reflux    Calculus of  gallbladder with acute and chronic cholecystitis without obstruction    Essential hypertension    Hyperlipidemia due to dietary fat intake    SURGICAL HISTORY: Past Surgical History:  Procedure Laterality Date   CARDIAC CATHETERIZATION  2010   Dr. Bosie - NORMAL CORONARIES   CHOLECYSTECTOMY N/A 11/02/2017   Procedure: LAPAROSCOPIC CHOLECYSTECTOMY;  Surgeon: [name], MD;  Location: [facility] ORS;  Service: General;  Laterality: N/A;   RIGHT COLECTOMY  2009   at [facility]   SOCIAL HISTORY: Social History   Socioeconomic History   Marital status: Married    Spouse name: Not on file   Number of children: Not on file   Years of education: Not on file   Highest education level: Not on file  Occupational History   Not on file  Tobacco Use   Smoking status: Former   Smokeless tobacco: Current    Types: Chew  Vaping Use   Vaping status: Never Used  Substance and Sexual Activity   Alcohol use: Not Currently   Drug use: Never   Sexual activity: Not on file  Other Topics Concern   Not on file  Social History Narrative   Not on file   Social Drivers of Health   Financial Resource Strain: Not on file  Food Insecurity: No Food Insecurity (12/23/2023)   Hunger Vital Sign    Worried About Running Out of Food in the Last Year: Never true    Ran Out of Food in the Last Year: Never true  Transportation Needs: No Transportation Needs (12/23/2023)   PRAPARE - Administrator, Civil Service (Medical): No    Lack of Transportation (Non-Medical): No  Physical  Activity: Not on file  Stress: Not on file  Social Connections: Unknown (12/23/2023)   Social Connection and Isolation Panel    Frequency of Communication with Friends and Family: More than three times a week    Frequency of Social Gatherings with Friends and Family: More than three times a week    Attends Religious Services: Patient declined    Database administrator or Organizations: Patient declined    Attends Tax inspector Meetings: Patient declined    Marital Status: Patient declined  Intimate Partner Violence: Not At Risk (12/23/2023)   Humiliation, Afraid, Rape, and Kick questionnaire    Fear of Current or Ex-Partner: No    Emotionally Abused: No    Physically Abused: No    Sexually Abused: No   FAMILY HISTORY: Family History  Problem Relation Age of Onset   CAD Father    Diabetes Brother    Bladder Cancer Other    ALLERGIES:  is allergic to codeine.  MEDICATIONS:  Current Outpatient Medications  Medication Sig Dispense Refill   albuterol  (VENTOLIN  HFA) 108 (90 Base) MCG/ACT inhaler Inhale 2 puffs into the lungs once as needed for wheezing or shortness of breath (FOR GRADE 3, or 4 HYPERSENSITIVITY REACTIONS). 6.7 g 0   amitriptyline  (ELAVIL ) 10 MG tablet Take 10 mg by mouth at bedtime.     amLODipine  (NORVASC ) 10 MG tablet Take 1 tablet (10 mg total) by mouth daily. 90 tablet 1   aspirin  EC 81 MG tablet Take 81 mg by mouth daily.     atorvastatin  (LIPITOR) 40 MG tablet Take 40 mg by mouth at bedtime.     Calcium  Carb-Cholecalciferol 600-10 MG-MCG TABS Take 1 tablet by mouth 2 (two) times daily.     FLUoxetine  (PROZAC ) 20 MG capsule Take 20 mg by mouth daily.     folic acid  (FOLVITE ) 1 MG tablet Take 1 tablet (1 mg total) by mouth daily. 90 tablet 1   loratadine  (CLARITIN ) 10 MG tablet Take 10 mg by mouth daily.     omeprazole (PRILOSEC) 20 MG capsule Take 20 mg by mouth daily.     traZODone  (DESYREL ) 100 MG tablet Take 100 mg by mouth at bedtime.     predniSONE  (DELTASONE ) 20 MG tablet Take 2 tablets (40 mg total) by mouth daily with breakfast.     No current facility-administered medications for this visit.    PHYSICAL EXAMINATION: There were no vitals filed for this visit.  There were no vitals filed for this visit.  Physical Exam Vitals reviewed.  Constitutional:      Appearance: He is not ill-appearing.  HENT:     Head: Normocephalic and atraumatic.     Ears:      Comments: Hard of hearing Cardiovascular:     Rate and Rhythm: Normal rate and regular rhythm.  Pulmonary:     Comments: Decreased breath sounds bilaterally.  Abdominal:     General: There is no distension.     Palpations: Abdomen is soft.  Skin:    General: Skin is warm.     Coloration: Skin is not pale.  Neurological:     General: No focal deficit present.     Mental Status: He is alert and oriented to person, place, and time.  Psychiatric:        Behavior: Behavior normal.     Comments: confused     LABORATORY DATA:  I have reviewed the data as listed Lab Results  Component Value Date  WBC 6.1 12/29/2023   HGB 10.7 (L) 12/29/2023   HCT 30.7 (L) 12/29/2023   MCV 99.0 12/29/2023   PLT 101 (L) 12/29/2023   Recent Labs    12/12/23 1656 12/13/23 0635 12/24/23 0451 12/25/23 0937 12/26/23 0800 12/29/23 1309  NA  --    < > 136 135  --  134*  K  --    < > 4.6 4.3  --  3.9  CL  --    < > 108 104  --  105  CO2  --    < > 25 26  --  23  GLUCOSE  --    < > 134* 115*  --  127*  BUN  --    < > 19 26*  --  30*  CREATININE  --    < > 1.20 0.98  --  1.04  CALCIUM   --    < > 8.5* 9.4  --  9.5  GFRNONAA  --    < > >60 >60  --  >60  PROT  --    < > 5.2* 5.2*  --  6.1*  ALBUMIN  --    < > 2.7* 2.8*  --  3.6  AST  --    < > 26 21  --  34  ALT  --    < > 33 29  --  41  ALKPHOS  --    < > 85 84  --  161*  BILITOT 4.1*   < > 3.2* 1.8* 1.4* 3.6*  BILIDIR 0.9*  --   --   --  0.5*  --   IBILI 3.2*  --   --   --  0.9  --    < > = values in this interval not displayed.    RADIOGRAPHIC STUDIES:   Assessment & Plan:  78 year old male patient with a history of new onset of pancytopenia with dominant autoimmune hemolytic anemia who presents to clinic for hospital follow up:    # Severe autoimmune hemolytic anemia - hemoglobin around 7-suspect intolerance/lack of significant response to steroids. Patient status post rituximab  infusion while hospitalized. Discharged on prednisone  60 mg  daily.   Hemoglobin today is 10.7.  Improved since hospitalization.  LDH was also improved.  Bilirubin is elevated consistent with ongoing hemolysis.  Think it is reasonable to decrease his prednisone  to 40 mg daily.  Will plan for slow weaning over the next 1 to 2 months based on the hemoglobin.  I will see him next week for reevaluation and consideration of rituximab .  # Generalized confusion-no focal deficits suspect secondary to steroid induced.  Not yet at baseline   Disposition: Follow-up as scheduled- la  No problem-specific Assessment & Plan notes found for this encounter.  Above plan of care was discussed with patient/family in detail.  My contact information was given to the patient/family.     Tinnie KANDICE Dawn, NP 12/29/2023

## 2024-01-03 ENCOUNTER — Other Ambulatory Visit: Payer: Self-pay | Admitting: *Deleted

## 2024-01-03 DIAGNOSIS — D5911 Warm autoimmune hemolytic anemia: Secondary | ICD-10-CM

## 2024-01-04 ENCOUNTER — Inpatient Hospital Stay

## 2024-01-04 ENCOUNTER — Other Ambulatory Visit: Payer: Self-pay

## 2024-01-04 ENCOUNTER — Inpatient Hospital Stay (HOSPITAL_BASED_OUTPATIENT_CLINIC_OR_DEPARTMENT_OTHER): Admitting: Nurse Practitioner

## 2024-01-04 VITALS — BP 148/71 | HR 79 | Temp 96.2°F | Resp 20 | Ht 69.0 in | Wt 185.6 lb

## 2024-01-04 VITALS — BP 131/66 | HR 77 | Temp 98.3°F | Resp 16

## 2024-01-04 DIAGNOSIS — D5911 Warm autoimmune hemolytic anemia: Secondary | ICD-10-CM

## 2024-01-04 DIAGNOSIS — Z5112 Encounter for antineoplastic immunotherapy: Secondary | ICD-10-CM | POA: Diagnosis not present

## 2024-01-04 LAB — CBC WITH DIFFERENTIAL/PLATELET
Abs Immature Granulocytes: 0.07 K/uL (ref 0.00–0.07)
Basophils Absolute: 0 K/uL (ref 0.0–0.1)
Basophils Relative: 0 %
Eosinophils Absolute: 0 K/uL (ref 0.0–0.5)
Eosinophils Relative: 1 %
HCT: 28.5 % — ABNORMAL LOW (ref 39.0–52.0)
Hemoglobin: 10.2 g/dL — ABNORMAL LOW (ref 13.0–17.0)
Immature Granulocytes: 2 %
Lymphocytes Relative: 23 %
Lymphs Abs: 1 K/uL (ref 0.7–4.0)
MCH: 35.1 pg — ABNORMAL HIGH (ref 26.0–34.0)
MCHC: 35.8 g/dL (ref 30.0–36.0)
MCV: 97.9 fL (ref 80.0–100.0)
Monocytes Absolute: 0.2 K/uL (ref 0.1–1.0)
Monocytes Relative: 5 %
Neutro Abs: 2.9 K/uL (ref 1.7–7.7)
Neutrophils Relative %: 69 %
Platelets: 57 K/uL — ABNORMAL LOW (ref 150–400)
RBC: 2.91 MIL/uL — ABNORMAL LOW (ref 4.22–5.81)
RDW: 16.9 % — ABNORMAL HIGH (ref 11.5–15.5)
WBC: 4.2 K/uL (ref 4.0–10.5)
nRBC: 0 % (ref 0.0–0.2)

## 2024-01-04 LAB — CMP (CANCER CENTER ONLY)
ALT: 47 U/L — ABNORMAL HIGH (ref 0–44)
AST: 31 U/L (ref 15–41)
Albumin: 3.3 g/dL — ABNORMAL LOW (ref 3.5–5.0)
Alkaline Phosphatase: 131 U/L — ABNORMAL HIGH (ref 38–126)
Anion gap: 6 (ref 5–15)
BUN: 24 mg/dL — ABNORMAL HIGH (ref 8–23)
CO2: 24 mmol/L (ref 22–32)
Calcium: 9.2 mg/dL (ref 8.9–10.3)
Chloride: 105 mmol/L (ref 98–111)
Creatinine: 1.01 mg/dL (ref 0.61–1.24)
GFR, Estimated: >60 mL/min (ref >60)
Glucose, Bld: 111 mg/dL — ABNORMAL HIGH (ref 70–99)
Potassium: 3.1 mmol/L — ABNORMAL LOW (ref 3.5–5.1)
Sodium: 135 mmol/L (ref 135–145)
Total Bilirubin: 3.7 mg/dL (ref 0.0–1.2)
Total Protein: 5.5 g/dL — ABNORMAL LOW (ref 6.5–8.1)

## 2024-01-04 LAB — LACTATE DEHYDROGENASE: LDH: 204 U/L — ABNORMAL HIGH (ref 98–192)

## 2024-01-04 MED ORDER — ACETAMINOPHEN 325 MG PO TABS
650.0000 mg | ORAL_TABLET | Freq: Once | ORAL | Status: AC
  Administered 2024-01-04: 650 mg via ORAL
  Filled 2024-01-04: qty 2

## 2024-01-04 MED ORDER — DIPHENHYDRAMINE HCL 25 MG PO CAPS
50.0000 mg | ORAL_CAPSULE | Freq: Once | ORAL | Status: AC
  Administered 2024-01-04: 50 mg via ORAL
  Filled 2024-01-04: qty 2

## 2024-01-04 MED ORDER — SODIUM CHLORIDE 0.9 % IV SOLN
375.0000 mg/m2 | Freq: Once | INTRAVENOUS | Status: AC
  Administered 2024-01-04: 800 mg via INTRAVENOUS
  Filled 2024-01-04: qty 30

## 2024-01-04 MED ORDER — DEXAMETHASONE SODIUM PHOSPHATE 10 MG/ML IJ SOLN
10.0000 mg | Freq: Once | INTRAMUSCULAR | Status: AC
  Administered 2024-01-04: 10 mg via INTRAVENOUS
  Filled 2024-01-04: qty 1

## 2024-01-04 MED ORDER — SODIUM CHLORIDE 0.9 % IV SOLN
INTRAVENOUS | Status: DC
  Filled 2024-01-04: qty 250

## 2024-01-04 NOTE — Patient Instructions (Signed)
 Rituximab Injection What is this medication? RITUXIMAB (ri TUX i mab) treats leukemia and lymphoma. It works by blocking a protein that causes cancer cells to grow and multiply. This helps to slow or stop the spread of cancer cells. It may also be used to treat autoimmune conditions, such as arthritis. It works by slowing down an overactive immune system. It is a monoclonal antibody. This medicine may be used for other purposes; ask your health care provider or pharmacist if you have questions. COMMON BRAND NAME(S): RIABNI, Rituxan, RUXIENCE, truxima What should I tell my care team before I take this medication? They need to know if you have any of these conditions: Chest pain Heart disease Immune system problems Infection, such as chickenpox, cold sores, hepatitis B, herpes Irregular heartbeat or rhythm Kidney disease Low blood counts, such as low white cells, platelets, red cells Lung disease Recent or upcoming vaccine An unusual or allergic reaction to rituximab, other medications, foods, dyes, or preservatives Pregnant or trying to get pregnant Breast-feeding How should I use this medication? This medication is injected into a vein. It is given by a care team in a hospital or clinic setting. A special MedGuide will be given to you before each treatment. Be sure to read this information carefully each time. Talk to your care team about the use of this medication in children. While this medication may be prescribed for children as young as 6 months for selected conditions, precautions do apply. Overdosage: If you think you have taken too much of this medicine contact a poison control center or emergency room at once. NOTE: This medicine is only for you. Do not share this medicine with others. What if I miss a dose? Keep appointments for follow-up doses. It is important not to miss your dose. Call your care team if you are unable to keep an appointment. What may interact with this  medication? Do not take this medication with any of the following: Live vaccines This medication may also interact with the following: Cisplatin This list may not describe all possible interactions. Give your health care provider a list of all the medicines, herbs, non-prescription drugs, or dietary supplements you use. Also tell them if you smoke, drink alcohol, or use illegal drugs. Some items may interact with your medicine. What should I watch for while using this medication? Your condition will be monitored carefully while you are receiving this medication. You may need blood work while taking this medication. This medication can cause serious infusion reactions. To reduce the risk your care team may give you other medications to take before receiving this one. Be sure to follow the directions from your care team. This medication may increase your risk of getting an infection. Call your care team for advice if you get a fever, chills, sore throat, or other symptoms of a cold or flu. Do not treat yourself. Try to avoid being around people who are sick. Call your care team if you are around anyone with measles, chickenpox, or if you develop sores or blisters that do not heal properly. Avoid taking medications that contain aspirin, acetaminophen, ibuprofen, naproxen, or ketoprofen unless instructed by your care team. These medications may hide a fever. This medication may cause serious skin reactions. They can happen weeks to months after starting the medication. Contact your care team right away if you notice fevers or flu-like symptoms with a rash. The rash may be red or purple and then turn into blisters or peeling of the skin.  You may also notice a red rash with swelling of the face, lips, or lymph nodes in your neck or under your arms. In some patients, this medication may cause a serious brain infection that may cause death. If you have any problems seeing, thinking, speaking, walking, or  standing, tell your care team right away. If you cannot reach your care team, urgently seek another source of medical care. Talk to your care team if you may be pregnant. Serious birth defects can occur if you take this medication during pregnancy and for 12 months after the last dose. You will need a negative pregnancy test before starting this medication. Contraception is recommended while taking this medication and for 12 months after the last dose. Your care team can help you find the option that works for you. Do not breastfeed while taking this medication and for at least 6 months after the last dose. What side effects may I notice from receiving this medication? Side effects that you should report to your care team as soon as possible: Allergic reactions or angioedema--skin rash, itching or hives, swelling of the face, eyes, lips, tongue, arms, or legs, trouble swallowing or breathing Bowel blockage--stomach cramping, unable to have a bowel movement or pass gas, loss of appetite, vomiting Dizziness, loss of balance or coordination, confusion or trouble speaking Heart attack--pain or tightness in the chest, shoulders, arms, or jaw, nausea, shortness of breath, cold or clammy skin, feeling faint or lightheaded Heart rhythm changes--fast or irregular heartbeat, dizziness, feeling faint or lightheaded, chest pain, trouble breathing Infection--fever, chills, cough, sore throat, wounds that don't heal, pain or trouble when passing urine, general feeling of discomfort or being unwell Infusion reactions--chest pain, shortness of breath or trouble breathing, feeling faint or lightheaded Kidney injury--decrease in the amount of urine, swelling of the ankles, hands, or feet Liver injury--right upper belly pain, loss of appetite, nausea, light-colored stool, dark yellow or brown urine, yellowing skin or eyes, unusual weakness or fatigue Redness, blistering, peeling, or loosening of the skin, including  inside the mouth Stomach pain that is severe, does not go away, or gets worse Tumor lysis syndrome (TLS)--nausea, vomiting, diarrhea, decrease in the amount of urine, dark urine, unusual weakness or fatigue, confusion, muscle pain or cramps, fast or irregular heartbeat, joint pain Side effects that usually do not require medical attention (report to your care team if they continue or are bothersome): Headache Joint pain Nausea Runny or stuffy nose Unusual weakness or fatigue This list may not describe all possible side effects. Call your doctor for medical advice about side effects. You may report side effects to FDA at 1-800-FDA-1088. Where should I keep my medication? This medication is given in a hospital or clinic. It will not be stored at home. NOTE: This sheet is a summary. It may not cover all possible information. If you have questions about this medicine, talk to your doctor, pharmacist, or health care provider.  2024 Elsevier/Gold Standard (2021-08-26 00:00:00)

## 2024-01-04 NOTE — Progress Notes (Signed)
 [facility] Clinical Social Work  Clinical Social Work was referred by nurse for assessment of psychosocial needs.  Clinical Social Worker met with patient to offer support and assess for needs.  CSW Intern Hildegard Daring, was also present.   Interventions: Patient was unable to state where he was, the date and what he was receiving treatment for.  He was able to identify that he lives with his son and that his son cares for him, including overseeing his medications.  Patient was able to state when his wife died.  Patient's son was not present, but would be returning to take patient home.  Patient stated he had no needs at this time.      Follow Up Plan:  CSW will follow-up with patient by phone     Brian CHRISTELLA Au, LCSW  Clinical Social Worker [facility]

## 2024-01-04 NOTE — Progress Notes (Signed)
 [facility] CONSULT NOTE  Patient Care Team: Center, [facility] as PCP - General (General Practice) [name], MD as Consulting Physician (Oncology)  CHIEF COMPLAINTS/PURPOSE OF CONSULTATION: Hemolytic anemia  Oncology History   No history exists.    HISTORY OF PRESENTING ILLNESS: Patient ambulating independently. Accompanied by son  Brian Montes 78 y.o.  male pleasant patient with hx of hyponatremia, NASH-recently admitted to the hospital for acute onset of autoimmune hemolytic anemia.  He continues prednisone  40 g daily.  Mentation is improving. 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  Now living with his son at his son's house.  Chronic back pain which is not any worse.  No chest pain.  Appetite is stable.  Review of Systems  Reason unable to perform ROS: ROS provided by son.  Constitutional:  Positive for malaise/fatigue. Negative for chills, diaphoresis, fever and weight loss.  HENT:  Negative for nosebleeds and sore throat.   Eyes:  Negative for double vision.  Respiratory:  Negative for cough, hemoptysis, sputum production, shortness of breath and wheezing.   Cardiovascular:  Negative for chest pain, palpitations, orthopnea and leg swelling.  Gastrointestinal:  Negative for abdominal pain, blood in stool, constipation, diarrhea, heartburn, melena, nausea and vomiting.  Genitourinary:  Negative for dysuria, frequency and urgency.  Musculoskeletal:  Positive for back pain and joint pain.  Skin:  Negative for itching and rash.  Neurological:  Negative for dizziness, tingling, focal weakness, weakness and headaches.  Endo/Heme/Allergies:  Does not bruise/bleed easily.  Psychiatric/Behavioral:  Positive for memory loss. Negative for depression. The patient is not nervous/anxious and does not have insomnia.    MEDICAL HISTORY:  Past Medical History:  Diagnosis Date   Acid reflux    Calculus of gallbladder with acute and chronic cholecystitis without  obstruction    Essential hypertension    Hyperlipidemia due to dietary fat intake    SURGICAL HISTORY: Past Surgical History:  Procedure Laterality Date   CARDIAC CATHETERIZATION  2010   Dr. Bosie - NORMAL CORONARIES   CHOLECYSTECTOMY N/A 11/02/2017   Procedure: LAPAROSCOPIC CHOLECYSTECTOMY;  Surgeon: [name], MD;  Location: [facility] ORS;  Service: General;  Laterality: N/A;   RIGHT COLECTOMY  2009   at [facility]   SOCIAL HISTORY: Social History   Socioeconomic History   Marital status: Married    Spouse name: Not on file   Number of children: Not on file   Years of education: Not on file   Highest education level: Not on file  Occupational History   Not on file  Tobacco Use   Smoking status: Former   Smokeless tobacco: Current    Types: Chew  Vaping Use   Vaping status: Never Used  Substance and Sexual Activity   Alcohol use: Not Currently   Drug use: Never   Sexual activity: Not on file  Other Topics Concern   Not on file  Social History Narrative   Not on file   Social Drivers of Health   Financial Resource Strain: Not on file  Food Insecurity: No Food Insecurity (12/23/2023)   Hunger Vital Sign    Worried About Running Out of Food in the Last Year: Never true    Ran Out of Food in the Last Year: Never true  Transportation Needs: No Transportation Needs (12/23/2023)   PRAPARE - Administrator, Civil Service (Medical): No    Lack of Transportation (Non-Medical): No  Physical Activity: Not on file  Stress: Not on  file  Social Connections: Unknown (12/23/2023)   Social Connection and Isolation Panel    Frequency of Communication with Friends and Family: More than three times a week    Frequency of Social Gatherings with Friends and Family: More than three times a week    Attends Religious Services: Patient declined    Database administrator or Organizations: Patient declined    Attends Banker Meetings: Patient declined    Marital Status:  Patient declined  Intimate Partner Violence: Not At Risk (12/23/2023)   Humiliation, Afraid, Rape, and Kick questionnaire    Fear of Current or Ex-Partner: No    Emotionally Abused: No    Physically Abused: No    Sexually Abused: No   FAMILY HISTORY: Family History  Problem Relation Age of Onset   CAD Father    Diabetes Brother    Bladder Cancer Other    ALLERGIES:  is allergic to codeine.  MEDICATIONS:  Current Outpatient Medications  Medication Sig Dispense Refill   albuterol  (VENTOLIN  HFA) 108 (90 Base) MCG/ACT inhaler Inhale 2 puffs into the lungs once as needed for wheezing or shortness of breath (FOR GRADE 3, or 4 HYPERSENSITIVITY REACTIONS). 6.7 g 0   amitriptyline  (ELAVIL ) 10 MG tablet Take 10 mg by mouth at bedtime.     amLODipine  (NORVASC ) 10 MG tablet Take 1 tablet (10 mg total) by mouth daily. 90 tablet 1   aspirin  EC 81 MG tablet Take 81 mg by mouth daily.     atorvastatin  (LIPITOR) 40 MG tablet Take 40 mg by mouth at bedtime.     Calcium  Carb-Cholecalciferol 600-10 MG-MCG TABS Take 1 tablet by mouth 2 (two) times daily.     FLUoxetine  (PROZAC ) 20 MG capsule Take 20 mg by mouth daily.     folic acid  (FOLVITE ) 1 MG tablet Take 1 tablet (1 mg total) by mouth daily. 90 tablet 1   loratadine  (CLARITIN ) 10 MG tablet Take 10 mg by mouth daily.     omeprazole (PRILOSEC) 20 MG capsule Take 20 mg by mouth daily.     predniSONE  (DELTASONE ) 20 MG tablet Take 2 tablets (40 mg total) by mouth daily with breakfast.     traZODone  (DESYREL ) 100 MG tablet Take 100 mg by mouth at bedtime.     No current facility-administered medications for this visit.    PHYSICAL EXAMINATION: Vitals:   01/04/24 0843 01/04/24 0848  BP: (!) 150/71 (!) 148/71  Pulse: 77 79  Resp:  20  Temp:  (!) 96.2 F (35.7 C)  SpO2:  100%   Filed Weights   01/04/24 0846  Weight: 185 lb 9.6 oz (84.2 kg)   Physical Exam Vitals reviewed.  Constitutional:      Appearance: He is not ill-appearing.  HENT:      Head: Normocephalic and atraumatic.     Ears:     Comments: Hard of hearing Cardiovascular:     Rate and Rhythm: Normal rate and regular rhythm.  Pulmonary:     Comments: Decreased breath sounds bilaterally.  Abdominal:     General: There is no distension.     Palpations: Abdomen is soft.  Skin:    General: Skin is warm.     Coloration: Skin is not pale.  Neurological:     General: No focal deficit present.     Mental Status: He is alert and oriented to person, place, and time.  Psychiatric:        Behavior: Behavior normal.  Comments: confused    LABORATORY DATA:  I have reviewed the data as listed Lab Results  Component Value Date   WBC 4.2 01/04/2024   HGB 10.2 (L) 01/04/2024   HCT 28.5 (L) 01/04/2024   MCV 97.9 01/04/2024   PLT 57 (L) 01/04/2024   Recent Labs    12/12/23 1656 12/13/23 0635 12/24/23 0451 12/25/23 0937 12/26/23 0800 12/29/23 1309  NA  --    < > 136 135  --  134*  K  --    < > 4.6 4.3  --  3.9  CL  --    < > 108 104  --  105  CO2  --    < > 25 26  --  23  GLUCOSE  --    < > 134* 115*  --  127*  BUN  --    < > 19 26*  --  30*  CREATININE  --    < > 1.20 0.98  --  1.04  CALCIUM   --    < > 8.5* 9.4  --  9.5  GFRNONAA  --    < > >60 >60  --  >60  PROT  --    < > 5.2* 5.2*  --  6.1*  ALBUMIN  --    < > 2.7* 2.8*  --  3.6  AST  --    < > 26 21  --  34  ALT  --    < > 33 29  --  41  ALKPHOS  --    < > 85 84  --  161*  BILITOT 4.1*   < > 3.2* 1.8* 1.4* 3.6*  BILIDIR 0.9*  --   --   --  0.5*  --   IBILI 3.2*  --   --   --  0.9  --    < > = values in this interval not displayed.    RADIOGRAPHIC STUDIES:  Assessment & Plan:  78 year old male patient who returns to clinic for follow up of:    # Severe autoimmune hemolytic anemia - hemoglobin around 7-suspect intolerance/lack of significant response to steroids. Patient status post rituximab  infusion while hospitalized. Discharged on prednisone  60 mg daily. Now decreased to 40 mg.    Hemoglobin today is 10.2. LDH stable. Continue prednisone  40 mg daily. Will plan to slow wean over the next 1 to 2 months based on hemoglobin. Labs today reviewed and acceptable for continuation of rituximab .   # Generalized confusion- thought to be secondary to steroids. No focal deficits. Has improved with decrease in steroids. Monitor.   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 Rituximab  today as planned. Monitor.   Disposition:  Rituximab  today 1 week- lab, Dr Rennie, +/- rituximab - [place]  No problem-specific Assessment & Plan notes found for this encounter.  Above plan of care was discussed with patient/family in detail.  My contact information was given to the patient/family.     Tinnie KANDICE Dawn, NP 01/04/2024

## 2024-01-04 NOTE — Progress Notes (Signed)
 [facility] Clinical Social Work  Clinical Social Work was referred by nurse for assessment of psychosocial needs.  Clinical Social Worker met with patient to offer support and assess for needs.  CSW Intern, Hildegard Daring, was also present.  Interventions: Patient was oriented to person.  He could not state the hospital he was in or what he was being treated for.  Patient did acknowledge he lives with his son who provides care and manages his medications.  Patient expressed no needs at this time.      Follow Up Plan:  CSW will follow-up with patient by phone     Brian CHRISTELLA Au, LCSW  Clinical Social Worker [facility]

## 2024-01-11 ENCOUNTER — Other Ambulatory Visit: Payer: Self-pay | Admitting: *Deleted

## 2024-01-11 ENCOUNTER — Encounter: Payer: Self-pay | Admitting: Internal Medicine

## 2024-01-11 DIAGNOSIS — D5911 Warm autoimmune hemolytic anemia: Secondary | ICD-10-CM

## 2024-01-12 ENCOUNTER — Inpatient Hospital Stay

## 2024-01-12 ENCOUNTER — Encounter: Payer: Self-pay | Admitting: Internal Medicine

## 2024-01-12 ENCOUNTER — Inpatient Hospital Stay (HOSPITAL_BASED_OUTPATIENT_CLINIC_OR_DEPARTMENT_OTHER): Admitting: Internal Medicine

## 2024-01-12 VITALS — BP 136/72 | HR 85 | Temp 98.9°F | Resp 16 | Ht 69.0 in | Wt 190.0 lb

## 2024-01-12 VITALS — BP 137/65 | HR 78 | Temp 97.4°F | Resp 18

## 2024-01-12 DIAGNOSIS — Z5112 Encounter for antineoplastic immunotherapy: Secondary | ICD-10-CM | POA: Diagnosis not present

## 2024-01-12 DIAGNOSIS — D5911 Warm autoimmune hemolytic anemia: Secondary | ICD-10-CM

## 2024-01-12 LAB — CBC WITH DIFFERENTIAL (CANCER CENTER ONLY)
Abs Immature Granulocytes: 0.12 K/uL — ABNORMAL HIGH (ref 0.00–0.07)
Basophils Absolute: 0 K/uL (ref 0.0–0.1)
Basophils Relative: 0 %
Eosinophils Absolute: 0 K/uL (ref 0.0–0.5)
Eosinophils Relative: 0 %
HCT: 25.6 % — ABNORMAL LOW (ref 39.0–52.0)
Hemoglobin: 9.2 g/dL — ABNORMAL LOW (ref 13.0–17.0)
Immature Granulocytes: 2 %
Lymphocytes Relative: 19 %
Lymphs Abs: 1.3 K/uL (ref 0.7–4.0)
MCH: 34.7 pg — ABNORMAL HIGH (ref 26.0–34.0)
MCHC: 35.9 g/dL (ref 30.0–36.0)
MCV: 96.6 fL (ref 80.0–100.0)
Monocytes Absolute: 0.2 K/uL (ref 0.1–1.0)
Monocytes Relative: 4 %
Neutro Abs: 5 K/uL (ref 1.7–7.7)
Neutrophils Relative %: 75 %
Platelet Count: 69 K/uL — ABNORMAL LOW (ref 150–400)
RBC: 2.65 MIL/uL — ABNORMAL LOW (ref 4.22–5.81)
RDW: 17.4 % — ABNORMAL HIGH (ref 11.5–15.5)
WBC Count: 6.6 K/uL (ref 4.0–10.5)
nRBC: 0 % (ref 0.0–0.2)

## 2024-01-12 LAB — CMP (CANCER CENTER ONLY)
ALT: 48 U/L — ABNORMAL HIGH (ref 0–44)
AST: 36 U/L (ref 15–41)
Albumin: 3.2 g/dL — ABNORMAL LOW (ref 3.5–5.0)
Alkaline Phosphatase: 94 U/L (ref 38–126)
Anion gap: 7 (ref 5–15)
BUN: 38 mg/dL — ABNORMAL HIGH (ref 8–23)
CO2: 24 mmol/L (ref 22–32)
Calcium: 9.4 mg/dL (ref 8.9–10.3)
Chloride: 107 mmol/L (ref 98–111)
Creatinine: 1.06 mg/dL (ref 0.61–1.24)
GFR, Estimated: >60 mL/min (ref >60)
Glucose, Bld: 94 mg/dL (ref 70–99)
Potassium: 3.2 mmol/L — ABNORMAL LOW (ref 3.5–5.1)
Sodium: 138 mmol/L (ref 135–145)
Total Bilirubin: 3.3 mg/dL — ABNORMAL HIGH (ref 0.0–1.2)
Total Protein: 5.4 g/dL — ABNORMAL LOW (ref 6.5–8.1)

## 2024-01-12 LAB — LACTATE DEHYDROGENASE: LDH: 264 U/L — ABNORMAL HIGH (ref 98–192)

## 2024-01-12 MED ORDER — DEXAMETHASONE SODIUM PHOSPHATE 10 MG/ML IJ SOLN
10.0000 mg | Freq: Once | INTRAMUSCULAR | Status: AC
  Administered 2024-01-12: 10 mg via INTRAVENOUS
  Filled 2024-01-12: qty 1

## 2024-01-12 MED ORDER — DIPHENHYDRAMINE HCL 25 MG PO CAPS
50.0000 mg | ORAL_CAPSULE | Freq: Once | ORAL | Status: AC
  Administered 2024-01-12: 50 mg via ORAL
  Filled 2024-01-12: qty 2

## 2024-01-12 MED ORDER — ACETAMINOPHEN 325 MG PO TABS
650.0000 mg | ORAL_TABLET | Freq: Once | ORAL | Status: AC
  Administered 2024-01-12: 650 mg via ORAL
  Filled 2024-01-12: qty 2

## 2024-01-12 MED ORDER — SODIUM CHLORIDE 0.9 % IV SOLN
375.0000 mg/m2 | Freq: Once | INTRAVENOUS | Status: AC
  Administered 2024-01-12: 800 mg via INTRAVENOUS
  Filled 2024-01-12: qty 30

## 2024-01-12 MED ORDER — SODIUM CHLORIDE 0.9 % IV SOLN
INTRAVENOUS | Status: DC
  Filled 2024-01-12: qty 250

## 2024-01-12 NOTE — Progress Notes (Signed)
 [facility] CONSULT NOTE  Patient Care Team: Center, [facility] as PCP - General (General Practice) [name], MD as Consulting Physician (Oncology)  CHIEF COMPLAINTS/PURPOSE OF CONSULTATION: Hemolytic anemia  Oncology History   No history exists.    HISTORY OF PRESENTING ILLNESS: Patient ambulating- in wheel chair. Accompanied by son  Brian Montes 78 y.o.  male pleasant patient with   with hx of hyponatremia, NASH-recently admitted to the hospital for acute onset of autoimmune hemolytic anemia-on prednisone  and rituximab  is here for follow-up  Patient has not had any further hospitalizations.  No further episodes of confusion.  He admits to compliance taking the prednisone .   Complains of ongoing fatigue.  Review of Systems  Constitutional:  Positive for malaise/fatigue. Negative for chills, diaphoresis, fever and weight loss.  HENT:  Negative for nosebleeds and sore throat.   Eyes:  Negative for double vision.  Respiratory:  Negative for cough, hemoptysis, sputum production, shortness of breath and wheezing.   Cardiovascular:  Negative for chest pain, palpitations, orthopnea and leg swelling.  Gastrointestinal:  Negative for abdominal pain, blood in stool, constipation, diarrhea, heartburn, melena, nausea and vomiting.  Genitourinary:  Negative for dysuria, frequency and urgency.  Musculoskeletal:  Positive for back pain and joint pain.  Skin: Negative.  Negative for itching and rash.  Neurological:  Negative for dizziness, tingling, focal weakness, weakness and headaches.  Endo/Heme/Allergies:  Does not bruise/bleed easily.  Psychiatric/Behavioral:  Negative for depression. The patient is not nervous/anxious and does not have insomnia.     MEDICAL HISTORY:  Past Medical History:  Diagnosis Date   Acid reflux    Calculus of gallbladder with acute and chronic cholecystitis without obstruction    Essential hypertension     Hyperlipidemia due to dietary fat intake     SURGICAL HISTORY: Past Surgical History:  Procedure Laterality Date   CARDIAC CATHETERIZATION  2010   Dr. Bosie - NORMAL CORONARIES   CHOLECYSTECTOMY N/A 11/02/2017   Procedure: LAPAROSCOPIC CHOLECYSTECTOMY;  Surgeon: [name], MD;  Location: [facility] ORS;  Service: General;  Laterality: N/A;   RIGHT COLECTOMY  2009   at [facility]    SOCIAL HISTORY: Social History   Socioeconomic History   Marital status: Married    Spouse name: Not on file   Number of children: Not on file   Years of education: Not on file   Highest education level: Not on file  Occupational History   Not on file  Tobacco Use   Smoking status: Former   Smokeless tobacco: Current    Types: Chew  Vaping Use   Vaping status: Never Used  Substance and Sexual Activity   Alcohol use: Not Currently   Drug use: Never   Sexual activity: Not on file  Other Topics Concern   Not on file  Social History Narrative   Not on file   Social Drivers of Health   Financial Resource Strain: Not on file  Food Insecurity: No Food Insecurity (12/23/2023)   Hunger Vital Sign    Worried About Running Out of Food in the Last Year: Never true    Ran Out of Food in the Last Year: Never true  Transportation Needs: No Transportation Needs (12/23/2023)   PRAPARE - Administrator, Civil Service (Medical): No    Lack of Transportation (Non-Medical): No  Physical Activity: Not on file  Stress: Not on file  Social Connections: Unknown (12/23/2023)   Social Connection and Isolation Panel  Frequency of Communication with Friends and Family: More than three times a week    Frequency of Social Gatherings with Friends and Family: More than three times a week    Attends Religious Services: Patient declined    Database administrator or Organizations: Patient declined    Attends Banker Meetings: Patient declined    Marital Status: Patient declined  Intimate Partner  Violence: Not At Risk (12/23/2023)   Humiliation, Afraid, Rape, and Kick questionnaire    Fear of Current or Ex-Partner: No    Emotionally Abused: No    Physically Abused: No    Sexually Abused: No    FAMILY HISTORY: Family History  Problem Relation Age of Onset   CAD Father    Diabetes Brother    Bladder Cancer Other     ALLERGIES:  is allergic to codeine.  MEDICATIONS:  Current Outpatient Medications  Medication Sig Dispense Refill   albuterol  (VENTOLIN  HFA) 108 (90 Base) MCG/ACT inhaler Inhale 2 puffs into the lungs once as needed for wheezing or shortness of breath (FOR GRADE 3, or 4 HYPERSENSITIVITY REACTIONS). 6.7 g 0   amitriptyline  (ELAVIL ) 10 MG tablet Take 10 mg by mouth at bedtime.     amLODipine  (NORVASC ) 10 MG tablet Take 1 tablet (10 mg total) by mouth daily. 90 tablet 1   aspirin  EC 81 MG tablet Take 81 mg by mouth daily.     atorvastatin  (LIPITOR) 40 MG tablet Take 40 mg by mouth at bedtime.     Calcium  Carb-Cholecalciferol 600-10 MG-MCG TABS Take 1 tablet by mouth 2 (two) times daily.     FLUoxetine  (PROZAC ) 20 MG capsule Take 20 mg by mouth daily.     folic acid  (FOLVITE ) 1 MG tablet Take 1 tablet (1 mg total) by mouth daily. 90 tablet 1   loratadine  (CLARITIN ) 10 MG tablet Take 10 mg by mouth daily.     omeprazole (PRILOSEC) 20 MG capsule Take 20 mg by mouth daily.     predniSONE  (DELTASONE ) 20 MG tablet Take 2 tablets (40 mg total) by mouth daily with breakfast.     traZODone  (DESYREL ) 100 MG tablet Take 100 mg by mouth at bedtime.     No current facility-administered medications for this visit.    PHYSICAL EXAMINATION:   Vitals:   01/12/24 0908 01/12/24 0957  BP: (!) 144/76 136/72  Pulse: 85   Resp: 16   Temp: 98.9 F (37.2 C)   SpO2: 99%    Filed Weights   01/12/24 0908  Weight: 190 lb (86.2 kg)    Physical Exam Vitals and nursing note reviewed.  HENT:     Head: Normocephalic and atraumatic.     Mouth/Throat:     Pharynx: Oropharynx is  clear.  Eyes:     Extraocular Movements: Extraocular movements intact.     Pupils: Pupils are equal, round, and reactive to light.  Cardiovascular:     Rate and Rhythm: Normal rate and regular rhythm.  Pulmonary:     Comments: Decreased breath sounds bilaterally.  Abdominal:     Palpations: Abdomen is soft.  Musculoskeletal:        General: Normal range of motion.     Cervical back: Normal range of motion.  Skin:    General: Skin is warm.  Neurological:     General: No focal deficit present.     Mental Status: He is alert and oriented to person, place, and time.  Psychiatric:  Behavior: Behavior normal.        Judgment: Judgment normal.     LABORATORY DATA:  I have reviewed the data as listed Lab Results  Component Value Date   WBC 6.6 01/12/2024   HGB 9.2 (L) 01/12/2024   HCT 25.6 (L) 01/12/2024   MCV 96.6 01/12/2024   PLT 69 (L) 01/12/2024   Recent Labs    12/12/23 1656 12/13/23 0635 12/26/23 0800 12/29/23 1309 01/04/24 0816 01/12/24 0910  NA  --    < >  --  134* 135 138  K  --    < >  --  3.9 3.1* 3.2*  CL  --    < >  --  105 105 107  CO2  --    < >  --  23 24 24   GLUCOSE  --    < >  --  127* 111* 94  BUN  --    < >  --  30* 24* 38*  CREATININE  --    < >  --  1.04 1.01 1.06  CALCIUM   --    < >  --  9.5 9.2 9.4  GFRNONAA  --    < >  --  >60 >60 >60  PROT  --    < >  --  6.1* 5.5* 5.4*  ALBUMIN  --    < >  --  3.6 3.3* 3.2*  AST  --    < >  --  34 31 36  ALT  --    < >  --  41 47* 48*  ALKPHOS  --    < >  --  161* 131* 94  BILITOT 4.1*   < > 1.4* 3.6* 3.7* 3.3*  BILIDIR 0.9*  --  0.5*  --   --   --   IBILI 3.2*  --  0.9  --   --   --    < > = values in this interval not displayed.    RADIOGRAPHIC STUDIES: I have personally reviewed the radiological images as listed and agreed with the findings in the report. CT Head Wo Contrast Result Date: 12/23/2023 CLINICAL DATA:  Pain and confusion. 3 day history of weakness. Pancytopenia. EXAM: CT HEAD  WITHOUT CONTRAST TECHNIQUE: Contiguous axial images were obtained from the base of the skull through the vertex without intravenous contrast. RADIATION DOSE REDUCTION: This exam was performed according to the departmental dose-optimization program which includes automated exposure control, adjustment of the mA and/or kV according to patient size and/or use of iterative reconstruction technique. COMPARISON:  None Available. FINDINGS: Brain: There is no evidence for acute hemorrhage, hydrocephalus, mass lesion, or abnormal extra-axial fluid collection. No definite CT evidence for acute infarction. Diffuse loss of parenchymal volume is consistent with atrophy. Patchy low attenuation in the deep hemispheric and periventricular white matter is nonspecific, but likely reflects chronic microvascular ischemic demyelination. Vascular: No hyperdense vessel or unexpected calcification. Skull: No evidence for fracture. No worrisome lytic or sclerotic lesion. Sinuses/Orbits: Left mastoid effusion evident. Visualized paranasal sinuses are clear. Visualized portions of the globes and intraorbital fat are unremarkable. Other: None. IMPRESSION: 1. No acute intracranial abnormality. 2. Atrophy with chronic small vessel ischemic disease. 3. Left mastoid effusion. Electronically Signed   By: Camellia Candle M.D.   On: 12/23/2023 08:09   DG Chest Port 1 View Result Date: 12/23/2023 EXAM: PA AND LATERAL (2 VIEWS) XRAY OF THE CHEST 12/23/2023 07:28:41 AM COMPARISON: PA and lateral radiographs  of chest dated 12/12/2023. CLINICAL HISTORY: 141835 Weakness 141835. Weakness. FINDINGS: LUNGS AND PLEURA: No focal pulmonary opacity. No pulmonary edema. No pleural effusion. No pneumothorax. HEART AND MEDIASTINUM: No acute abnormality of the cardiac and mediastinal silhouettes. 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 IMPRESSION: 1. No acute process. Electronically signed by:  Evalene Coho MD 12/23/2023 07:59 AM EDT RP Workstation: [id number]     Warm autoimmune hemolytic anemia (HCC) # AUG 2025- [[facility]] -Predominant hemolytic anemia DAT positive warm mild thrombocytopenia--s/p 1u pRBC for Hgb 6.1-patient then --received IV solumedrol for 3 days and discharged on prednisone  80 mg daily. CT scan abdomen pelvis no evidence of any-malignancy noted.  CT chest unremarkable.   # Continue prednisone  40 mg a day hemoglobin improved at 9.4.  If no significant improvement noted consider adding  Hepatitis panel negative. Continue folic acid .   # proceed with 3/4 planned weekly rituximab  infusion today.   # Confusion question steroid psychosis-recommend tapering the dose of steroids and monitor closely.  # DISPOSITION: # rituxan  today # as planned- follow up as planned in 1 week- MD- labs-cbc/LDH; CMP; rituxan - Dr.B    Above plan of care was discussed with patient/family in detail.  My contact information was given to the patient/family.       Cindy JONELLE Joe, MD 01/15/2024 12:58 PM

## 2024-01-12 NOTE — Progress Notes (Signed)
 C/o weakness and dizziness. Asking for something to give him energy.

## 2024-01-12 NOTE — Assessment & Plan Note (Addendum)
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 CT scan abdomen pelvis no evidence of any-malignancy noted.  CT chest unremarkable.   # Continue prednisone  40 mg a day hemoglobin improved at 9.4.  If no significant improvement noted consider adding  Hepatitis panel negative. Continue folic acid .   # proceed with 3/4 planned weekly rituximab  infusion today.   # Confusion question steroid psychosis-recommend tapering the dose of steroids and monitor closely.  # DISPOSITION: # rituxan  today # as planned- follow up as planned in 1 week- MD- labs-cbc/LDH; CMP; rituxan - Dr.B

## 2024-01-15 ENCOUNTER — Encounter: Payer: Self-pay | Admitting: Internal Medicine

## 2024-01-18 ENCOUNTER — Inpatient Hospital Stay: Admitting: Internal Medicine

## 2024-01-18 ENCOUNTER — Inpatient Hospital Stay

## 2024-01-18 ENCOUNTER — Inpatient Hospital Stay: Attending: Internal Medicine

## 2024-01-18 ENCOUNTER — Encounter: Payer: Self-pay | Admitting: Internal Medicine

## 2024-01-18 ENCOUNTER — Ambulatory Visit: Payer: Self-pay | Admitting: Internal Medicine

## 2024-01-18 VITALS — BP 144/69 | HR 81

## 2024-01-18 VITALS — BP 136/64 | HR 89 | Temp 97.8°F | Resp 20 | Ht 69.0 in | Wt 188.7 lb

## 2024-01-18 DIAGNOSIS — Z7952 Long term (current) use of systemic steroids: Secondary | ICD-10-CM | POA: Diagnosis not present

## 2024-01-18 DIAGNOSIS — Z5112 Encounter for antineoplastic immunotherapy: Secondary | ICD-10-CM | POA: Diagnosis present

## 2024-01-18 DIAGNOSIS — R531 Weakness: Secondary | ICD-10-CM | POA: Diagnosis not present

## 2024-01-18 DIAGNOSIS — Z8052 Family history of malignant neoplasm of bladder: Secondary | ICD-10-CM | POA: Insufficient documentation

## 2024-01-18 DIAGNOSIS — D5911 Warm autoimmune hemolytic anemia: Secondary | ICD-10-CM

## 2024-01-18 DIAGNOSIS — R5383 Other fatigue: Secondary | ICD-10-CM | POA: Insufficient documentation

## 2024-01-18 DIAGNOSIS — Z87891 Personal history of nicotine dependence: Secondary | ICD-10-CM | POA: Diagnosis not present

## 2024-01-18 DIAGNOSIS — L03116 Cellulitis of left lower limb: Secondary | ICD-10-CM | POA: Insufficient documentation

## 2024-01-18 LAB — CMP (CANCER CENTER ONLY)
ALT: 53 U/L — ABNORMAL HIGH (ref 0–44)
AST: 39 U/L (ref 15–41)
Albumin: 2.7 g/dL — ABNORMAL LOW (ref 3.5–5.0)
Alkaline Phosphatase: 80 U/L (ref 38–126)
Anion gap: 9 (ref 5–15)
BUN: 27 mg/dL — ABNORMAL HIGH (ref 8–23)
CO2: 22 mmol/L (ref 22–32)
Calcium: 8.9 mg/dL (ref 8.9–10.3)
Chloride: 108 mmol/L (ref 98–111)
Creatinine: 1.22 mg/dL (ref 0.61–1.24)
GFR, Estimated: >60 mL/min (ref >60)
Glucose, Bld: 110 mg/dL — ABNORMAL HIGH (ref 70–99)
Potassium: 3.4 mmol/L — ABNORMAL LOW (ref 3.5–5.1)
Sodium: 139 mmol/L (ref 135–145)
Total Bilirubin: 3.3 mg/dL — ABNORMAL HIGH (ref 0.0–1.2)
Total Protein: 4.8 g/dL — ABNORMAL LOW (ref 6.5–8.1)

## 2024-01-18 LAB — CBC WITH DIFFERENTIAL (CANCER CENTER ONLY)
Abs Immature Granulocytes: 0.13 K/uL — ABNORMAL HIGH (ref 0.00–0.07)
Basophils Absolute: 0 K/uL (ref 0.0–0.1)
Basophils Relative: 0 %
Eosinophils Absolute: 0 K/uL (ref 0.0–0.5)
Eosinophils Relative: 0 %
HCT: 24.4 % — ABNORMAL LOW (ref 39.0–52.0)
Hemoglobin: 8.7 g/dL — ABNORMAL LOW (ref 13.0–17.0)
Immature Granulocytes: 3 %
Lymphocytes Relative: 27 %
Lymphs Abs: 1.2 K/uL (ref 0.7–4.0)
MCH: 35.4 pg — ABNORMAL HIGH (ref 26.0–34.0)
MCHC: 35.7 g/dL (ref 30.0–36.0)
MCV: 99.2 fL (ref 80.0–100.0)
Monocytes Absolute: 0.2 K/uL (ref 0.1–1.0)
Monocytes Relative: 5 %
Neutro Abs: 2.8 K/uL (ref 1.7–7.7)
Neutrophils Relative %: 65 %
Platelet Count: 63 K/uL — ABNORMAL LOW (ref 150–400)
RBC: 2.46 MIL/uL — ABNORMAL LOW (ref 4.22–5.81)
RDW: 18 % — ABNORMAL HIGH (ref 11.5–15.5)
WBC Count: 4.3 K/uL (ref 4.0–10.5)
nRBC: 0 % (ref 0.0–0.2)

## 2024-01-18 LAB — LACTATE DEHYDROGENASE: LDH: 259 U/L — ABNORMAL HIGH (ref 98–192)

## 2024-01-18 MED ORDER — SODIUM CHLORIDE 0.9 % IV SOLN
INTRAVENOUS | Status: DC
  Filled 2024-01-18: qty 250

## 2024-01-18 MED ORDER — DIPHENHYDRAMINE HCL 25 MG PO CAPS
50.0000 mg | ORAL_CAPSULE | Freq: Once | ORAL | Status: AC
  Administered 2024-01-18: 50 mg via ORAL
  Filled 2024-01-18: qty 2

## 2024-01-18 MED ORDER — SODIUM CHLORIDE 0.9 % IV SOLN
375.0000 mg/m2 | Freq: Once | INTRAVENOUS | Status: AC
  Administered 2024-01-18: 800 mg via INTRAVENOUS
  Filled 2024-01-18: qty 30

## 2024-01-18 MED ORDER — ACETAMINOPHEN 325 MG PO TABS
650.0000 mg | ORAL_TABLET | Freq: Once | ORAL | Status: AC
  Administered 2024-01-18: 650 mg via ORAL
  Filled 2024-01-18: qty 2

## 2024-01-18 MED ORDER — MYCOPHENOLATE MOFETIL 500 MG PO TABS: 500.0000 mg | ORAL_TABLET | Freq: Two times a day (BID) | ORAL | 0 refills | Status: DC

## 2024-01-18 MED ORDER — DEXAMETHASONE SODIUM PHOSPHATE 10 MG/ML IJ SOLN
10.0000 mg | Freq: Once | INTRAMUSCULAR | Status: AC
  Administered 2024-01-18: 10 mg via INTRAVENOUS
  Filled 2024-01-18: qty 1

## 2024-01-18 NOTE — Progress Notes (Signed)
 [name] CONSULT NOTE  Patient Care Team: Center, [name] as PCP - General (General Practice) [name], MD as Consulting Physician (Oncology)  CHIEF COMPLAINTS/PURPOSE OF CONSULTATION: Hemolytic anemia  Oncology History   No history exists.    HISTORY OF PRESENTING ILLNESS: Patient ambulating- in wheel chair. Accompanied by son  Brian Montes 78 y.o.  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 However, Patient has not had any further hospitalizations.  No further episodes of confusion.  He admits to compliance taking the prednisone .    Review of Systems  Constitutional:  Positive for malaise/fatigue. Negative for chills, diaphoresis, fever and weight loss.  HENT:  Negative for nosebleeds and sore throat.   Eyes:  Negative for double vision.  Respiratory:  Negative for cough, hemoptysis, sputum production, shortness of breath and wheezing.   Cardiovascular:  Negative for chest pain, palpitations, orthopnea and leg swelling.  Gastrointestinal:  Negative for abdominal pain, blood in stool, constipation, diarrhea, heartburn, melena, nausea and vomiting.  Genitourinary:  Negative for dysuria, frequency and urgency.  Musculoskeletal:  Positive for back pain and joint pain.  Skin: Negative.  Negative for itching and rash.  Neurological:  Negative for dizziness, tingling, focal weakness, weakness and headaches.  Endo/Heme/Allergies:  Does not bruise/bleed easily.  Psychiatric/Behavioral:  Negative for depression. The patient is not nervous/anxious and does not have insomnia.     MEDICAL HISTORY:  Past Medical History:  Diagnosis Date   Acid reflux    Calculus of gallbladder with acute and chronic cholecystitis without obstruction     Essential hypertension    Hyperlipidemia due to dietary fat intake     SURGICAL HISTORY: Past Surgical History:  Procedure Laterality Date   CARDIAC CATHETERIZATION  2010   Dr. Bosie - NORMAL CORONARIES   CHOLECYSTECTOMY N/A 11/02/2017   Procedure: LAPAROSCOPIC CHOLECYSTECTOMY;  Surgeon: [name], MD;  Location: [facility] ORS;  Service: General;  Laterality: N/A;   RIGHT COLECTOMY  2009   at [facility]    SOCIAL HISTORY: Social History   Socioeconomic History   Marital status: Married    Spouse name: Not on file   Number of children: Not on file   Years of education: Not on file   Highest education level: Not on file  Occupational History   Not on file  Tobacco Use   Smoking status: Former   Smokeless tobacco: Current    Types: Chew  Vaping Use   Vaping status: Never Used  Substance and Sexual Activity   Alcohol use: Not Currently   Drug use: Never   Sexual activity: Not on file  Other Topics Concern   Not on file  Social History Narrative   Not on file   Social Drivers of Health   Financial Resource Strain: Not on file  Food Insecurity: No Food Insecurity (12/23/2023)   Hunger Vital Sign    Worried About Running Out of Food in the Last Year: Never true    Ran Out of Food in the Last Year: Never true  Transportation Needs: No Transportation Needs (12/23/2023)   PRAPARE - Administrator, Civil Service (Medical): No    Lack of Transportation (Non-Medical): No  Physical Activity: Not on file  Stress: Not on file  Social Connections: Unknown (  12/23/2023)   Social Connection and Isolation Panel    Frequency of Communication with Friends and Family: More than three times a week    Frequency of Social Gatherings with Friends and Family: More than three times a week    Attends Religious Services: Patient declined    Database administrator or Organizations: Patient declined    Attends Banker Meetings: Patient declined    Marital Status: Patient  declined  Intimate Partner Violence: Not At Risk (12/23/2023)   Humiliation, Afraid, Rape, and Kick questionnaire    Fear of Current or Ex-Partner: No    Emotionally Abused: No    Physically Abused: No    Sexually Abused: No    FAMILY HISTORY: Family History  Problem Relation Age of Onset   CAD Father    Diabetes Brother    Bladder Cancer Other     ALLERGIES:  is allergic to codeine.  MEDICATIONS:  Current Outpatient Medications  Medication Sig Dispense Refill   albuterol  (VENTOLIN  HFA) 108 (90 Base) MCG/ACT inhaler Inhale 2 puffs into the lungs once as needed for wheezing or shortness of breath (FOR GRADE 3, or 4 HYPERSENSITIVITY REACTIONS). 6.7 g 0   amitriptyline  (ELAVIL ) 10 MG tablet Take 10 mg by mouth at bedtime.     amLODipine  (NORVASC ) 10 MG tablet Take 1 tablet (10 mg total) by mouth daily. 90 tablet 1   aspirin  EC 81 MG tablet Take 81 mg by mouth daily.     atorvastatin  (LIPITOR) 40 MG tablet Take 40 mg by mouth at bedtime.     Calcium  Carb-Cholecalciferol 600-10 MG-MCG TABS Take 1 tablet by mouth 2 (two) times daily.     FLUoxetine  (PROZAC ) 20 MG capsule Take 20 mg by mouth daily.     folic acid  (FOLVITE ) 1 MG tablet Take 1 tablet (1 mg total) by mouth daily. 90 tablet 1   loratadine  (CLARITIN ) 10 MG tablet Take 10 mg by mouth daily.     mycophenolate (CELLCEPT) 500 MG tablet Take 1 tablet (500 mg total) by mouth 2 (two) times daily. 60 tablet 0   omeprazole (PRILOSEC) 20 MG capsule Take 20 mg by mouth daily.     predniSONE  (DELTASONE ) 20 MG tablet Take 2 tablets (40 mg total) by mouth daily with breakfast.     traZODone  (DESYREL ) 100 MG tablet Take 100 mg by mouth at bedtime.     No current facility-administered medications for this visit.   Facility-Administered Medications Ordered in Other Visits  Medication Dose Route Frequency Provider Last Rate Last Admin   0.9 %  sodium chloride  infusion   Intravenous Continuous Dasie Tinnie MATSU, NP       acetaminophen   (TYLENOL ) tablet 650 mg  650 mg Oral Once Allen, Lauren G, NP       dexamethasone  (DECADRON ) injection 10 mg  10 mg Intravenous Once Allen, Lauren G, NP       diphenhydrAMINE  (BENADRYL ) capsule 50 mg  50 mg Oral Once Allen, Lauren G, NP       riTUXimab -pvvr (RUXIENCE ) 800 mg in sodium chloride  0.9 % 250 mL (2.4242 mg/mL) infusion  375 mg/m2 (Treatment Plan Recorded) Intravenous Once Dasie Tinnie MATSU, NP        PHYSICAL EXAMINATION:   Vitals:   01/18/24 0834  BP: 136/64  Pulse: 89  Resp: 20  Temp: 97.8 F (36.6 C)  SpO2: 100%   Filed Weights   01/18/24 0834  Weight: 188 lb 11.2 oz (85.6 kg)  Physical Exam Vitals and nursing note reviewed.  HENT:     Head: Normocephalic and atraumatic.     Mouth/Throat:     Pharynx: Oropharynx is clear.  Eyes:     Extraocular Movements: Extraocular movements intact.     Pupils: Pupils are equal, round, and reactive to light.  Cardiovascular:     Rate and Rhythm: Normal rate and regular rhythm.  Pulmonary:     Comments: Decreased breath sounds bilaterally.  Abdominal:     Palpations: Abdomen is soft.  Musculoskeletal:        General: Normal range of motion.     Cervical back: Normal range of motion.  Skin:    General: Skin is warm.  Neurological:     General: No focal deficit present.     Mental Status: He is alert and oriented to person, place, and time.  Psychiatric:        Behavior: Behavior normal.        Judgment: Judgment normal.     LABORATORY DATA:  I have reviewed the data as listed Lab Results  Component Value Date   WBC 4.3 01/18/2024   HGB 8.7 (L) 01/18/2024   HCT 24.4 (L) 01/18/2024   MCV 99.2 01/18/2024   PLT 63 (L) 01/18/2024   Recent Labs    12/12/23 1656 12/13/23 0635 12/26/23 0800 12/29/23 1309 01/04/24 0816 01/12/24 0910 01/18/24 0826  NA  --    < >  --    < > 135 138 139  K  --    < >  --    < > 3.1* 3.2* 3.4*  CL  --    < >  --    < > 105 107 108  CO2  --    < >  --    < > 24 24 22   GLUCOSE   --    < >  --    < > 111* 94 110*  BUN  --    < >  --    < > 24* 38* 27*  CREATININE  --    < >  --    < > 1.01 1.06 1.22  CALCIUM   --    < >  --    < > 9.2 9.4 8.9  GFRNONAA  --    < >  --    < > >60 >60 >60  PROT  --    < >  --    < > 5.5* 5.4* 4.8*  ALBUMIN  --    < >  --    < > 3.3* 3.2* 2.7*  AST  --    < >  --    < > 31 36 39  ALT  --    < >  --    < > 47* 48* 53*  ALKPHOS  --    < >  --    < > 131* 94 80  BILITOT 4.1*   < > 1.4*   < > 3.7* 3.3* 3.3*  BILIDIR 0.9*  --  0.5*  --   --   --   --   IBILI 3.2*  --  0.9  --   --   --   --    < > = values in this interval not displayed.    RADIOGRAPHIC STUDIES: I have personally reviewed the radiological images as listed and agreed with the findings in the report. CT Head Wo Contrast Result Date: 12/23/2023  CLINICAL DATA:  Pain and confusion. 3 day history of weakness. Pancytopenia. EXAM: CT HEAD WITHOUT CONTRAST TECHNIQUE: Contiguous axial images were obtained from the base of the skull through the vertex without intravenous contrast. RADIATION DOSE REDUCTION: This exam was performed according to the departmental dose-optimization program which includes automated exposure control, adjustment of the mA and/or kV according to patient size and/or use of iterative reconstruction technique. COMPARISON:  None Available. FINDINGS: Brain: There is no evidence for acute hemorrhage, hydrocephalus, mass lesion, or abnormal extra-axial fluid collection. No definite CT evidence for acute infarction. Diffuse loss of parenchymal volume is consistent with atrophy. Patchy low attenuation in the deep hemispheric and periventricular white matter is nonspecific, but likely reflects chronic microvascular ischemic demyelination. Vascular: No hyperdense vessel or unexpected calcification. Skull: No evidence for fracture. No worrisome lytic or sclerotic lesion. Sinuses/Orbits: Left mastoid effusion evident. Visualized paranasal sinuses are clear. Visualized portions of  the globes and intraorbital fat are unremarkable. Other: None. IMPRESSION: 1. No acute intracranial abnormality. 2. Atrophy with chronic small vessel ischemic disease. 3. Left mastoid effusion. Electronically Signed   By: Camellia Candle M.D.   On: 12/23/2023 08:09   DG Chest Port 1 View Result Date: 12/23/2023 EXAM: PA AND LATERAL (2 VIEWS) XRAY OF THE CHEST 12/23/2023 07:28:41 AM COMPARISON: PA and lateral radiographs of chest dated 12/12/2023. CLINICAL HISTORY: 141835 Weakness 141835. Weakness. FINDINGS: LUNGS AND PLEURA: No focal pulmonary opacity. No pulmonary edema. No pleural effusion. No pneumothorax. HEART AND MEDIASTINUM: No acute abnormality of the cardiac and mediastinal silhouettes. 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 IMPRESSION: 1. No acute process. Electronically signed by: Evalene Coho MD 12/23/2023 07:59 AM EDT RP Workstation: [id number]     Warm autoimmune hemolytic anemia (HCC) # AUG 2025- [[facility]] -Predominant hemolytic anemia DAT positive warm mild thrombocytopenia--s/p 1u pRBC for Hgb 6.1-patient then --received IV solumedrol for 3 days and discharged on prednisone  80 mg daily. CT scan abdomen pelvis no evidence of any-malignancy noted.  CT chest unremarkable.   # Currently on prednisone  40 mg a day,  hemoglobin overall improved but today trending lower  from last week.  Hb 8.7- SINCE, no significant improvement noted plan adding  mycophenolate mofetil (MMF) starting at a dose of 500 mg every 12 hours.  Consider increasing the dose to 1000 mg twice a day if hemolysis labs trending down. .Continue folic acid . Given REMS- discussed with pharmacy.   # proceed with 4/4 planned weekly rituximab  infusion today.   # Confusion question steroid psychosis-recommend tapering the dose of steroids and monitor closely.  # DISPOSITION: # rituxan  today #  in 2 weeks- follow up as with APP- labs-cbc/LDH; CMP;HOLD tube-  D-2 Possible 1unit blood- # Follow up in 4 weeks- MD- labs-cbc/LDH; CMP;HOLD tube- D-2 Possible 1unit blood-- Dr.B    Above plan of care was discussed with patient/family in detail.  My contact information was given to the patient/family.       Cindy JONELLE Joe, MD 01/18/2024 10:01 AM

## 2024-01-18 NOTE — Progress Notes (Signed)
 Patient states he Is feeling weak and fatigued all the time.

## 2024-01-18 NOTE — Progress Notes (Signed)
 [facility] CSW Progress Note  Visual merchandiser met with patient in infusion to follow-up on advance directives questions.    Interventions: Provided education and assistance to client regarding Advanced Directives. Left a voicemail with patient's son, Curtistine to further discuss.      Follow Up Plan:  CSW will follow-up with patient by phone     Macario CHRISTELLA Au, LCSW Clinical Social Worker [facility]

## 2024-01-18 NOTE — Assessment & Plan Note (Addendum)
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 CT scan abdomen pelvis no evidence of any-malignancy noted.  CT chest unremarkable.   # Currently on prednisone  40 mg a day,  hemoglobin overall improved but today trending lower  from last week.  Hb 8.7- SINCE, no significant improvement noted plan adding  mycophenolate mofetil (MMF) starting at a dose of 500 mg every 12 hours.  Consider increasing the dose to 1000 mg twice a day if hemolysis labs trending down. .Continue folic acid . Given REMS- discussed with pharmacy.   # proceed with 4/4 planned weekly rituximab  infusion today.   # Confusion question steroid psychosis-recommend tapering the dose of steroids and monitor closely.  # DISPOSITION: # rituxan  today #  in 2 weeks- follow up as with APP- labs-cbc/LDH; CMP;HOLD tube- D-2 Possible 1unit blood- # Follow up in 4 weeks- MD- labs-cbc/LDH; CMP;HOLD tube- D-2 Possible 1unit blood-- Dr.B

## 2024-01-18 NOTE — Patient Instructions (Signed)
 [facility] - A DEPT OF [facility]  Discharge Instructions: Thank you for choosing [facility] to provide your oncology and hematology care.  If you have a lab appointment with the Cancer Center, please go directly to the Cancer Center and check in at the registration area.  Wear comfortable clothing and clothing appropriate for easy access to any Portacath or PICC line.   We strive to give you quality time with your provider. You may need to reschedule your appointment if you arrive late (15 or more minutes).  Arriving late affects you and other patients whose appointments are after yours.  Also, if you miss three or more appointments without notifying the office, you may be dismissed from the clinic at the provider's discretion.      For prescription refill requests, have your pharmacy contact our office and allow 72 hours for refills to be completed.    Today you received the following chemotherapy and/or immunotherapy agents Rituxan.      To help prevent nausea and vomiting after your treatment, we encourage you to take your nausea medication as directed.  BELOW ARE SYMPTOMS THAT SHOULD BE REPORTED IMMEDIATELY: *FEVER GREATER THAN 100.4 F (38 C) OR HIGHER *CHILLS OR SWEATING *NAUSEA AND VOMITING THAT IS NOT CONTROLLED WITH YOUR NAUSEA MEDICATION *UNUSUAL SHORTNESS OF BREATH *UNUSUAL BRUISING OR BLEEDING *URINARY PROBLEMS (pain or burning when urinating, or frequent urination) *BOWEL PROBLEMS (unusual diarrhea, constipation, pain near the anus) TENDERNESS IN MOUTH AND THROAT WITH OR WITHOUT PRESENCE OF ULCERS (sore throat, sores in mouth, or a toothache) UNUSUAL RASH, SWELLING OR PAIN  UNUSUAL VAGINAL DISCHARGE OR ITCHING   Items with * indicate a potential emergency and should be followed up as soon as possible or go to the Emergency Department if any problems should occur.  Please show the CHEMOTHERAPY ALERT CARD or IMMUNOTHERAPY  ALERT CARD at check-in to the Emergency Department and triage nurse.  Should you have questions after your visit or need to cancel or reschedule your appointment, please contact [facility] - A DEPT OF [facility]  [phone] and follow the prompts.  Office hours are 8:00 a.m. to 4:30 p.m. Monday - Friday. Please note that voicemails left after 4:00 p.m. may not be returned until the following business day.  We are closed weekends and major holidays. You have access to a nurse at all times for urgent questions. Please call the main number to the clinic [phone] and follow the prompts.  For any non-urgent questions, you may also contact your provider using MyChart. We now offer e-Visits for anyone 67 and older to request care online for non-urgent symptoms. For details visit [email or web address].   Also download the MyChart app! Go to the app store, search "MyChart", open the app, select [place], and log in with your MyChart username and password.

## 2024-01-19 NOTE — Progress Notes (Signed)
 Left message for [name] to call our office.

## 2024-01-19 NOTE — Progress Notes (Signed)
 Mardelle Matte notified

## 2024-02-01 ENCOUNTER — Inpatient Hospital Stay

## 2024-02-01 ENCOUNTER — Encounter: Payer: Self-pay | Admitting: Nurse Practitioner

## 2024-02-01 ENCOUNTER — Telehealth: Payer: Self-pay | Admitting: *Deleted

## 2024-02-01 ENCOUNTER — Inpatient Hospital Stay: Admitting: Nurse Practitioner

## 2024-02-01 VITALS — BP 165/84 | HR 84 | Temp 98.4°F | Resp 20 | Ht 69.0 in | Wt 193.1 lb

## 2024-02-01 DIAGNOSIS — D5911 Warm autoimmune hemolytic anemia: Secondary | ICD-10-CM

## 2024-02-01 DIAGNOSIS — D649 Anemia, unspecified: Secondary | ICD-10-CM | POA: Diagnosis not present

## 2024-02-01 DIAGNOSIS — Z5112 Encounter for antineoplastic immunotherapy: Secondary | ICD-10-CM | POA: Diagnosis not present

## 2024-02-01 DIAGNOSIS — L03116 Cellulitis of left lower limb: Secondary | ICD-10-CM

## 2024-02-01 LAB — SAMPLE TO BLOOD BANK

## 2024-02-01 LAB — CMP (CANCER CENTER ONLY)
ALT: 40 U/L (ref 0–44)
AST: 41 U/L (ref 15–41)
Albumin: 3.2 g/dL — ABNORMAL LOW (ref 3.5–5.0)
Alkaline Phosphatase: 70 U/L (ref 38–126)
Anion gap: 10 (ref 5–15)
BUN: 29 mg/dL — ABNORMAL HIGH (ref 8–23)
CO2: 24 mmol/L (ref 22–32)
Calcium: 9.1 mg/dL (ref 8.9–10.3)
Chloride: 103 mmol/L (ref 98–111)
Creatinine: 1.13 mg/dL (ref 0.61–1.24)
GFR, Estimated: >60 mL/min (ref >60)
Glucose, Bld: 109 mg/dL — ABNORMAL HIGH (ref 70–99)
Potassium: 3.5 mmol/L (ref 3.5–5.1)
Sodium: 137 mmol/L (ref 135–145)
Total Bilirubin: 3.1 mg/dL — ABNORMAL HIGH (ref 0.0–1.2)
Total Protein: 5.2 g/dL — ABNORMAL LOW (ref 6.5–8.1)

## 2024-02-01 LAB — CBC WITH DIFFERENTIAL (CANCER CENTER ONLY)
Abs Immature Granulocytes: 0.15 K/uL — ABNORMAL HIGH (ref 0.00–0.07)
Basophils Absolute: 0 K/uL (ref 0.0–0.1)
Basophils Relative: 0 %
Eosinophils Absolute: 0 K/uL (ref 0.0–0.5)
Eosinophils Relative: 0 %
HCT: 25.6 % — ABNORMAL LOW (ref 39.0–52.0)
Hemoglobin: 8.8 g/dL — ABNORMAL LOW (ref 13.0–17.0)
Immature Granulocytes: 4 %
Lymphocytes Relative: 47 %
Lymphs Abs: 1.9 K/uL (ref 0.7–4.0)
MCH: 34.6 pg — ABNORMAL HIGH (ref 26.0–34.0)
MCHC: 34.4 g/dL (ref 30.0–36.0)
MCV: 100.8 fL — ABNORMAL HIGH (ref 80.0–100.0)
Monocytes Absolute: 0.2 K/uL (ref 0.1–1.0)
Monocytes Relative: 4 %
Neutro Abs: 1.8 K/uL (ref 1.7–7.7)
Neutrophils Relative %: 45 %
Platelet Count: 63 K/uL — ABNORMAL LOW (ref 150–400)
RBC: 2.54 MIL/uL — ABNORMAL LOW (ref 4.22–5.81)
RDW: 18.6 % — ABNORMAL HIGH (ref 11.5–15.5)
WBC Count: 4 K/uL (ref 4.0–10.5)
nRBC: 0 % (ref 0.0–0.2)

## 2024-02-01 LAB — LACTATE DEHYDROGENASE: LDH: 340 U/L — ABNORMAL HIGH (ref 98–192)

## 2024-02-01 MED ORDER — AMOXICILLIN-POT CLAVULANATE 875-125 MG PO TABS: 1.0000 | ORAL_TABLET | Freq: Two times a day (BID) | ORAL | 0 refills | Status: AC

## 2024-02-01 NOTE — Progress Notes (Signed)
 [facility] CONSULT NOTE  Patient Care Team: Center, [facility] as PCP - General (General Practice) Rennie Brian SAUNDERS, MD as Consulting Physician (Oncology)  CHIEF COMPLAINTS/PURPOSE OF CONSULTATION: Hemolytic anemia  Oncology History   No history exists.    HISTORY OF PRESENTING ILLNESS: Patient ambulating- in wheel chair. Accompanied by son  Brian Montes 78 y.o.  male pleasant patient with history of hyponatremia, NASH, newly diagnosed autoimmune hemolytic anemia, status post prednisone  and rituximab , who returns to clinic for follow-up.  His last dose of rituximab  was on 01/18/2024.  He is currently taking 40 mg of prednisone  daily. Complains of leg weeping fluid for past few days. Fatigued but otherwise denies complaints.   Review of Systems  Constitutional:  Positive for malaise/fatigue. Negative for chills, diaphoresis, fever and weight loss.  HENT:  Negative for nosebleeds and sore throat.   Eyes:  Negative for double vision.  Respiratory:  Negative for cough, hemoptysis, sputum production, shortness of breath and wheezing.   Cardiovascular:  Negative for chest pain, palpitations, orthopnea and leg swelling.  Gastrointestinal:  Negative for abdominal pain, blood in stool, constipation, diarrhea, heartburn, melena, nausea and vomiting.  Genitourinary:  Negative for dysuria, frequency and urgency.  Musculoskeletal:  Positive for back pain and joint pain.  Skin: Negative.  Negative for itching and rash.  Neurological:  Negative for dizziness, tingling, focal weakness, weakness and headaches.  Endo/Heme/Allergies:  Does not bruise/bleed easily.  Psychiatric/Behavioral:  Negative for depression. The patient is not nervous/anxious and does not have insomnia.     MEDICAL HISTORY:  Past Medical History:  Diagnosis Date   Acid reflux    Calculus of gallbladder with acute and chronic cholecystitis without obstruction    Essential hypertension     Hyperlipidemia due to dietary fat intake     SURGICAL HISTORY: Past Surgical History:  Procedure Laterality Date   CARDIAC CATHETERIZATION  2010   Dr. Bosie - NORMAL CORONARIES   CHOLECYSTECTOMY N/A 11/02/2017   Procedure: LAPAROSCOPIC CHOLECYSTECTOMY;  Surgeon: [name], MD;  Location: [facility] ORS;  Service: General;  Laterality: N/A;   RIGHT COLECTOMY  2009   at [facility]    SOCIAL HISTORY: Social History   Socioeconomic History   Marital status: Married    Spouse name: Not on file   Number of children: Not on file   Years of education: Not on file   Highest education level: Not on file  Occupational History   Not on file  Tobacco Use   Smoking status: Former   Smokeless tobacco: Current    Types: Chew  Vaping Use   Vaping status: Never Used  Substance and Sexual Activity   Alcohol use: Not Currently   Drug use: Never   Sexual activity: Not on file  Other Topics Concern   Not on file  Social History Narrative   Not on file   Social Drivers of Health   Financial Resource Strain: Not on file  Food Insecurity: No Food Insecurity (12/23/2023)   Hunger Vital Sign    Worried About Running Out of Food in the Last Year: Never true    Ran Out of Food in the Last Year: Never true  Transportation Needs: No Transportation Needs (12/23/2023)   PRAPARE - Administrator, Civil Service (Medical): No    Lack of Transportation (Non-Medical): No  Physical Activity: Not on file  Stress: Not on file  Social Connections: Unknown (12/23/2023)   Social Connection and Isolation  Panel    Frequency of Communication with Friends and Family: More than three times a week    Frequency of Social Gatherings with Friends and Family: More than three times a week    Attends Religious Services: Patient declined    Active Member of Clubs or Organizations: Patient declined    Attends Banker Meetings: Patient declined    Marital Status: Patient declined  Intimate Partner  Violence: Not At Risk (12/23/2023)   Humiliation, Afraid, Rape, and Kick questionnaire    Fear of Current or Ex-Partner: No    Emotionally Abused: No    Physically Abused: No    Sexually Abused: No    FAMILY HISTORY: Family History  Problem Relation Age of Onset   CAD Father    Diabetes Brother    Bladder Cancer Other     ALLERGIES:  is allergic to codeine.  MEDICATIONS:  Current Outpatient Medications  Medication Sig Dispense Refill   albuterol  (VENTOLIN  HFA) 108 (90 Base) MCG/ACT inhaler Inhale 2 puffs into the lungs once as needed for wheezing or shortness of breath (FOR GRADE 3, or 4 HYPERSENSITIVITY REACTIONS). 6.7 g 0   amitriptyline  (ELAVIL ) 10 MG tablet Take 10 mg by mouth at bedtime.     amLODipine  (NORVASC ) 10 MG tablet Take 1 tablet (10 mg total) by mouth daily. 90 tablet 1   aspirin  EC 81 MG tablet Take 81 mg by mouth daily.     atorvastatin  (LIPITOR) 40 MG tablet Take 40 mg by mouth at bedtime.     Calcium  Carb-Cholecalciferol 600-10 MG-MCG TABS Take 1 tablet by mouth 2 (two) times daily.     FLUoxetine  (PROZAC ) 20 MG capsule Take 20 mg by mouth daily.     folic acid  (FOLVITE ) 1 MG tablet Take 1 tablet (1 mg total) by mouth daily. 90 tablet 1   loratadine  (CLARITIN ) 10 MG tablet Take 10 mg by mouth daily.     mycophenolate (CELLCEPT) 500 MG tablet Take 1 tablet (500 mg total) by mouth 2 (two) times daily. 60 tablet 0   omeprazole (PRILOSEC) 20 MG capsule Take 20 mg by mouth daily.     predniSONE  (DELTASONE ) 20 MG tablet Take 2 tablets (40 mg total) by mouth daily with breakfast.     thiamine (VITAMIN B-1) 100 MG tablet Take 100 mg by mouth daily.     traZODone  (DESYREL ) 100 MG tablet Take 100 mg by mouth at bedtime.     No current facility-administered medications for this visit.    PHYSICAL EXAMINATION: Vitals:   02/01/24 1459  BP: (!) 169/86  Pulse: 84  Resp: 20  Temp: 98.4 F (36.9 C)  SpO2: 100%   Filed Weights   02/01/24 1459  Weight: 193 lb 1.6 oz  (87.6 kg)   Physical Exam Vitals reviewed.  HENT:     Head: Normocephalic and atraumatic.     Mouth/Throat:     Pharynx: Oropharynx is clear.  Cardiovascular:     Rate and Rhythm: Normal rate and regular rhythm.  Pulmonary:     Comments: Decreased breath sounds bilaterally.  Abdominal:     General: There is no distension.     Palpations: Abdomen is soft.  Musculoskeletal:     Right lower leg: Edema present.     Left lower leg: Edema (fluid leaking) present.  Skin:    General: Skin is warm.     Coloration: Skin is not pale.  Neurological:     Mental Status: He is alert  and oriented to person, place, and time.  Psychiatric:        Mood and Affect: Mood normal.        Behavior: Behavior normal.      LABORATORY DATA:  I have reviewed the data as listed Lab Results  Component Value Date   WBC 4.0 02/01/2024   HGB 8.8 (L) 02/01/2024   HCT 25.6 (L) 02/01/2024   MCV 100.8 (H) 02/01/2024   PLT 63 (L) 02/01/2024   Recent Labs    12/12/23 1656 12/13/23 0635 12/26/23 0800 12/29/23 1309 01/12/24 0910 01/18/24 0826 02/01/24 1509  NA  --    < >  --    < > 138 139 137  K  --    < >  --    < > 3.2* 3.4* 3.5  CL  --    < >  --    < > 107 108 103  CO2  --    < >  --    < > 24 22 24   GLUCOSE  --    < >  --    < > 94 110* 109*  BUN  --    < >  --    < > 38* 27* 29*  CREATININE  --    < >  --    < > 1.06 1.22 1.13  CALCIUM   --    < >  --    < > 9.4 8.9 9.1  GFRNONAA  --    < >  --    < > >60 >60 >60  PROT  --    < >  --    < > 5.4* 4.8* 5.2*  ALBUMIN  --    < >  --    < > 3.2* 2.7* 3.2*  AST  --    < >  --    < > 36 39 41  ALT  --    < >  --    < > 48* 53* 40  ALKPHOS  --    < >  --    < > 94 80 70  BILITOT 4.1*   < > 1.4*   < > 3.3* 3.3* 3.1*  BILIDIR 0.9*  --  0.5*  --   --   --   --   IBILI 3.2*  --  0.9  --   --   --   --    < > = values in this interval not displayed.   Component Ref Range & Units (hover) 15:09 (02/01/24) 2 wk ago (01/18/24) 2 wk ago (01/12/24) 4  wk ago (01/04/24) 1 mo ago (12/29/23) 1 mo ago (12/26/23) 1 mo ago (12/25/23)  LDH 340 High  259 High  CM 264 High  CM 204 High  CM 207 High  CM 221 High  CM 178 CM    RADIOGRAPHIC STUDIES: I have personally reviewed the radiological images as listed and agreed with the findings in the report. No results found.   Assessment and plan:  No problem-specific Assessment & Plan notes found for this encounter.  Warm autoimmune hemolytic anemia  # AUG 2025- [[facility]] -Predominant hemolytic anemia DAT positive warm mild thrombocytopenia--s/p 1u pRBC for Hgb 6.1-patient then --received IV solumedrol for 3 days and discharged on prednisone  80 mg daily. CT scan abdomen pelvis no evidence of any-malignancy noted. CT chest unremarkable.    # Currently on prednisone  40 mg a day.  His hemoglobin is stable.  LDH is up to 340.  His bilirubin has improved.  I suspect he has some degree of ongoing hemolysis but unable to determine if secondary to his hemolytic anemia versus infection/cellulitis.  Plan to continue prednisone  at current dose.  Continue CellCept at current dose.  May consider increasing the dose to 1000 mg twice a day  in the future.  H  # Cellulitis-weeping edema and redness worse of the left lower extremity.  Recommend starting Augmentin twice a day.  Hold off on changing CellCept dose given immunosuppression.  Will send order to home health for Unna boots.  Plan for him to return to clinic next week for reevaluation.  I discussed this case with Dr. Melanee today.   # Confusion - question steroid psychosis.  We have been slowly tapering his steroids but will continue dose of 40 mg a day for now.  Mentation is now at baseline.   # DISPOSITION: No transfusion Next wednesday- lab, see Josh or Dr Melanee for cellulitis f/u F/u as scheduled- la   Above plan of care was discussed with patient/family in detail.  My contact information was given to the patient/family.   Tinnie KANDICE Dawn, NP 02/01/2024

## 2024-02-01 NOTE — Addendum Note (Signed)
 Addended by: JOSHUA ALFONSO CROME on: 02/01/2024 04:57 PM   Modules accepted: Orders

## 2024-02-01 NOTE — Progress Notes (Signed)
 Lt leg and foot swelling, leaking fluid x4 days, possibly longer.  Can he have a pepsi occasionally or tea?

## 2024-02-01 NOTE — Telephone Encounter (Signed)
 I spoke to his son and he is supposed to come here today  October 16  here at 2:45 to have labs and then see the nurse practitioner and if he needs a blood transfusion he would come back on the 17th at 12:30.  The son now knows

## 2024-02-01 NOTE — Addendum Note (Signed)
 Addended by: JOSHUA ALFONSO CROME on: 02/01/2024 04:18 PM   Modules accepted: Orders

## 2024-02-02 ENCOUNTER — Inpatient Hospital Stay

## 2024-02-07 ENCOUNTER — Inpatient Hospital Stay: Admitting: Hospice and Palliative Medicine

## 2024-02-07 ENCOUNTER — Other Ambulatory Visit: Payer: Self-pay

## 2024-02-07 VITALS — BP 166/90 | HR 94 | Temp 99.3°F | Resp 18 | Ht 69.0 in | Wt 193.5 lb

## 2024-02-07 DIAGNOSIS — Z5112 Encounter for antineoplastic immunotherapy: Secondary | ICD-10-CM | POA: Diagnosis not present

## 2024-02-07 DIAGNOSIS — L03116 Cellulitis of left lower limb: Secondary | ICD-10-CM | POA: Diagnosis not present

## 2024-02-07 DIAGNOSIS — R600 Localized edema: Secondary | ICD-10-CM

## 2024-02-07 DIAGNOSIS — D5911 Warm autoimmune hemolytic anemia: Secondary | ICD-10-CM

## 2024-02-07 NOTE — Progress Notes (Signed)
 Symptom Management Clinic [facility] Telephone:(336) [phone] Fax:(336) [phone]  Patient Care Team: Center, [facility] as PCP - General (General Practice) [name], MD as Consulting Physician (Oncology)   NAME OF PATIENT: Brian Montes  [id number]  Jun 06, 1945   DATE OF VISIT: 02/07/24  REASON FOR CONSULT: Brian Montes is a 78 y.o. male with multiple medical problems including warm autoimmune hemolytic anemia currently on treatment with prednisone .   INTERVAL HISTORY: Patient was seen on 02/01/2024 at which time he was having significant weeping and redness of the left lower extremity.  He was started on Augmentin for treatment of possible cellulitis.  Patient returns to clinic today for follow-up.  Patient feels like his swelling and redness have been improving.  He has not had any additional weeping.  Home health has been following and applying Unna boots.  Denies any neurologic complaints. Denies recent fevers or illnesses. Denies any easy bleeding or bruising. Reports good appetite and denies weight loss. Denies chest pain. Denies any nausea, vomiting, constipation, or diarrhea. Denies urinary complaints. Patient offers no further specific complaints today.   PAST MEDICAL HISTORY: Past Medical History:  Diagnosis Date   Acid reflux    Calculus of gallbladder with acute and chronic cholecystitis without obstruction    Essential hypertension    Hyperlipidemia due to dietary fat intake     PAST SURGICAL HISTORY:  Past Surgical History:  Procedure Laterality Date   CARDIAC CATHETERIZATION  2010   Dr. Bosie - NORMAL CORONARIES   CHOLECYSTECTOMY N/A 11/02/2017   Procedure: LAPAROSCOPIC CHOLECYSTECTOMY;  Surgeon: [name], MD;  Location: [facility] ORS;  Service: General;  Laterality: N/A;   RIGHT COLECTOMY  2009   at [facility]    HEMATOLOGY/ONCOLOGY HISTORY:  Oncology History   No history exists.    ALLERGIES:   is allergic to codeine.  MEDICATIONS:  Current Outpatient Medications  Medication Sig Dispense Refill   albuterol  (VENTOLIN  HFA) 108 (90 Base) MCG/ACT inhaler Inhale 2 puffs into the lungs once as needed for wheezing or shortness of breath (FOR GRADE 3, or 4 HYPERSENSITIVITY REACTIONS). 6.7 g 0   amitriptyline  (ELAVIL ) 10 MG tablet Take 10 mg by mouth at bedtime.     amLODipine  (NORVASC ) 10 MG tablet Take 1 tablet (10 mg total) by mouth daily. 90 tablet 1   amoxicillin-clavulanate (AUGMENTIN) 875-125 MG tablet Take 1 tablet by mouth 2 (two) times daily for 7 days. 14 tablet 0   aspirin  EC 81 MG tablet Take 81 mg by mouth daily.     atorvastatin  (LIPITOR) 40 MG tablet Take 40 mg by mouth at bedtime.     Calcium  Carb-Cholecalciferol 600-10 MG-MCG TABS Take 1 tablet by mouth 2 (two) times daily.     FLUoxetine  (PROZAC ) 20 MG capsule Take 20 mg by mouth daily.     folic acid  (FOLVITE ) 1 MG tablet Take 1 tablet (1 mg total) by mouth daily. 90 tablet 1   loratadine  (CLARITIN ) 10 MG tablet Take 10 mg by mouth daily.     mycophenolate (CELLCEPT) 500 MG tablet Take 1 tablet (500 mg total) by mouth 2 (two) times daily. 60 tablet 0   omeprazole (PRILOSEC) 20 MG capsule Take 20 mg by mouth daily.     predniSONE  (DELTASONE ) 20 MG tablet Take 2 tablets (40 mg total) by mouth daily with breakfast.     thiamine (VITAMIN B-1) 100 MG tablet Take 100 mg by mouth daily.  traZODone  (DESYREL ) 100 MG tablet Take 100 mg by mouth at bedtime.     No current facility-administered medications for this visit.    VITAL SIGNS: BP (!) 166/93 Comment: recheck. pt advised to go to pcp if bp continues to elevated. pt took bp meds at home. pt advised to monitor bp at home as well and take meds as directed.  Pulse 94   Temp 99.3 F (37.4 C) (Oral)   Resp 18   Ht 5' 9 (1.753 m)   Wt 193 lb 8 oz (87.8 kg)   BMI 28.57 kg/m  Filed Weights   02/07/24 1350  Weight: 193 lb 8 oz (87.8 kg)    Estimated body mass index  is 28.57 kg/m as calculated from the following:   Height as of this encounter: 5' 9 (1.753 m).   Weight as of this encounter: 193 lb 8 oz (87.8 kg).  LABS: CBC:    Component Value Date/Time   WBC 4.0 02/01/2024 1509   WBC 4.2 01/04/2024 0816   HGB 8.8 (L) 02/01/2024 1509   HGB 15.5 11/18/2011 1438   HCT 25.6 (L) 02/01/2024 1509   HCT 44.2 11/18/2011 1438   PLT 63 (L) 02/01/2024 1509   PLT 196 11/18/2011 1438   MCV 100.8 (H) 02/01/2024 1509   MCV 92 11/18/2011 1438   NEUTROABS 1.8 02/01/2024 1509   LYMPHSABS 1.9 02/01/2024 1509   MONOABS 0.2 02/01/2024 1509   EOSABS 0.0 02/01/2024 1509   BASOSABS 0.0 02/01/2024 1509   Comprehensive Metabolic Panel:    Component Value Date/Time   NA 137 02/01/2024 1509   NA 143 11/18/2011 1438   K 3.5 02/01/2024 1509   K 3.9 11/18/2011 1438   CL 103 02/01/2024 1509   CL 104 11/18/2011 1438   CO2 24 02/01/2024 1509   CO2 29 11/18/2011 1438   BUN 29 (H) 02/01/2024 1509   BUN 16 11/18/2011 1438   CREATININE 1.13 02/01/2024 1509   CREATININE 1.19 11/18/2011 1438   GLUCOSE 109 (H) 02/01/2024 1509   GLUCOSE 138 (H) 11/18/2011 1438   CALCIUM  9.1 02/01/2024 1509   CALCIUM  9.4 11/18/2011 1438   AST 41 02/01/2024 1509   ALT 40 02/01/2024 1509   ALT 39 11/18/2011 1438   ALKPHOS 70 02/01/2024 1509   ALKPHOS 172 (H) 11/18/2011 1438   BILITOT 3.1 (H) 02/01/2024 1509   PROT 5.2 (L) 02/01/2024 1509   PROT 7.9 11/18/2011 1438   ALBUMIN 3.2 (L) 02/01/2024 1509   ALBUMIN 4.2 11/18/2011 1438    RADIOGRAPHIC STUDIES: No results found.  PERFORMANCE STATUS (ECOG) : 1 - Symptomatic but completely ambulatory  Review of Systems Unless otherwise noted, a complete review of systems is negative.  Physical Exam General: NAD Cardiovascular: regular rate and rhythm Pulmonary: clear ant fields Abdomen: soft, nontender, + bowel sounds GU: no suprapubic tenderness Extremities: BLE edema, erythema/petechiae left leg, no joint deformities Skin: no  rashes Neurological: Weakness but otherwise nonfocal    IMPRESSION/PLAN: Warm autoimmune hemolytic anemia -on prednisone  and CellCept  Cellulitis -improving.  Continue course of Augmentin.  Continue home health.  Will refer to vascular given chronic edema.  Follow-up with APP next week  Patient expressed understanding and was in agreement with this plan. He also understands that He can call clinic at any time with any questions, concerns, or complaints.   Thank you for allowing me to participate in the care of this very pleasant patient.   Time Total: 15 minutes  Visit consisted of  counseling and education dealing with the complex and emotionally intense issues of symptom management in the setting of serious illness.Greater than 50%  of this time was spent counseling and coordinating care related to the above assessment and plan.  Signed by: Fonda Mower, PhD, NP-C

## 2024-02-09 ENCOUNTER — Other Ambulatory Visit: Payer: Self-pay | Admitting: Internal Medicine

## 2024-02-13 ENCOUNTER — Encounter: Payer: Self-pay | Admitting: Internal Medicine

## 2024-02-16 ENCOUNTER — Encounter: Payer: Self-pay | Admitting: Nurse Practitioner

## 2024-02-16 ENCOUNTER — Inpatient Hospital Stay: Admitting: Nurse Practitioner

## 2024-02-16 VITALS — BP 151/89 | HR 88 | Temp 98.0°F | Resp 18 | Wt 194.4 lb

## 2024-02-16 DIAGNOSIS — D5911 Warm autoimmune hemolytic anemia: Secondary | ICD-10-CM | POA: Diagnosis not present

## 2024-02-16 DIAGNOSIS — L03116 Cellulitis of left lower limb: Secondary | ICD-10-CM | POA: Diagnosis not present

## 2024-02-16 DIAGNOSIS — Z5112 Encounter for antineoplastic immunotherapy: Secondary | ICD-10-CM | POA: Diagnosis not present

## 2024-02-16 NOTE — Progress Notes (Signed)
 Pt in for follow up, reports left leg improved.  No longer having any weeping edema.  Home health continues to manage as well as physical therapy at home. Denies any pain or concerns.

## 2024-02-16 NOTE — Progress Notes (Signed)
 Symptom Management Clinic [facility] at [facility] Telephone:(336) [phone] Fax:(336) [phone]  Patient Care Team: Center, [facility] as PCP - General (General Practice) [name], MD as Consulting Physician (Oncology)   NAME OF PATIENT: Brian Montes  [id number]  11/14/1945   DATE OF VISIT: 02/16/24  REASON FOR CONSULT: DELNO Montes is a 78 y.o. male with multiple medical problems including warm autoimmune hemolytic anemia currently on treatment with prednisone .  Follow up today for recent weeping cellulitis to LLE.     INTERVAL HISTORY: Patient was seen on 02/01/2024 at which time he was having significant weeping and redness of the left lower extremity.  He was started on Augmentin for treatment of possible cellulitis.  Last week he was seen in clinic weeping had resolved redness was resolving.  He was instructed to continue with Augmentin and referral was made for vascular at that time.    Patient returns to clinic today for follow-up walking with a cane.  Symptoms resolving, no longer weeping, edema improved.  No fever/chills.  Home health continues to visit.   Denies any neurologic complaints. Denies recent fevers or illnesses. Denies any easy bleeding or bruising. Reports good appetite and denies weight loss. Denies chest pain. Denies any nausea, vomiting, constipation, or diarrhea. Denies urinary complaints. Patient offers no further specific complaints today.   PAST MEDICAL HISTORY: Past Medical History:  Diagnosis Date   Acid reflux    Calculus of gallbladder with acute and chronic cholecystitis without obstruction    Essential hypertension    Hyperlipidemia due to dietary fat intake     PAST SURGICAL HISTORY:  Past Surgical History:  Procedure Laterality Date   CARDIAC CATHETERIZATION  2010   Dr. Bosie - NORMAL CORONARIES   CHOLECYSTECTOMY N/A 11/02/2017   Procedure: LAPAROSCOPIC CHOLECYSTECTOMY;  Surgeon: [name], MD;  Location: [facility] ORS;  Service: General;  Laterality: N/A;   RIGHT COLECTOMY  2009   at [facility]    HEMATOLOGY/ONCOLOGY HISTORY:  Oncology History   No history exists.    ALLERGIES:  is allergic to codeine.  MEDICATIONS:  Current Outpatient Medications  Medication Sig Dispense Refill   albuterol  (VENTOLIN  HFA) 108 (90 Base) MCG/ACT inhaler Inhale 2 puffs into the lungs once as needed for wheezing or shortness of breath (FOR GRADE 3, or 4 HYPERSENSITIVITY REACTIONS). 6.7 g 0   amitriptyline  (ELAVIL ) 10 MG tablet Take 10 mg by mouth at bedtime.     amLODipine  (NORVASC ) 10 MG tablet Take 1 tablet (10 mg total) by mouth daily. 90 tablet 1   aspirin  EC 81 MG tablet Take 81 mg by mouth daily.     atorvastatin  (LIPITOR) 40 MG tablet Take 40 mg by mouth at bedtime.     Calcium  Carb-Cholecalciferol 600-10 MG-MCG TABS Take 1 tablet by mouth 2 (two) times daily.     FLUoxetine  (PROZAC ) 20 MG capsule Take 20 mg by mouth daily.     folic acid  (FOLVITE ) 1 MG tablet Take 1 tablet (1 mg total) by mouth daily. 90 tablet 1   loratadine  (CLARITIN ) 10 MG tablet Take 10 mg by mouth daily.     mycophenolate (CELLCEPT) 500 MG tablet TAKE 1 TABLET BY MOUTH TWICE A DAY 180 tablet 1   omeprazole (PRILOSEC) 20 MG capsule Take 20 mg by mouth daily.     predniSONE  (DELTASONE ) 20 MG tablet Take 2 tablets (40 mg total) by mouth daily with breakfast.     thiamine (VITAMIN B-1)  100 MG tablet Take 100 mg by mouth daily.     traZODone  (DESYREL ) 100 MG tablet Take 100 mg by mouth at bedtime.     No current facility-administered medications for this visit.    VITAL SIGNS: BP (!) 151/89 (BP Location: Left Arm, Patient Position: Sitting)   Pulse 88   Temp 98 F (36.7 C) (Tympanic)   Resp 18   Wt 194 lb 6.4 oz (88.2 kg)   SpO2 100%   BMI 28.71 kg/m  Filed Weights   02/16/24 1330  Weight: 194 lb 6.4 oz (88.2 kg)     Estimated body mass index is 28.71 kg/m as calculated from the following:   Height as of  02/07/24: 5' 9 (1.753 m).   Weight as of this encounter: 194 lb 6.4 oz (88.2 kg).  LABS: CBC:    Component Value Date/Time   WBC 4.0 02/01/2024 1509   WBC 4.2 01/04/2024 0816   HGB 8.8 (L) 02/01/2024 1509   HGB 15.5 11/18/2011 1438   HCT 25.6 (L) 02/01/2024 1509   HCT 44.2 11/18/2011 1438   PLT 63 (L) 02/01/2024 1509   PLT 196 11/18/2011 1438   MCV 100.8 (H) 02/01/2024 1509   MCV 92 11/18/2011 1438   NEUTROABS 1.8 02/01/2024 1509   LYMPHSABS 1.9 02/01/2024 1509   MONOABS 0.2 02/01/2024 1509   EOSABS 0.0 02/01/2024 1509   BASOSABS 0.0 02/01/2024 1509   Comprehensive Metabolic Panel:    Component Value Date/Time   NA 137 02/01/2024 1509   NA 143 11/18/2011 1438   K 3.5 02/01/2024 1509   K 3.9 11/18/2011 1438   CL 103 02/01/2024 1509   CL 104 11/18/2011 1438   CO2 24 02/01/2024 1509   CO2 29 11/18/2011 1438   BUN 29 (H) 02/01/2024 1509   BUN 16 11/18/2011 1438   CREATININE 1.13 02/01/2024 1509   CREATININE 1.19 11/18/2011 1438   GLUCOSE 109 (H) 02/01/2024 1509   GLUCOSE 138 (H) 11/18/2011 1438   CALCIUM  9.1 02/01/2024 1509   CALCIUM  9.4 11/18/2011 1438   AST 41 02/01/2024 1509   ALT 40 02/01/2024 1509   ALT 39 11/18/2011 1438   ALKPHOS 70 02/01/2024 1509   ALKPHOS 172 (H) 11/18/2011 1438   BILITOT 3.1 (H) 02/01/2024 1509   PROT 5.2 (L) 02/01/2024 1509   PROT 7.9 11/18/2011 1438   ALBUMIN 3.2 (L) 02/01/2024 1509   ALBUMIN 4.2 11/18/2011 1438    RADIOGRAPHIC STUDIES: No results found.  PERFORMANCE STATUS (ECOG) : 1 - Symptomatic but completely ambulatory  Review of Systems Unless otherwise noted, a complete review of systems is negative.  Physical Exam General: NAD Cardiovascular: regular rate and rhythm Pulmonary: clear ant fields Abdomen: soft, nontender, + bowel sounds GU: no suprapubic tenderness Extremities: BLE edema, erythema/petechiae left leg, no joint deformities Skin: no rashes Neurological: Weakness but otherwise nonfocal        IMPRESSION/PLAN: Warm autoimmune hemolytic anemia -on prednisone  and CellCept  Cellulitis -improving.  Completed course of Augmentin.  Continue home health.  Referred to vascular given chronic edema and discoloration he has an appointment on Tuesday.  Continue to closely monitor and elevate lower extremities.  Follow-up with labs and MD Monday as previously scheduled  Patient expressed understanding and was in agreement with this plan. He also understands that He can call clinic at any time with any questions, concerns, or complaints.   Thank you for allowing me to participate in the care of this very pleasant patient.  Time Total: 15 minutes  Signed by: Morna Husband AGNP-C

## 2024-02-19 ENCOUNTER — Inpatient Hospital Stay: Attending: Internal Medicine

## 2024-02-19 ENCOUNTER — Inpatient Hospital Stay (HOSPITAL_BASED_OUTPATIENT_CLINIC_OR_DEPARTMENT_OTHER): Admitting: Internal Medicine

## 2024-02-19 ENCOUNTER — Encounter: Payer: Self-pay | Admitting: Internal Medicine

## 2024-02-19 VITALS — BP 143/85 | HR 93 | Temp 97.3°F | Resp 20 | Ht 69.0 in | Wt 190.1 lb

## 2024-02-19 DIAGNOSIS — F17220 Nicotine dependence, chewing tobacco, uncomplicated: Secondary | ICD-10-CM | POA: Insufficient documentation

## 2024-02-19 DIAGNOSIS — D5911 Warm autoimmune hemolytic anemia: Secondary | ICD-10-CM

## 2024-02-19 DIAGNOSIS — R410 Disorientation, unspecified: Secondary | ICD-10-CM | POA: Insufficient documentation

## 2024-02-19 DIAGNOSIS — Z7952 Long term (current) use of systemic steroids: Secondary | ICD-10-CM | POA: Insufficient documentation

## 2024-02-19 DIAGNOSIS — R5383 Other fatigue: Secondary | ICD-10-CM | POA: Insufficient documentation

## 2024-02-19 DIAGNOSIS — Z8052 Family history of malignant neoplasm of bladder: Secondary | ICD-10-CM | POA: Insufficient documentation

## 2024-02-19 LAB — CMP (CANCER CENTER ONLY)
ALT: 28 U/L (ref 0–44)
AST: 30 U/L (ref 15–41)
Albumin: 3.3 g/dL — ABNORMAL LOW (ref 3.5–5.0)
Alkaline Phosphatase: 100 U/L (ref 38–126)
Anion gap: 12 (ref 5–15)
BUN: 15 mg/dL (ref 8–23)
CO2: 24 mmol/L (ref 22–32)
Calcium: 8.5 mg/dL — ABNORMAL LOW (ref 8.9–10.3)
Chloride: 101 mmol/L (ref 98–111)
Creatinine: 1.27 mg/dL — ABNORMAL HIGH (ref 0.61–1.24)
GFR, Estimated: 59 mL/min — ABNORMAL LOW (ref >60)
Glucose, Bld: 106 mg/dL — ABNORMAL HIGH (ref 70–99)
Potassium: 3.2 mmol/L — ABNORMAL LOW (ref 3.5–5.1)
Sodium: 137 mmol/L (ref 135–145)
Total Bilirubin: 2.5 mg/dL — ABNORMAL HIGH (ref 0.0–1.2)
Total Protein: 5.4 g/dL — ABNORMAL LOW (ref 6.5–8.1)

## 2024-02-19 LAB — SAMPLE TO BLOOD BANK

## 2024-02-19 LAB — CBC WITH DIFFERENTIAL (CANCER CENTER ONLY)
Abs Immature Granulocytes: 0.12 K/uL — ABNORMAL HIGH (ref 0.00–0.07)
Basophils Absolute: 0 K/uL (ref 0.0–0.1)
Basophils Relative: 0 %
Eosinophils Absolute: 0 K/uL (ref 0.0–0.5)
Eosinophils Relative: 0 %
HCT: 33 % — ABNORMAL LOW (ref 39.0–52.0)
Hemoglobin: 11.2 g/dL — ABNORMAL LOW (ref 13.0–17.0)
Immature Granulocytes: 5 %
Lymphocytes Relative: 37 %
Lymphs Abs: 0.9 K/uL (ref 0.7–4.0)
MCH: 34.1 pg — ABNORMAL HIGH (ref 26.0–34.0)
MCHC: 33.9 g/dL (ref 30.0–36.0)
MCV: 100.6 fL — ABNORMAL HIGH (ref 80.0–100.0)
Monocytes Absolute: 0.1 K/uL (ref 0.1–1.0)
Monocytes Relative: 4 %
Neutro Abs: 1.4 K/uL — ABNORMAL LOW (ref 1.7–7.7)
Neutrophils Relative %: 54 %
Platelet Count: 70 K/uL — ABNORMAL LOW (ref 150–400)
RBC: 3.28 MIL/uL — ABNORMAL LOW (ref 4.22–5.81)
RDW: 14.2 % (ref 11.5–15.5)
WBC Count: 2.5 K/uL — ABNORMAL LOW (ref 4.0–10.5)
nRBC: 0 % (ref 0.0–0.2)

## 2024-02-19 LAB — LACTATE DEHYDROGENASE: LDH: 299 U/L — ABNORMAL HIGH (ref 98–192)

## 2024-02-19 NOTE — Progress Notes (Signed)
 Seeing Dr. Marea tomorrow. Pt feels weak this morning. Had 3-4 diarrheas last night.

## 2024-02-19 NOTE — Patient Instructions (Signed)
#   Recommend taking prednisone  20 mg a day for the next 2 weeks; and then take half a pill-do not stop until further instructions.

## 2024-02-19 NOTE — Assessment & Plan Note (Addendum)
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 CT scan abdomen pelvis no evidence of any-malignancy noted.  CT chest unremarkable.   # Currently on prednisone  40 mg a day,  hemoglobin overall improved at 11 today. Continue mycophenolate mofetil (MMF) starting at a dose of 500 mg every 12 hours.  Consider increasing the dose to 1000 mg twice a day if hemolysis labs trending down. .Continue folic acid .  # Recommend taking prednisone  20 mg a day for the next 2 weeks; and then take half a pill-do not stop until further instructions.  # Bilateral leg swelling/weight gain sec to streoids-[also awaiting Dr.dew appts] taper as above.   # Confusion question steroid psychosis-recommend tapering the dose of steroids and monitor closely.  # DISPOSITION: # No blood tomorrow; cancel appts.  # Follow up in 5 weeks- MD- labs-cbc/LDH;retic count- CMP;HOLD tube- D-2 Possible 1unit blood-- Dr.B

## 2024-02-19 NOTE — Progress Notes (Signed)
 [facility] CONSULT NOTE  Patient Care Team: Center, [facility] as PCP - General (General Practice) [name], MD as Consulting Physician (Oncology)  CHIEF COMPLAINTS/PURPOSE OF CONSULTATION: Hemolytic anemia  Oncology History   No history exists.    HISTORY OF PRESENTING ILLNESS: Patient ambulating- in wheel chair. Accompanied by son  Brian Montes 78 y.o.  male pleasant patient with   with hx of hyponatremia, NASH-recently admitted to the hospital for acute onset of autoimmune hemolytic anemia-on prednisone - mycophenolate and rituximab  weekly x 4 [finished oct 3rd, 2025 ]is here for follow-up.  Pt feels weak this morning. Had 3-4 diarrheas last night. Improved today. Currently on prednisone  40 mg/day.  He admits to compliance taking the prednisone .   However, Patient has not had any further hospitalizations.  No further episodes of confusion.   Review of Systems  Constitutional:  Positive for malaise/fatigue. Negative for chills, diaphoresis, fever and weight loss.  HENT:  Negative for nosebleeds and sore throat.   Eyes:  Negative for double vision.  Respiratory:  Negative for cough, hemoptysis, sputum production, shortness of breath and wheezing.   Cardiovascular:  Negative for chest pain, palpitations, orthopnea and leg swelling.  Gastrointestinal:  Negative for abdominal pain, blood in stool, constipation, diarrhea, heartburn, melena, nausea and vomiting.  Genitourinary:  Negative for dysuria, frequency and urgency.  Musculoskeletal:  Positive for back pain and joint pain.  Skin: Negative.  Negative for itching and rash.  Neurological:  Negative for dizziness, tingling, focal weakness, weakness and headaches.  Endo/Heme/Allergies:  Does not bruise/bleed easily.  Psychiatric/Behavioral:  Negative for depression. The patient is not nervous/anxious and does not have insomnia.     MEDICAL HISTORY:  Past Medical History:  Diagnosis Date    Acid reflux    Calculus of gallbladder with acute and chronic cholecystitis without obstruction    Essential hypertension    Hyperlipidemia due to dietary fat intake     SURGICAL HISTORY: Past Surgical History:  Procedure Laterality Date   CARDIAC CATHETERIZATION  2010   Dr. Bosie - NORMAL CORONARIES   CHOLECYSTECTOMY N/A 11/02/2017   Procedure: LAPAROSCOPIC CHOLECYSTECTOMY;  Surgeon: [name], MD;  Location: [facility] ORS;  Service: General;  Laterality: N/A;   RIGHT COLECTOMY  2009   at [facility]    SOCIAL HISTORY: Social History   Socioeconomic History   Marital status: Married    Spouse name: Not on file   Number of children: Not on file   Years of education: Not on file   Highest education level: Not on file  Occupational History   Not on file  Tobacco Use   Smoking status: Former   Smokeless tobacco: Current    Types: Chew  Vaping Use   Vaping status: Never Used  Substance and Sexual Activity   Alcohol use: Not Currently   Drug use: Never   Sexual activity: Not on file  Other Topics Concern   Not on file  Social History Narrative   Not on file   Social Drivers of Health   Financial Resource Strain: Not on file  Food Insecurity: No Food Insecurity (12/23/2023)   Hunger Vital Sign    Worried About Running Out of Food in the Last Year: Never true    Ran Out of Food in the Last Year: Never true  Transportation Needs: No Transportation Needs (12/23/2023)   PRAPARE - Administrator, Civil Service (Medical): No    Lack of Transportation (Non-Medical): No  Physical Activity: Not on file  Stress: Not on file  Social Connections: Unknown (12/23/2023)   Social Connection and Isolation Panel    Frequency of Communication with Friends and Family: More than three times a week    Frequency of Social Gatherings with Friends and Family: More than three times a week    Attends Religious Services: Patient declined    Database Administrator or Organizations: Patient  declined    Attends Banker Meetings: Patient declined    Marital Status: Patient declined  Intimate Partner Violence: Not At Risk (12/23/2023)   Humiliation, Afraid, Rape, and Kick questionnaire    Fear of Current or Ex-Partner: No    Emotionally Abused: No    Physically Abused: No    Sexually Abused: No    FAMILY HISTORY: Family History  Problem Relation Age of Onset   CAD Father    Diabetes Brother    Bladder Cancer Other     ALLERGIES:  is allergic to codeine.  MEDICATIONS:  Current Outpatient Medications  Medication Sig Dispense Refill   albuterol  (VENTOLIN  HFA) 108 (90 Base) MCG/ACT inhaler Inhale 2 puffs into the lungs once as needed for wheezing or shortness of breath (FOR GRADE 3, or 4 HYPERSENSITIVITY REACTIONS). 6.7 g 0   amitriptyline  (ELAVIL ) 10 MG tablet Take 10 mg by mouth at bedtime.     amLODipine  (NORVASC ) 10 MG tablet Take 1 tablet (10 mg total) by mouth daily. 90 tablet 1   aspirin  EC 81 MG tablet Take 81 mg by mouth daily.     atorvastatin  (LIPITOR) 40 MG tablet Take 40 mg by mouth at bedtime.     Calcium  Carb-Cholecalciferol 600-10 MG-MCG TABS Take 1 tablet by mouth 2 (two) times daily.     FLUoxetine  (PROZAC ) 20 MG capsule Take 20 mg by mouth daily.     folic acid  (FOLVITE ) 1 MG tablet Take 1 tablet (1 mg total) by mouth daily. 90 tablet 1   loratadine  (CLARITIN ) 10 MG tablet Take 10 mg by mouth daily.     mycophenolate (CELLCEPT) 500 MG tablet TAKE 1 TABLET BY MOUTH TWICE A DAY 180 tablet 1   omeprazole (PRILOSEC) 20 MG capsule Take 20 mg by mouth daily.     predniSONE  (DELTASONE ) 20 MG tablet Take 2 tablets (40 mg total) by mouth daily with breakfast.     thiamine (VITAMIN B-1) 100 MG tablet Take 100 mg by mouth daily.     traZODone  (DESYREL ) 100 MG tablet Take 100 mg by mouth at bedtime.     No current facility-administered medications for this visit.    PHYSICAL EXAMINATION:   Vitals:   02/19/24 0950 02/19/24 1000  BP: (!) 153/95  (!) 143/85  Pulse: 93   Resp: 20   Temp: (!) 97.3 F (36.3 C)   SpO2: 100%    Filed Weights   02/19/24 0950  Weight: 190 lb 1.6 oz (86.2 kg)    Physical Exam Vitals and nursing note reviewed.  HENT:     Head: Normocephalic and atraumatic.     Mouth/Throat:     Pharynx: Oropharynx is clear.  Eyes:     Extraocular Movements: Extraocular movements intact.     Pupils: Pupils are equal, round, and reactive to light.  Cardiovascular:     Rate and Rhythm: Normal rate and regular rhythm.  Pulmonary:     Comments: Decreased breath sounds bilaterally.  Abdominal:     Palpations: Abdomen is soft.  Musculoskeletal:  General: Normal range of motion.     Cervical back: Normal range of motion.  Skin:    General: Skin is warm.  Neurological:     General: No focal deficit present.     Mental Status: He is alert and oriented to person, place, and time.  Psychiatric:        Behavior: Behavior normal.        Judgment: Judgment normal.     LABORATORY DATA:  I have reviewed the data as listed Lab Results  Component Value Date   WBC 2.5 (L) 02/19/2024   HGB 11.2 (L) 02/19/2024   HCT 33.0 (L) 02/19/2024   MCV 100.6 (H) 02/19/2024   PLT 70 (L) 02/19/2024   Recent Labs    12/12/23 1656 12/13/23 0635 12/26/23 0800 12/29/23 1309 01/12/24 0910 01/18/24 0826 02/01/24 1509  NA  --    < >  --    < > 138 139 137  K  --    < >  --    < > 3.2* 3.4* 3.5  CL  --    < >  --    < > 107 108 103  CO2  --    < >  --    < > 24 22 24   GLUCOSE  --    < >  --    < > 94 110* 109*  BUN  --    < >  --    < > 38* 27* 29*  CREATININE  --    < >  --    < > 1.06 1.22 1.13  CALCIUM   --    < >  --    < > 9.4 8.9 9.1  GFRNONAA  --    < >  --    < > >60 >60 >60  PROT  --    < >  --    < > 5.4* 4.8* 5.2*  ALBUMIN  --    < >  --    < > 3.2* 2.7* 3.2*  AST  --    < >  --    < > 36 39 41  ALT  --    < >  --    < > 48* 53* 40  ALKPHOS  --    < >  --    < > 94 80 70  BILITOT 4.1*   < > 1.4*   < >  3.3* 3.3* 3.1*  BILIDIR 0.9*  --  0.5*  --   --   --   --   IBILI 3.2*  --  0.9  --   --   --   --    < > = values in this interval not displayed.    RADIOGRAPHIC STUDIES: I have personally reviewed the radiological images as listed and agreed with the findings in the report. No results found.    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 CT scan abdomen pelvis no evidence of any-malignancy noted.  CT chest unremarkable.   # Currently on prednisone  40 mg a day,  hemoglobin overall improved at 11 today. Continue mycophenolate mofetil (MMF) starting at a dose of 500 mg every 12 hours.  Consider increasing the dose to 1000 mg twice a day if hemolysis labs trending down. .Continue  folic acid .  # Recommend taking prednisone  20 mg a day for the next 2 weeks; and then take half a pill-do not stop until further instructions.  # Bilateral leg swelling/weight gain sec to streoids-[also awaiting Dr.dew appts] taper as above.   # Confusion question steroid psychosis-recommend tapering the dose of steroids and monitor closely.  # DISPOSITION: # No blood tomorrow; cancel appts.  # Follow up in 5 weeks- MD- labs-cbc/LDH;retic count- CMP;HOLD tube- D-2 Possible 1unit blood-- Dr.B    Above plan of care was discussed with patient/family in detail.  My contact information was given to the patient/family.       Cindy JONELLE Joe, MD 02/19/2024 10:50 AM

## 2024-02-20 ENCOUNTER — Inpatient Hospital Stay

## 2024-02-20 ENCOUNTER — Ambulatory Visit (INDEPENDENT_AMBULATORY_CARE_PROVIDER_SITE_OTHER): Admitting: Vascular Surgery

## 2024-02-20 ENCOUNTER — Encounter (INDEPENDENT_AMBULATORY_CARE_PROVIDER_SITE_OTHER): Payer: Self-pay | Admitting: Vascular Surgery

## 2024-02-20 VITALS — BP 158/76 | HR 91 | Resp 16 | Ht 69.0 in | Wt 192.0 lb

## 2024-02-20 DIAGNOSIS — I10 Essential (primary) hypertension: Secondary | ICD-10-CM | POA: Diagnosis not present

## 2024-02-20 DIAGNOSIS — E785 Hyperlipidemia, unspecified: Secondary | ICD-10-CM | POA: Diagnosis not present

## 2024-02-20 DIAGNOSIS — M7989 Other specified soft tissue disorders: Secondary | ICD-10-CM

## 2024-02-20 NOTE — Assessment & Plan Note (Signed)
 The patient has had prominent swelling in his legs that seem to be precipitated with the initiation of steroids.  Hopefully over the next several weeks, the steroids can be tapered and the swelling will likely continue to improve.  His leg elevation and increased activity have resulted in improved swelling already.  We discussed the use of compression socks but he seems a little hesitant to get those as he is already getting improvement.  We will get a venous reflux study in the near future at his convenience to evaluate for possible venous abnormalities contributing to the swelling.  I discussed that if he develops any significant weeping or skin breakdown, he needs to come in and get Unna boots placed.  Will see him back with his venous reflux study in the coming weeks.

## 2024-02-20 NOTE — Assessment & Plan Note (Signed)
 blood pressure control important in reducing the progression of atherosclerotic disease. On appropriate oral medications.

## 2024-02-20 NOTE — Progress Notes (Signed)
 Patient ID: Brian Montes, male   DOB: [birth year], 78 y.o.   MRN: [id number]  Chief Complaint  Patient presents with   New Patient (Initial Visit)    Ref Border consult bilateral le edema    HPI Brian Montes is a 78 y.o. male.  I am asked to see the patient by Fonda Mower for evaluation of lower extremity swelling.  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  Initially, he was having weeping of the tissue and skin and fluid running out of his legs.  Both legs were affected.  Swelling has gone down.  The right leg has healed.  The left leg still has some redness and irritation of the skin although there are no further open wounds.  These do not hurt him.  He has no fevers or chills.  He was treated with antibiotics.  He has been elevating his legs.  He has not used any compression socks at this point.  No previous history of DVT or superficial thrombophlebitis to his knowledge.     Past Medical History:  Diagnosis Date   Acid reflux    Calculus of gallbladder with acute and chronic cholecystitis without obstruction    Essential hypertension    Hyperlipidemia due to dietary fat intake     Past Surgical History:  Procedure Laterality Date   CARDIAC CATHETERIZATION  2010   Dr. Bosie - NORMAL CORONARIES   CHOLECYSTECTOMY N/A 11/02/2017   Procedure: LAPAROSCOPIC CHOLECYSTECTOMY;  Surgeon: [name], MD;  Location: [facility] ORS;  Service: General;  Laterality: N/A;   RIGHT COLECTOMY  2009   at [facility]     Family History  Problem Relation Age of Onset   CAD Father    Diabetes Brother    Bladder Cancer Other       Social History   Tobacco Use   Smoking status: Former   Smokeless tobacco: Current    Types: Chew  Vaping Use   Vaping status: Never Used  Substance Use Topics   Alcohol use: Not Currently   Drug use: Never     Allergies  Allergen Reactions   Codeine Other (See Comments)     Current Outpatient Medications  Medication Sig Dispense Refill   albuterol  (VENTOLIN  HFA) 108 (90 Base) MCG/ACT inhaler Inhale 2 puffs into the lungs once as needed for wheezing or shortness of breath (FOR GRADE 3, or 4 HYPERSENSITIVITY REACTIONS). 6.7 g 0   amitriptyline  (ELAVIL ) 10 MG tablet Take 10 mg by mouth at bedtime.     amLODipine  (NORVASC ) 10 MG tablet Take 1 tablet (10 mg total) by mouth daily. 90 tablet 1   aspirin  EC 81 MG tablet Take 81 mg by mouth daily.     atorvastatin  (LIPITOR) 40 MG tablet Take 40 mg by mouth at bedtime.     Calcium  Carb-Cholecalciferol 600-10 MG-MCG TABS Take 1 tablet by mouth 2 (two) times daily.     FLUoxetine  (PROZAC ) 20 MG capsule Take 20 mg by mouth daily.     folic acid  (FOLVITE ) 1 MG tablet Take 1 tablet (1 mg total) by mouth daily. 90 tablet 1   loratadine  (CLARITIN ) 10 MG tablet Take 10 mg by mouth daily.     mycophenolate (CELLCEPT) 500 MG tablet TAKE 1 TABLET BY MOUTH TWICE A DAY 180 tablet 1   omeprazole (PRILOSEC) 20 MG capsule Take 20 mg by mouth daily.  predniSONE  (DELTASONE ) 20 MG tablet Take 2 tablets (40 mg total) by mouth daily with breakfast.     thiamine (VITAMIN B-1) 100 MG tablet Take 100 mg by mouth daily.     traZODone  (DESYREL ) 100 MG tablet Take 100 mg by mouth at bedtime.     No current facility-administered medications for this visit.      REVIEW OF SYSTEMS (Negative unless checked)  Constitutional: [] Weight loss  [] Fever  [] Chills Cardiac: [] Chest pain   [] Chest pressure   [] Palpitations   [] Shortness of breath when laying flat   [] Shortness of breath at rest   [] Shortness of breath with exertion. Vascular:  [] Pain in legs with walking   [] Pain in legs at rest   [] Pain in legs when laying flat   [] Claudication   [] Pain in feet when walking  [] Pain in feet at rest  [] Pain in feet when laying flat   [] History of DVT   [] Phlebitis   [x] Swelling in legs   [] Varicose veins   [] Non-healing ulcers Pulmonary:   [] Uses home  oxygen   [] Productive cough   [] Hemoptysis   [] Wheeze  [] COPD   [] Asthma Neurologic:  [] Dizziness  [] Blackouts   [] Seizures   [] History of stroke   [] History of TIA  [] Aphasia   [] Temporary blindness   [] Dysphagia   [] Weakness or numbness in arms   [] Weakness or numbness in legs Musculoskeletal:  [] Arthritis   [] Joint swelling   [] Joint pain   [] Low back pain Hematologic:  [] Easy bruising  [] Easy bleeding   [] Hypercoagulable state   [x] Anemic  [] Hepatitis Gastrointestinal:  [] Blood in stool   [] Vomiting blood  [x] Gastroesophageal reflux/heartburn   [] Abdominal pain Genitourinary:  [] Chronic kidney disease   [] Difficult urination  [] Frequent urination  [] Burning with urination   [] Hematuria Skin:  [] Rashes   [] Ulcers   [] Wounds Psychological:  [] History of anxiety   []  History of major depression.    Physical Exam BP (!) 158/76   Pulse 91   Resp 16   Ht 5' 9 (1.753 m)   Wt 192 lb (87.1 kg)   BMI 28.35 kg/m  Gen:  WD/WN, NAD Head: [place]/AT, No temporalis wasting.  Ear/Nose/Throat: Hearing grossly intact, nares w/o erythema or drainage, oropharynx w/o Erythema/Exudate Eyes: Conjunctiva clear, sclera non-icteric  Neck: trachea midline.  No JVD.  Pulmonary:  Good air movement, respirations not labored, no use of accessory muscles  Cardiac: RRR, no JVD Vascular:  Vessel Right Left  Radial Palpable Palpable                                   Gastrointestinal:. No masses, surgical incisions, or scars. Musculoskeletal: M/S 5/5 throughout.  Extremities without ischemic changes.  No deformity or atrophy.  Moderate stasis dermatitis changes on the left leg with some skin irritation and redness but no open wounds.  Trace right lower extremity edema, 1+ left lower extremity edema. Neurologic: Sensation grossly intact in extremities.  Symmetrical.  Speech is fluent. Motor exam as listed above. Psychiatric: Judgment intact, Mood & affect appropriate for pt's clinical situation. Dermatologic:  No rashes or ulcers noted.  No cellulitis or open wounds.    Radiology No results found.  Labs Recent Results (from the past 2160 hours)  CBC with Differential     Status: Abnormal   Collection Time: 12/12/23  9:37 AM  Result Value Ref Range   WBC 3.6 (L) 4.0 - 10.5 K/uL  RBC 1.95 (L) 4.22 - 5.81 MIL/uL   Hemoglobin 7.2 (L) 13.0 - 17.0 g/dL   HCT 79.6 (L) 60.9 - 47.9 %   MCV 104.1 (H) 80.0 - 100.0 fL   MCH 36.9 (H) 26.0 - 34.0 pg   MCHC 35.5 30.0 - 36.0 g/dL   RDW 78.6 (H) 88.4 - 84.4 %   Platelets 112 (L) 150 - 400 K/uL   nRBC 0.6 (H) 0.0 - 0.2 %   Neutrophils Relative % 54 %   Neutro Abs 1.9 1.7 - 7.7 K/uL   Lymphocytes Relative 31 %   Lymphs Abs 1.1 0.7 - 4.0 K/uL   Monocytes Relative 8 %   Monocytes Absolute 0.3 0.1 - 1.0 K/uL   Eosinophils Relative 5 %   Eosinophils Absolute 0.2 0.0 - 0.5 K/uL   Basophils Relative 1 %   Basophils Absolute 0.0 0.0 - 0.1 K/uL   WBC Morphology MORPHOLOGY UNREMARKABLE    RBC Morphology See Note     Comment: MIXED RBC POPULATION   Smear Review Normal platelet morphology    Immature Granulocytes 1 %   Abs Immature Granulocytes 0.05 0.00 - 0.07 K/uL   Ovalocytes PRESENT     Comment: Performed at [facility], [address] [place], [place] [address]  Comprehensive metabolic panel     Status: Abnormal   Collection Time: 12/12/23  9:37 AM  Result Value Ref Range   Sodium 131 (L) 135 - 145 mmol/L   Potassium 3.8 3.5 - 5.1 mmol/L   Chloride 99 98 - 111 mmol/L   CO2 26 22 - 32 mmol/L   Glucose, Bld 105 (H) 70 - 99 mg/dL    Comment: Glucose reference range applies only to samples taken after fasting for at least 8 hours.   BUN 13 8 - 23 mg/dL   Creatinine, Ser 9.00 0.61 - 1.24 mg/dL   Calcium  9.2 8.9 - 10.3 mg/dL   Total Protein 6.7 6.5 - 8.1 g/dL   Albumin 3.2 (L) 3.5 - 5.0 g/dL   AST 85 (H) 15 - 41 U/L   ALT 70 (H) 0 - 44 U/L   Alkaline Phosphatase 118 38 - 126 U/L   Total Bilirubin 4.2 (H) 0.0 - 1.2 mg/dL   GFR,  Estimated >39 >39 mL/min    Comment: (NOTE) Calculated using the CKD-EPI Creatinine Equation (2021)    Anion gap 6 5 - 15    Comment: Performed at [facility], [address] [place], [place] [address]  Type and screen [facility]     Status: None   Collection Time: 12/12/23  9:37 AM  Result Value Ref Range   ABO/RH(D) A POS    Antibody Screen POS    Sample Expiration 12/15/2023,2359    Antibody Identification WARM AUTOANTIBODY    DAT, IgG POS    DAT, complement NEG    Unit Number [id number]    Blood Component Type RCLI PHER 1    Unit division 00    Status of Unit ISSUED,FINAL    Transfusion Status OK TO TRANSFUSE    Crossmatch Result COMPATIBLE    Unit Number [id number]    Blood Component Type RBC, LR IRR    Unit division 00    Status of Unit REL FROM [facility]    Transfusion Status OK TO TRANSFUSE    Crossmatch Result COMPATIBLE    Unit Number [id number]    Blood Component Type RBC, LR IRR    Unit division 00  Status of Unit REL FROM [facility]    Transfusion Status OK TO TRANSFUSE    Crossmatch Result COMPATIBLE   Direct antiglobulin test (not at [facility])     Status: None   Collection Time: 12/12/23  9:37 AM  Result Value Ref Range   DAT, complement NEG    DAT, IgG      POS Performed at [facility], [address] [place], [place] [address]   BPAM RBC     Status: None   Collection Time: 12/12/23  9:37 AM  Result Value Ref Range   ISSUE DATE / TIME 797491728892    Blood Product Unit Number [id number]    PRODUCT CODE [id number]    Unit Type and Rh 5100    Blood Product Expiration Date 797490817640    Blood Product Unit Number [id number]    Unit Type and Rh 6200    Blood Product Expiration Date 797490757640    Blood Product Unit Number [id number]    Unit Type and Rh 6200    Blood Product Expiration Date 797490757640   Vitamin B1     Status: Abnormal   Collection Time: 12/12/23 12:36 PM  Result Value Ref Range    Vitamin B1 (Thiamine) 57.4 (L) 66.5 - 200.0 nmol/L    Comment: (NOTE) **Verified by repeat analysis** This test was developed and its performance characteristics determined by [facility]. It has not been cleared or approved by the Food and Drug Administration. Performed At: [facility] [address] [place], [place] [id number] Jennette Shorter MD Ey:[id number]   RPR     Status: None   Collection Time: 12/12/23 12:36 PM  Result Value Ref Range   RPR Ser Ql NON REACTIVE NON REACTIVE    Comment: Performed at [facility], [address], [place], [place] [address]  TSH     Status: Abnormal   Collection Time: 12/12/23 12:36 PM  Result Value Ref Range   TSH 5.453 (H) 0.350 - 4.500 uIU/mL    Comment: Performed by a 3rd Generation assay with a functional sensitivity of <=0.01 uIU/mL. Performed at [facility], [address] [place], [place] [address]   Folate     Status: None   Collection Time: 12/12/23 12:36 PM  Result Value Ref Range   Folate 6.9 >5.9 ng/mL    Comment: Performed at [facility], [address] [place], [place] [address]  Iron and TIBC     Status: Abnormal   Collection Time: 12/12/23 12:36 PM  Result Value Ref Range   Iron 238 (H) 45 - 182 ug/dL   TIBC 617 749 - 549 ug/dL   Saturation Ratios 62 (H) 17.9 - 39.5 %   UIBC 144 ug/dL    Comment: Performed at [facility], [address] [place], [place] [address]  Ferritin     Status: None   Collection Time: 12/12/23 12:36 PM  Result Value Ref Range   Ferritin 221 24 - 336 ng/mL    Comment: Performed at [facility], [address] [place], [place] [address]  Lactate dehydrogenase     Status: Abnormal   Collection Time: 12/12/23 12:36 PM  Result Value Ref Range   LDH 387 (H) 98 - 192 U/L    Comment: Performed at [facility], [address], [place], [place] [address]  ABO/Rh     Status: None   Collection Time: 12/12/23 12:36 PM  Result Value Ref  Range   ABO/RH(D)  A POS Performed at [facility], [address] [place], [place] [address]   Vitamin B12     Status: None   Collection Time: 12/12/23 12:43 PM  Result Value Ref Range   Vitamin B-12 344 180 - 914 pg/mL    Comment: (NOTE) This assay is not validated for testing neonatal or myeloproliferative syndrome specimens for Vitamin B12 levels. Performed at [facility], [address], [place], [place] [address]   Rapid HIV screen (HIV 1/2 Ab+Ag)     Status: None   Collection Time: 12/12/23 12:43 PM  Result Value Ref Range   HIV-1 P24 Antigen - HIV24 NON REACTIVE NON REACTIVE    Comment: (NOTE) Detection of p24 may be inhibited by biotin in the sample, causing false negative results in acute infection.    HIV 1/2 Antibodies NON REACTIVE NON REACTIVE   Interpretation (HIV Ag Ab)      A non reactive test result means that HIV 1 or HIV 2 antibodies and HIV 1 p24 antigen were not detected in the specimen.    Comment: Performed at [facility], [address] [place], [place] [address]  Ammonia     Status: None   Collection Time: 12/12/23 12:43 PM  Result Value Ref Range   Ammonia 25 9 - 35 umol/L    Comment: Performed at [facility], [address] [place], [place] [address]  Reticulocytes     Status: Abnormal   Collection Time: 12/12/23 12:43 PM  Result Value Ref Range   Retic Ct Pct 7.6 (H) 0.4 - 3.1 %   RBC. 1.90 (L) 4.22 - 5.81 MIL/uL   Retic Count, Absolute 145.0 19.0 - 186.0 K/uL   Immature Retic Fract 15.7 2.3 - 15.9 %    Comment: Performed at [facility], [address] [place], [place] [address]  T4     Status: None   Collection Time: 12/12/23  4:56 PM  Result Value Ref Range   T4, Total 10.6 4.5 - 12.0 ug/dL    Comment: (NOTE) Performed At: [facility] [address] [place], [place] [id number] Jennette Shorter MD Ey:[id number]   Haptoglobin     Status: Abnormal   Collection Time: 12/12/23   4:56 PM  Result Value Ref Range   Haptoglobin <10 (L) 34 - 355 mg/dL    Comment: (NOTE) Performed At: [facility] [place] [address] [place], [place] [id number] Jennette Shorter MD Ey:[id number]   Bilirubin, fractionated(tot/dir/indir)     Status: Abnormal   Collection Time: 12/12/23  4:56 PM  Result Value Ref Range   Total Bilirubin 4.1 (H) 0.0 - 1.2 mg/dL   Bilirubin, Direct 0.9 (H) 0.0 - 0.2 mg/dL   Indirect Bilirubin 3.2 (H) 0.3 - 0.9 mg/dL    Comment: Performed at [facility], [address], [place], [place] [address]  Technologist smear review     Status: None   Collection Time: 12/12/23  4:56 PM  Result Value Ref Range   WBC MORPHOLOGY MORPHOLOGY UNREMARKABLE    RBC MORPHOLOGY MORPHOLOGY UNREMARKABLE    Plt Morphology Normal platelet morphology    Clinical Information hemolytic anemia     Comment: Performed at [facility], [address] [place], [place] [address]  Magnesium      Status: None   Collection Time: 12/13/23  6:35 AM  Result Value Ref Range   Magnesium  1.8 1.7 - 2.4 mg/dL    Comment: Performed at [facility], [address] [place], [place] [address]  CBC with Differential/Platelet     Status: Abnormal   Collection Time: 12/13/23  6:35 AM  Result Value Ref Range   WBC 2.3 (L) 4.0 - 10.5 K/uL   RBC 1.66 (L) 4.22 - 5.81 MIL/uL   Hemoglobin 6.1 (L) 13.0 - 17.0 g/dL   HCT 82.2 (L) 60.9 - 47.9 %   MCV 106.6 (H) 80.0 - 100.0 fL   MCH 36.7 (H) 26.0 - 34.0 pg   MCHC 34.5 30.0 - 36.0 g/dL   RDW 79.0 (H) 88.4 - 84.4 %   Platelets 117 (L) 150 - 400 K/uL   nRBC 0.0 0.0 - 0.2 %   Neutrophils Relative % 73 %   Neutro Abs 1.7 1.7 - 7.7 K/uL   Lymphocytes Relative 20 %   Lymphs Abs 0.5 (L) 0.7 - 4.0 K/uL   Monocytes Relative 4 %   Monocytes Absolute 0.1 0.1 - 1.0 K/uL   Eosinophils Relative 1 %   Eosinophils Absolute 0.0 0.0 - 0.5 K/uL   Basophils Relative 1 %   Basophils Absolute 0.0 0.0 - 0.1 K/uL   Immature Granulocytes 1  %   Abs Immature Granulocytes 0.03 0.00 - 0.07 K/uL    Comment: Performed at [facility], [address] [place], [place] [address]  Comprehensive metabolic panel with GFR     Status: Abnormal   Collection Time: 12/13/23  6:35 AM  Result Value Ref Range   Sodium 131 (L) 135 - 145 mmol/L   Potassium 4.2 3.5 - 5.1 mmol/L   Chloride 100 98 - 111 mmol/L   CO2 23 22 - 32 mmol/L   Glucose, Bld 146 (H) 70 - 99 mg/dL    Comment: Glucose reference range applies only to samples taken after fasting for at least 8 hours.   BUN 15 8 - 23 mg/dL   Creatinine, Ser 9.09 0.61 - 1.24 mg/dL   Calcium  8.9 8.9 - 10.3 mg/dL   Total Protein 6.1 (L) 6.5 - 8.1 g/dL   Albumin 2.8 (L) 3.5 - 5.0 g/dL   AST 69 (H) 15 - 41 U/L   ALT 60 (H) 0 - 44 U/L   Alkaline Phosphatase 113 38 - 126 U/L   Total Bilirubin 3.7 (H) 0.0 - 1.2 mg/dL   GFR, Estimated >39 >39 mL/min    Comment: (NOTE) Calculated using the CKD-EPI Creatinine Equation (2021)    Anion gap 8 5 - 15    Comment: Performed at [facility], [address] [place], [place] [address]  Phosphorus     Status: None   Collection Time: 12/13/23  6:35 AM  Result Value Ref Range   Phosphorus 2.6 2.5 - 4.6 mg/dL    Comment: Performed at [facility], [address] [place], [place] [address]  Prepare RBC (crossmatch)     Status: None   Collection Time: 12/13/23  8:30 AM  Result Value Ref Range   Order Confirmation      ORDER PROCESSED BY BLOOD BANK Performed at [facility], [address], [place], [place] [address]   Magnesium      Status: None   Collection Time: 12/14/23  4:16 AM  Result Value Ref Range   Magnesium  1.8 1.7 - 2.4 mg/dL    Comment: Performed at [facility], [address], [place], [place] [address]  CBC with Differential/Platelet     Status: Abnormal   Collection Time: 12/14/23  4:16 AM  Result Value Ref Range  WBC 4.9 4.0 - 10.5 K/uL   RBC 1.95 (L) 4.22 - 5.81 MIL/uL   Hemoglobin  7.1 (L) 13.0 - 17.0 g/dL   HCT 80.1 (L) 60.9 - 47.9 %   MCV 101.5 (H) 80.0 - 100.0 fL   MCH 36.4 (H) 26.0 - 34.0 pg   MCHC 35.9 30.0 - 36.0 g/dL   RDW 78.9 (H) 88.4 - 84.4 %   Platelets 123 (L) 150 - 400 K/uL   nRBC 0.0 0.0 - 0.2 %   Neutrophils Relative % 89 %   Neutro Abs 4.3 1.7 - 7.7 K/uL   Lymphocytes Relative 7 %   Lymphs Abs 0.3 (L) 0.7 - 4.0 K/uL   Monocytes Relative 3 %   Monocytes Absolute 0.2 0.1 - 1.0 K/uL   Eosinophils Relative 0 %   Eosinophils Absolute 0.0 0.0 - 0.5 K/uL   Basophils Relative 0 %   Basophils Absolute 0.0 0.0 - 0.1 K/uL   Immature Granulocytes 1 %   Abs Immature Granulocytes 0.05 0.00 - 0.07 K/uL    Comment: Performed at [facility], [address] [place], [place] [address]  Comprehensive metabolic panel with GFR     Status: Abnormal   Collection Time: 12/14/23  4:16 AM  Result Value Ref Range   Sodium 129 (L) 135 - 145 mmol/L   Potassium 3.9 3.5 - 5.1 mmol/L   Chloride 101 98 - 111 mmol/L   CO2 22 22 - 32 mmol/L   Glucose, Bld 164 (H) 70 - 99 mg/dL    Comment: Glucose reference range applies only to samples taken after fasting for at least 8 hours.   BUN 17 8 - 23 mg/dL   Creatinine, Ser 8.95 0.61 - 1.24 mg/dL   Calcium  8.9 8.9 - 10.3 mg/dL   Total Protein 6.2 (L) 6.5 - 8.1 g/dL   Albumin 3.0 (L) 3.5 - 5.0 g/dL   AST 48 (H) 15 - 41 U/L   ALT 46 (H) 0 - 44 U/L   Alkaline Phosphatase 92 38 - 126 U/L   Total Bilirubin 2.9 (H) 0.0 - 1.2 mg/dL   GFR, Estimated >39 >39 mL/min    Comment: (NOTE) Calculated using the CKD-EPI Creatinine Equation (2021)    Anion gap 6 5 - 15    Comment: Performed at [facility], [address] [place], [place] [address]  Hepatitis B surface antigen     Status: None   Collection Time: 12/14/23  4:16 AM  Result Value Ref Range   Hepatitis B Surface Ag NON REACTIVE NON REACTIVE    Comment: REPEATED TO VERIFY Performed at [facility], [address], [place], [place] [address]    Hepatitis B core antibody, total     Status: None   Collection Time: 12/14/23  4:16 AM  Result Value Ref Range   HEP B CORE AB Negative Negative    Comment: (NOTE) Performed At: [facility] [address] [place], [place] [id number] Jennette Shorter MD Ey:[id number]   Hepatitis C antibody     Status: None   Collection Time: 12/14/23  4:16 AM  Result Value Ref Range   HCV Ab NON REACTIVE NON REACTIVE    Comment: (NOTE) Nonreactive HCV antibody screen is consistent with no HCV infections,  unless recent infection is suspected or other evidence exists to indicate HCV infection.  Performed at [facility], [address] [place], [place] [address]   Lactate dehydrogenase     Status:  Abnormal   Collection Time: 12/14/23  4:16 AM  Result Value Ref Range   LDH 305 (H) 98 - 192 U/L    Comment: Performed at [facility], [address] [place], [place] [address]  Hemoglobin A1c     Status: Abnormal   Collection Time: 12/14/23  4:16 AM  Result Value Ref Range   Hgb A1c MFr Bld 4.6 (L) 4.8 - 5.6 %    Comment: (NOTE)         Prediabetes: 5.7 - 6.4         Diabetes: >6.4         Glycemic control for adults with diabetes: <7.0    Mean Plasma Glucose 85 mg/dL    Comment: (NOTE) Performed At: [facility] [place] [address] [place], [place] [id number] Jennette Shorter MD Ey:[id number]   CBC with Differential/Platelet     Status: Abnormal   Collection Time: 12/15/23  3:29 AM  Result Value Ref Range   WBC 4.9 4.0 - 10.5 K/uL   RBC 2.04 (L) 4.22 - 5.81 MIL/uL   Hemoglobin 7.2 (L) 13.0 - 17.0 g/dL   HCT 79.0 (L) 60.9 - 47.9 %   MCV 102.5 (H) 80.0 - 100.0 fL   MCH 35.3 (H) 26.0 - 34.0 pg   MCHC 34.4 30.0 - 36.0 g/dL   RDW 78.6 (H) 88.4 - 84.4 %   Platelets 129 (L) 150 - 400 K/uL   nRBC 0.0 0.0 - 0.2 %   Neutrophils Relative % 89 %   Neutro Abs 4.3 1.7 - 7.7 K/uL   Lymphocytes Relative 6 %   Lymphs Abs 0.3 (L) 0.7 - 4.0 K/uL   Monocytes Relative 4 %    Monocytes Absolute 0.2 0.1 - 1.0 K/uL   Eosinophils Relative 0 %   Eosinophils Absolute 0.0 0.0 - 0.5 K/uL   Basophils Relative 0 %   Basophils Absolute 0.0 0.0 - 0.1 K/uL   WBC Morphology MORPHOLOGY UNREMARKABLE    RBC Morphology See Note     Comment: MIXED RBC POPULATION   Smear Review Normal platelet morphology    Immature Granulocytes 1 %   Abs Immature Granulocytes 0.06 0.00 - 0.07 K/uL    Comment: Performed at [facility], [address] [place], [place] [address]  Comprehensive metabolic panel with GFR     Status: Abnormal   Collection Time: 12/15/23  3:29 AM  Result Value Ref Range   Sodium 133 (L) 135 - 145 mmol/L   Potassium 4.6 3.5 - 5.1 mmol/L   Chloride 103 98 - 111 mmol/L   CO2 25 22 - 32 mmol/L   Glucose, Bld 145 (H) 70 - 99 mg/dL    Comment: Glucose reference range applies only to samples taken after fasting for at least 8 hours.   BUN 21 8 - 23 mg/dL   Creatinine, Ser 9.13 0.61 - 1.24 mg/dL   Calcium  9.5 8.9 - 10.3 mg/dL   Total Protein 5.8 (L) 6.5 - 8.1 g/dL   Albumin 2.9 (L) 3.5 - 5.0 g/dL   AST 41 15 - 41 U/L   ALT 41 0 - 44 U/L   Alkaline Phosphatase 82 38 - 126 U/L   Total Bilirubin 2.7 (H) 0.0 - 1.2 mg/dL   GFR, Estimated >39 >39 mL/min    Comment: (NOTE) Calculated using the CKD-EPI Creatinine Equation (2021)    Anion gap 5 5 - 15    Comment: Performed at [facility], [address]  [place], [place] [address]  Lactate dehydrogenase     Status: Abnormal   Collection Time: 12/15/23  3:29 AM  Result Value Ref Range   LDH 271 (H) 98 - 192 U/L    Comment: Performed at [facility], [address] [place], [place] [address]  Sample to Blood Bank     Status: None   Collection Time: 12/22/23  8:51 AM  Result Value Ref Range   Blood Bank Specimen SAMPLE AVAILABLE FOR TESTING    Sample Expiration      12/25/2023,2359 Performed at [facility], [address] [place], [place] [address]   CMP (Cancer Center  only)     Status: Abnormal   Collection Time: 12/22/23  8:51 AM  Result Value Ref Range   Sodium 135 135 - 145 mmol/L   Potassium 3.4 (L) 3.5 - 5.1 mmol/L   Chloride 103 98 - 111 mmol/L   CO2 22 22 - 32 mmol/L   Glucose, Bld 115 (H) 70 - 99 mg/dL    Comment: Glucose reference range applies only to samples taken after fasting for at least 8 hours.   BUN 27 (H) 8 - 23 mg/dL   Creatinine 8.73 (H) 9.38 - 1.24 mg/dL   Calcium  9.5 8.9 - 10.3 mg/dL   Total Protein 6.3 (L) 6.5 - 8.1 g/dL   Albumin 3.6 3.5 - 5.0 g/dL   AST 37 15 - 41 U/L   ALT 52 (H) 0 - 44 U/L   Alkaline Phosphatase 95 38 - 126 U/L   Total Bilirubin 4.2 (HH) 0.0 - 1.2 mg/dL    Comment: RESULTS VERIFIED BY REPEAT TESTING RESULT CALLED TO, READ BACK BY AND VERIFIED WITH MICHELLE BRUNER AT 10:50 ON 12/22/2023 BY KMR    GFR, Estimated 59 (L) >60 mL/min    Comment: (NOTE) Calculated using the CKD-EPI Creatinine Equation (2021)    Anion gap 10 5 - 15    Comment: Performed at [facility], [address] [place], [place] [address]  CBC with Differential (Cancer Center Only)     Status: Abnormal   Collection Time: 12/22/23  8:51 AM  Result Value Ref Range   WBC Count 5.0 4.0 - 10.5 K/uL   RBC 2.58 (L) 4.22 - 5.81 MIL/uL   Hemoglobin 9.5 (L) 13.0 - 17.0 g/dL   HCT 72.7 (L) 60.9 - 47.9 %   MCV 105.4 (H) 80.0 - 100.0 fL   MCH 36.8 (H) 26.0 - 34.0 pg   MCHC 34.9 30.0 - 36.0 g/dL   RDW 81.7 (H) 88.4 - 84.4 %   Platelet Count 132 (L) 150 - 400 K/uL    Comment: PLATELET COUNT CONFIRMED BY SMEAR   nRBC 0.0 0.0 - 0.2 %   Neutrophils Relative % 83 %   Neutro Abs 4.1 1.7 - 7.7 K/uL   Lymphocytes Relative 9 %   Lymphs Abs 0.5 (L) 0.7 - 4.0 K/uL   Monocytes Relative 5 %   Monocytes Absolute 0.3 0.1 - 1.0 K/uL   Eosinophils Relative 1 %   Eosinophils Absolute 0.1 0.0 - 0.5 K/uL   Basophils Relative 0 %   Basophils Absolute 0.0 0.0 - 0.1 K/uL   Immature Granulocytes 2 %   Abs Immature Granulocytes 0.09 (H) 0.00 - 0.07 K/uL     Comment: Performed at [facility], [address] [place], [place] [address]  Comprehensive metabolic panel     Status: Abnormal   Collection Time: 12/23/23  4:20 AM  Result Value Ref Range   Sodium 133 (L) 135 - 145 mmol/L   Potassium 2.7 (LL) 3.5 - 5.1 mmol/L    Comment: CRITICAL RESULT CALLED TO, READ BACK BY AND VERIFIED WITH KELLY GIBSON AT 9546 12/23/23.PMF    Chloride 102 98 - 111 mmol/L   CO2 23 22 - 32 mmol/L   Glucose, Bld 93 70 - 99 mg/dL    Comment: Glucose reference range applies only to samples taken after fasting for at least 8 hours.   BUN 18 8 - 23 mg/dL   Creatinine, Ser 8.85 0.61 - 1.24 mg/dL   Calcium  8.9 8.9 - 10.3 mg/dL   Total Protein 6.0 (L) 6.5 - 8.1 g/dL   Albumin 3.4 (L) 3.5 - 5.0 g/dL   AST 35 15 - 41 U/L   ALT 44 0 - 44 U/L   Alkaline Phosphatase 84 38 - 126 U/L   Total Bilirubin 4.5 (H) 0.0 - 1.2 mg/dL   GFR, Estimated >39 >39 mL/min    Comment: (NOTE) Calculated using the CKD-EPI Creatinine Equation (2021)    Anion gap 8 5 - 15    Comment: Performed at [facility], [address] [place], [place] [address]  CBC     Status: Abnormal   Collection Time: 12/23/23  4:20 AM  Result Value Ref Range   WBC 3.7 (L) 4.0 - 10.5 K/uL   RBC 2.52 (L) 4.22 - 5.81 MIL/uL   Hemoglobin 9.2 (L) 13.0 - 17.0 g/dL   HCT 72.8 (L) 60.9 - 47.9 %   MCV 107.5 (H) 80.0 - 100.0 fL   MCH 36.5 (H) 26.0 - 34.0 pg   MCHC 33.9 30.0 - 36.0 g/dL   RDW 82.1 (H) 88.4 - 84.4 %   Platelets 112 (L) 150 - 400 K/uL   nRBC 0.0 0.0 - 0.2 %    Comment: Performed at [facility], [address] [place], [place] [address]  Resp panel by RT-PCR (RSV, Flu A&B, Covid) Anterior Nasal Swab     Status: None   Collection Time: 12/23/23  7:40 AM   Specimen: Anterior Nasal Swab  Result Value Ref Range   SARS Coronavirus 2 by RT PCR NEGATIVE NEGATIVE    Comment: (NOTE) SARS-CoV-2 target nucleic acids are NOT DETECTED.  The SARS-CoV-2 RNA is generally detectable  in upper respiratory specimens during the acute phase of infection. The lowest concentration of SARS-CoV-2 viral copies this assay can detect is 138 copies/mL. A negative result does not preclude SARS-Cov-2 infection and should not be used as the sole basis for treatment or other patient management decisions. A negative result may occur with  improper specimen collection/handling, submission of specimen other than nasopharyngeal swab, presence of viral mutation(s) within the areas targeted by this assay, and inadequate number of viral copies(<138 copies/mL). A negative result must be combined with clinical observations, patient history, and epidemiological information. The expected result is Negative.  Fact Sheet for Patients:  bloggercourse.com  Fact Sheet for Healthcare Providers:  seriousbroker.it  This test is no t yet approved or cleared by the [place]  FDA and  has been authorized for detection and/or diagnosis of SARS-CoV-2 by FDA under an Emergency Use Authorization (EUA). This EUA will remain  in effect (meaning this test can be used) for the duration of the COVID-19 declaration under Section 564(b)(1) of the Act, 21 U.S.C.section 360bbb-3(b)(1), unless the authorization is terminated  or revoked sooner.       Influenza A by PCR  NEGATIVE NEGATIVE   Influenza B by PCR NEGATIVE NEGATIVE    Comment: (NOTE) The Xpert Xpress SARS-CoV-2/FLU/RSV plus assay is intended as an aid in the diagnosis of influenza from Nasopharyngeal swab specimens and should not be used as a sole basis for treatment. Nasal washings and aspirates are unacceptable for Xpert Xpress SARS-CoV-2/FLU/RSV testing.  Fact Sheet for Patients: bloggercourse.com  Fact Sheet for Healthcare Providers: seriousbroker.it  This test is not yet approved or cleared by the [place]  FDA and has been  authorized for detection and/or diagnosis of SARS-CoV-2 by FDA under an Emergency Use Authorization (EUA). This EUA will remain in effect (meaning this test can be used) for the duration of the COVID-19 declaration under Section 564(b)(1) of the Act, 21 U.S.C. section 360bbb-3(b)(1), unless the authorization is terminated or revoked.     Resp Syncytial Virus by PCR NEGATIVE NEGATIVE    Comment: (NOTE) Fact Sheet for Patients: bloggercourse.com  Fact Sheet for Healthcare Providers: seriousbroker.it  This test is not yet approved or cleared by the [place]  FDA and has been authorized for detection and/or diagnosis of SARS-CoV-2 by FDA under an Emergency Use Authorization (EUA). This EUA will remain in effect (meaning this test can be used) for the duration of the COVID-19 declaration under Section 564(b)(1) of the Act, 21 U.S.C. section 360bbb-3(b)(1), unless the authorization is terminated or revoked.  Performed at [facility], [address] [place], [place] [address]   Troponin I (High Sensitivity)     Status: None   Collection Time: 12/23/23  7:40 AM  Result Value Ref Range   Troponin I (High Sensitivity) 17 <18 ng/L    Comment: (NOTE) Elevated high sensitivity troponin I (hsTnI) values and significant  changes across serial measurements may suggest ACS but many other  chronic and acute conditions are known to elevate hsTnI results.  Refer to the Links section for chest pain algorithms and additional  guidance. Performed at [facility], [address] [place], [place] [address]   Magnesium      Status: None   Collection Time: 12/23/23  7:40 AM  Result Value Ref Range   Magnesium  1.7 1.7 - 2.4 mg/dL    Comment: Performed at [facility], [address] [place], [place] [address]  Urinalysis, Routine w reflex microscopic -Urine, Clean Catch     Status: Abnormal   Collection Time:  12/23/23 10:21 AM  Result Value Ref Range   Color, Urine YELLOW (A) YELLOW   APPearance CLEAR (A) CLEAR   Specific Gravity, Urine 1.010 1.005 - 1.030   pH 6.0 5.0 - 8.0   Glucose, UA NEGATIVE NEGATIVE mg/dL   Hgb urine dipstick NEGATIVE NEGATIVE   Bilirubin Urine NEGATIVE NEGATIVE   Ketones, ur NEGATIVE NEGATIVE mg/dL   Protein, ur NEGATIVE NEGATIVE mg/dL   Nitrite NEGATIVE NEGATIVE   Leukocytes,Ua NEGATIVE NEGATIVE    Comment: Performed at [facility], [address] [place], [place] [address]  Ammonia     Status: None   Collection Time: 12/23/23  2:59 PM  Result Value Ref Range   Ammonia 23 9 - 35 umol/L    Comment: Performed at [facility], [address] [place], [place] [address]  Potassium     Status: None   Collection Time: 12/23/23  3:42 PM  Result Value Ref Range   Potassium 4.1 3.5 - 5.1 mmol/L    Comment: Performed at [facility], [address], [place], [place] [address]  Comprehensive metabolic  panel     Status: Abnormal   Collection Time: 12/24/23  4:51 AM  Result Value Ref Range   Sodium 136 135 - 145 mmol/L   Potassium 4.6 3.5 - 5.1 mmol/L   Chloride 108 98 - 111 mmol/L   CO2 25 22 - 32 mmol/L   Glucose, Bld 134 (H) 70 - 99 mg/dL    Comment: Glucose reference range applies only to samples taken after fasting for at least 8 hours.   BUN 19 8 - 23 mg/dL   Creatinine, Ser 8.79 0.61 - 1.24 mg/dL   Calcium  8.5 (L) 8.9 - 10.3 mg/dL   Total Protein 5.2 (L) 6.5 - 8.1 g/dL   Albumin 2.7 (L) 3.5 - 5.0 g/dL   AST 26 15 - 41 U/L   ALT 33 0 - 44 U/L   Alkaline Phosphatase 85 38 - 126 U/L   Total Bilirubin 3.2 (H) 0.0 - 1.2 mg/dL   GFR, Estimated >39 >39 mL/min    Comment: (NOTE) Calculated using the CKD-EPI Creatinine Equation (2021)    Anion gap 3 (L) 5 - 15    Comment: Performed at [facility], [address] [place], [place] [address]  CBC     Status: Abnormal   Collection Time: 12/24/23  4:51 AM  Result Value Ref  Range   WBC 2.1 (L) 4.0 - 10.5 K/uL   RBC 2.16 (L) 4.22 - 5.81 MIL/uL   Hemoglobin 7.9 (L) 13.0 - 17.0 g/dL   HCT 76.3 (L) 60.9 - 47.9 %   MCV 109.3 (H) 80.0 - 100.0 fL   MCH 36.6 (H) 26.0 - 34.0 pg   MCHC 33.5 30.0 - 36.0 g/dL   RDW 82.7 (H) 88.4 - 84.4 %   Platelets 84 (L) 150 - 400 K/uL    Comment: REPEATED TO VERIFY Immature Platelet Fraction may be clinically indicated, consider ordering this additional test [id number]    nRBC 0.0 0.0 - 0.2 %    Comment: Performed at [facility], [address] [place], [place] [address]  Lactate dehydrogenase     Status: None   Collection Time: 12/25/23  9:37 AM  Result Value Ref Range   LDH 178 98 - 192 U/L    Comment: Performed at [facility], [address] [place], [place] [address]  Comprehensive metabolic panel     Status: Abnormal   Collection Time: 12/25/23  9:37 AM  Result Value Ref Range   Sodium 135 135 - 145 mmol/L   Potassium 4.3 3.5 - 5.1 mmol/L   Chloride 104 98 - 111 mmol/L   CO2 26 22 - 32 mmol/L   Glucose, Bld 115 (H) 70 - 99 mg/dL    Comment: Glucose reference range applies only to samples taken after fasting for at least 8 hours.   BUN 26 (H) 8 - 23 mg/dL   Creatinine, Ser 9.01 0.61 - 1.24 mg/dL   Calcium  9.4 8.9 - 10.3 mg/dL   Total Protein 5.2 (L) 6.5 - 8.1 g/dL   Albumin 2.8 (L) 3.5 - 5.0 g/dL   AST 21 15 - 41 U/L   ALT 29 0 - 44 U/L   Alkaline Phosphatase 84 38 - 126 U/L   Total Bilirubin 1.8 (H) 0.0 - 1.2 mg/dL   GFR, Estimated >39 >39 mL/min    Comment: (NOTE) Calculated using the CKD-EPI Creatinine Equation (2021)    Anion gap 5 5 - 15    Comment: Performed at [facility],  [address] [place], [place] [address]  CBC     Status: Abnormal   Collection Time: 12/25/23  9:37 AM  Result Value Ref Range   WBC 5.3 4.0 - 10.5 K/uL   RBC 2.12 (L) 4.22 - 5.81 MIL/uL   Hemoglobin 7.8 (L) 13.0 - 17.0 g/dL   HCT 77.7 (L) 60.9 - 47.9 %   MCV 104.7 (H) 80.0 - 100.0 fL   MCH  36.8 (H) 26.0 - 34.0 pg   MCHC 35.1 30.0 - 36.0 g/dL   RDW 83.2 (H) 88.4 - 84.4 %   Platelets 97 (L) 150 - 400 K/uL    Comment: Immature Platelet Fraction may be clinically indicated, consider ordering this additional test [id number]    nRBC 0.0 0.0 - 0.2 %    Comment: Performed at [facility], [address] [place], [place] [address]  CBC with Differential/Platelet     Status: Abnormal   Collection Time: 12/26/23  8:00 AM  Result Value Ref Range   WBC 4.5 4.0 - 10.5 K/uL   RBC 2.05 (L) 4.22 - 5.81 MIL/uL   Hemoglobin 7.5 (L) 13.0 - 17.0 g/dL   HCT 78.1 (L) 60.9 - 47.9 %   MCV 106.3 (H) 80.0 - 100.0 fL   MCH 36.6 (H) 26.0 - 34.0 pg   MCHC 34.4 30.0 - 36.0 g/dL   RDW 83.0 (H) 88.4 - 84.4 %   Platelets 102 (L) 150 - 400 K/uL   nRBC 0.0 0.0 - 0.2 %   Neutrophils Relative % 88 %   Neutro Abs 3.9 1.7 - 7.7 K/uL   Lymphocytes Relative 7 %   Lymphs Abs 0.3 (L) 0.7 - 4.0 K/uL   Monocytes Relative 4 %   Monocytes Absolute 0.2 0.1 - 1.0 K/uL   Eosinophils Relative 0 %   Eosinophils Absolute 0.0 0.0 - 0.5 K/uL   Basophils Relative 0 %   Basophils Absolute 0.0 0.0 - 0.1 K/uL   Immature Granulocytes 1 %   Abs Immature Granulocytes 0.06 0.00 - 0.07 K/uL    Comment: Performed at [facility], [address] [place], [place] [address]  Lactate dehydrogenase     Status: Abnormal   Collection Time: 12/26/23  8:00 AM  Result Value Ref Range   LDH 221 (H) 98 - 192 U/L    Comment: Performed at [facility], [address] [place], [place] [address]  Reticulocytes     Status: Abnormal   Collection Time: 12/26/23  8:00 AM  Result Value Ref Range   Retic Ct Pct 4.0 (H) 0.4 - 3.1 %   RBC. 2.03 (L) 4.22 - 5.81 MIL/uL   Retic Count, Absolute 81.6 19.0 - 186.0 K/uL   Immature Retic Fract 5.8 2.3 - 15.9 %    Comment: Performed at [facility], [address] [place], [place] [address]  Bilirubin, fractionated(tot/dir/indir)     Status: Abnormal    Collection Time: 12/26/23  8:00 AM  Result Value Ref Range   Total Bilirubin 1.4 (H) 0.0 - 1.2 mg/dL   Bilirubin, Direct 0.5 (H) 0.0 - 0.2 mg/dL   Indirect Bilirubin 0.9 0.3 - 0.9 mg/dL    Comment: Performed at [facility], [address] [place], [place] [address]  Prepare RBC (crossmatch)     Status: None   Collection Time: 12/26/23  9:27 AM  Result Value Ref Range   Order Confirmation      ORDER PROCESSED BY BLOOD BANK Performed at  [facility], [address], [place], [place] [address]   Type and screen [facility]     Status: None   Collection Time: 12/26/23 12:10 PM  Result Value Ref Range   ABO/RH(D) A POS    Antibody Screen NEG    Sample Expiration 12/29/2023,2359    Unit Number [id number]    Blood Component Type RBC, LR IRR    Unit division 00    Status of Unit ISSUED,FINAL    Transfusion Status OK TO TRANSFUSE    Crossmatch Result COMPATIBLE    Unit Number [id number]    Blood Component Type RBC, LR IRR    Unit division 00    Status of Unit REL FROM [facility]    Transfusion Status OK TO TRANSFUSE    Crossmatch Result COMPATIBLE    Unit Number [id number]    Blood Component Type RBC, LR IRR    Unit division 00    Status of Unit REL FROM [facility]    Transfusion Status OK TO TRANSFUSE    Crossmatch Result COMPATIBLE   BPAM RBC     Status: None   Collection Time: 12/26/23 12:10 PM  Result Value Ref Range   ISSUE DATE / TIME [id number]    Blood Product Unit Number [id number]    PRODUCT CODE E0379V00    Unit Type and Rh 6200    Blood Product Expiration Date 797490737640    Blood Product Unit Number [id number]    PRODUCT CODE E0379V00    Unit Type and Rh 6200    Blood Product Expiration Date 797489977640    Blood Product Unit Number [id number]    PRODUCT CODE [id number]    Unit Type and Rh 5100    Blood Product Expiration Date 797489977640   Hemoglobin and hematocrit, blood     Status: Abnormal   Collection  Time: 12/26/23  6:54 PM  Result Value Ref Range   Hemoglobin 9.3 (L) 13.0 - 17.0 g/dL   HCT 72.9 (L) 60.9 - 47.9 %    Comment: Performed at [facility], [address], [place], [place] [address]  Sample to Blood Bank     Status: None   Collection Time: 12/29/23  1:09 PM  Result Value Ref Range   Blood Bank Specimen SAMPLE AVAILABLE FOR TESTING    Sample Expiration      01/01/2024,2359 Performed at [facility], [address] [place], [place] [address]   CMP (Cancer Center only)     Status: Abnormal   Collection Time: 12/29/23  1:09 PM  Result Value Ref Range   Sodium 134 (L) 135 - 145 mmol/L   Potassium 3.9 3.5 - 5.1 mmol/L   Chloride 105 98 - 111 mmol/L   CO2 23 22 - 32 mmol/L   Glucose, Bld 127 (H) 70 - 99 mg/dL    Comment: Glucose reference range applies only to samples taken after fasting for at least 8 hours.   BUN 30 (H) 8 - 23 mg/dL   Creatinine 8.95 9.38 - 1.24 mg/dL   Calcium  9.5 8.9 - 10.3 mg/dL   Total Protein 6.1 (L) 6.5 - 8.1 g/dL   Albumin 3.6 3.5 - 5.0 g/dL   AST 34 15 - 41 U/L   ALT 41 0 - 44 U/L   Alkaline Phosphatase 161 (H) 38 - 126 U/L   Total Bilirubin 3.6 (HH) 0.0 - 1.2 mg/dL    Comment: CRITICAL RESULT CALLED TO, READ BACK BY AND VERIFIED WITH BRUNER,M  AT 1300 ON 9.12.25 BY ISLEY,B REPEATED TO VERIFY    GFR, Estimated >60 >60 mL/min    Comment: (NOTE) Calculated using the CKD-EPI Creatinine Equation (2021)    Anion gap 6 5 - 15    Comment: Performed at [facility], [address] [place], [place] [address]  Lactate dehydrogenase     Status: Abnormal   Collection Time: 12/29/23  1:09 PM  Result Value Ref Range   LDH 207 (H) 98 - 192 U/L    Comment: Performed at [facility], [address] [place], [place] [address]  CBC with Differential (Cancer Center Only)     Status: Abnormal   Collection Time: 12/29/23  1:09 PM  Result Value Ref Range   WBC Count 6.1 4.0 - 10.5 K/uL   RBC 3.10 (L) 4.22 - 5.81 MIL/uL    Hemoglobin 10.7 (L) 13.0 - 17.0 g/dL   HCT 69.2 (L) 60.9 - 47.9 %   MCV 99.0 80.0 - 100.0 fL   MCH 34.5 (H) 26.0 - 34.0 pg   MCHC 34.9 30.0 - 36.0 g/dL   RDW 81.5 (H) 88.4 - 84.4 %   Platelet Count 101 (L) 150 - 400 K/uL   nRBC 0.0 0.0 - 0.2 %   Neutrophils Relative % 92 %   Neutro Abs 5.6 1.7 - 7.7 K/uL   Lymphocytes Relative 4 %   Lymphs Abs 0.3 (L) 0.7 - 4.0 K/uL   Monocytes Relative 2 %   Monocytes Absolute 0.1 0.1 - 1.0 K/uL   Eosinophils Relative 0 %   Eosinophils Absolute 0.0 0.0 - 0.5 K/uL   Basophils Relative 0 %   Basophils Absolute 0.0 0.0 - 0.1 K/uL   Immature Granulocytes 2 %   Abs Immature Granulocytes 0.12 (H) 0.00 - 0.07 K/uL    Comment: Performed at [facility], [address] [place], [place] [address]  Lactate dehydrogenase     Status: Abnormal   Collection Time: 01/04/24  8:16 AM  Result Value Ref Range   LDH 204 (H) 98 - 192 U/L    Comment: Performed at [facility], [address] [place], [place] [address]  CMP (Cancer Center only)     Status: Abnormal   Collection Time: 01/04/24  8:16 AM  Result Value Ref Range   Sodium 135 135 - 145 mmol/L   Potassium 3.1 (L) 3.5 - 5.1 mmol/L   Chloride 105 98 - 111 mmol/L   CO2 24 22 - 32 mmol/L   Glucose, Bld 111 (H) 70 - 99 mg/dL    Comment: Glucose reference range applies only to samples taken after fasting for at least 8 hours.   BUN 24 (H) 8 - 23 mg/dL   Creatinine 8.98 9.38 - 1.24 mg/dL   Calcium  9.2 8.9 - 10.3 mg/dL   Total Protein 5.5 (L) 6.5 - 8.1 g/dL   Albumin 3.3 (L) 3.5 - 5.0 g/dL   AST 31 15 - 41 U/L   ALT 47 (H) 0 - 44 U/L   Alkaline Phosphatase 131 (H) 38 - 126 U/L   Total Bilirubin 3.7 (HH) 0.0 - 1.2 mg/dL    Comment: CRITICAL RESULT CALLED TO, READ BACK BY AND VERIFIED WITH [facility] @ 0913 01/04/2024 JP REPEATED TO VERIFY    GFR, Estimated >60 >60 mL/min    Comment: (NOTE) Calculated using the CKD-EPI Creatinine Equation (2021)    Anion gap 6 5 - 15    Comment:  Performed at [facility], [address] [place], [place] [address]  CBC with Differential/Platelet     Status: Abnormal   Collection Time: 01/04/24  8:16 AM  Result Value Ref Range   WBC 4.2 4.0 - 10.5 K/uL   RBC 2.91 (L) 4.22 - 5.81 MIL/uL   Hemoglobin 10.2 (L) 13.0 - 17.0 g/dL   HCT 71.4 (L) 60.9 - 47.9 %   MCV 97.9 80.0 - 100.0 fL   MCH 35.1 (H) 26.0 - 34.0 pg   MCHC 35.8 30.0 - 36.0 g/dL   RDW 83.0 (H) 88.4 - 84.4 %   Platelets 57 (L) 150 - 400 K/uL   nRBC 0.0 0.0 - 0.2 %   Neutrophils Relative % 69 %   Neutro Abs 2.9 1.7 - 7.7 K/uL   Lymphocytes Relative 23 %   Lymphs Abs 1.0 0.7 - 4.0 K/uL   Monocytes Relative 5 %   Monocytes Absolute 0.2 0.1 - 1.0 K/uL   Eosinophils Relative 1 %   Eosinophils Absolute 0.0 0.0 - 0.5 K/uL   Basophils Relative 0 %   Basophils Absolute 0.0 0.0 - 0.1 K/uL   Immature Granulocytes 2 %   Abs Immature Granulocytes 0.07 0.00 - 0.07 K/uL    Comment: Performed at [facility], [address] [place], [place] [address]  Lactate dehydrogenase     Status: Abnormal   Collection Time: 01/12/24  9:09 AM  Result Value Ref Range   LDH 264 (H) 98 - 192 U/L    Comment: Performed at [facility], [address] [place], [place] [address]  CBC with Differential (Cancer Center Only)     Status: Abnormal   Collection Time: 01/12/24  9:09 AM  Result Value Ref Range   WBC Count 6.6 4.0 - 10.5 K/uL   RBC 2.65 (L) 4.22 - 5.81 MIL/uL   Hemoglobin 9.2 (L) 13.0 - 17.0 g/dL   HCT 74.3 (L) 60.9 - 47.9 %   MCV 96.6 80.0 - 100.0 fL   MCH 34.7 (H) 26.0 - 34.0 pg   MCHC 35.9 30.0 - 36.0 g/dL   RDW 82.5 (H) 88.4 - 84.4 %   Platelet Count 69 (L) 150 - 400 K/uL   nRBC 0.0 0.0 - 0.2 %   Neutrophils Relative % 75 %   Neutro Abs 5.0 1.7 - 7.7 K/uL   Lymphocytes Relative 19 %   Lymphs Abs 1.3 0.7 - 4.0 K/uL   Monocytes Relative 4 %   Monocytes Absolute 0.2 0.1 - 1.0 K/uL   Eosinophils Relative 0 %   Eosinophils Absolute 0.0 0.0 - 0.5 K/uL    Basophils Relative 0 %   Basophils Absolute 0.0 0.0 - 0.1 K/uL   Immature Granulocytes 2 %   Abs Immature Granulocytes 0.12 (H) 0.00 - 0.07 K/uL    Comment: Performed at [facility], [address] [place], [place] [address]  CMP (Cancer Center only)     Status: Abnormal   Collection Time: 01/12/24  9:10 AM  Result Value Ref Range   Sodium 138 135 - 145 mmol/L   Potassium 3.2 (L) 3.5 - 5.1 mmol/L   Chloride 107 98 - 111 mmol/L   CO2 24 22 - 32 mmol/L   Glucose, Bld 94 70 - 99 mg/dL    Comment: Glucose reference range applies only to samples taken after fasting for at least 8 hours.   BUN 38 (H) 8 - 23 mg/dL   Creatinine 8.93 9.38 - 1.24 mg/dL  Calcium  9.4 8.9 - 10.3 mg/dL   Total Protein 5.4 (L) 6.5 - 8.1 g/dL   Albumin 3.2 (L) 3.5 - 5.0 g/dL   AST 36 15 - 41 U/L   ALT 48 (H) 0 - 44 U/L   Alkaline Phosphatase 94 38 - 126 U/L   Total Bilirubin 3.3 (H) 0.0 - 1.2 mg/dL   GFR, Estimated >39 >39 mL/min    Comment: (NOTE) Calculated using the CKD-EPI Creatinine Equation (2021)    Anion gap 7 5 - 15    Comment: Performed at [facility], [address] [place], [place] [address]  CMP (Cancer Center only)     Status: Abnormal   Collection Time: 01/18/24  8:26 AM  Result Value Ref Range   Sodium 139 135 - 145 mmol/L   Potassium 3.4 (L) 3.5 - 5.1 mmol/L   Chloride 108 98 - 111 mmol/L   CO2 22 22 - 32 mmol/L   Glucose, Bld 110 (H) 70 - 99 mg/dL    Comment: Glucose reference range applies only to samples taken after fasting for at least 8 hours.   BUN 27 (H) 8 - 23 mg/dL   Creatinine 8.77 9.38 - 1.24 mg/dL   Calcium  8.9 8.9 - 10.3 mg/dL   Total Protein 4.8 (L) 6.5 - 8.1 g/dL   Albumin 2.7 (L) 3.5 - 5.0 g/dL   AST 39 15 - 41 U/L   ALT 53 (H) 0 - 44 U/L   Alkaline Phosphatase 80 38 - 126 U/L   Total Bilirubin 3.3 (H) 0.0 - 1.2 mg/dL   GFR, Estimated >39 >39 mL/min    Comment: (NOTE) Calculated using the CKD-EPI Creatinine Equation (2021)    Anion gap 9 5 - 15     Comment: Performed at [facility], [address] [place], [place] [address]  Lactate dehydrogenase     Status: Abnormal   Collection Time: 01/18/24  8:26 AM  Result Value Ref Range   LDH 259 (H) 98 - 192 U/L    Comment: Performed at [facility], [address] [place], [place] [address]  CBC with Differential (Cancer Center Only)     Status: Abnormal   Collection Time: 01/18/24  8:26 AM  Result Value Ref Range   WBC Count 4.3 4.0 - 10.5 K/uL   RBC 2.46 (L) 4.22 - 5.81 MIL/uL   Hemoglobin 8.7 (L) 13.0 - 17.0 g/dL   HCT 75.5 (L) 60.9 - 47.9 %   MCV 99.2 80.0 - 100.0 fL   MCH 35.4 (H) 26.0 - 34.0 pg   MCHC 35.7 30.0 - 36.0 g/dL   RDW 81.9 (H) 88.4 - 84.4 %   Platelet Count 63 (L) 150 - 400 K/uL   nRBC 0.0 0.0 - 0.2 %   Neutrophils Relative % 65 %   Neutro Abs 2.8 1.7 - 7.7 K/uL   Lymphocytes Relative 27 %   Lymphs Abs 1.2 0.7 - 4.0 K/uL   Monocytes Relative 5 %   Monocytes Absolute 0.2 0.1 - 1.0 K/uL   Eosinophils Relative 0 %   Eosinophils Absolute 0.0 0.0 - 0.5 K/uL   Basophils Relative 0 %   Basophils Absolute 0.0 0.0 - 0.1 K/uL   Immature Granulocytes 3 %   Abs Immature Granulocytes 0.13 (H) 0.00 - 0.07 K/uL    Comment: Performed at [facility], [address] [place], [place] [address]  Sample to Blood Bank     Status: None   Collection  Time: 02/01/24  3:09 PM  Result Value Ref Range   Blood Bank Specimen SAMPLE AVAILABLE FOR TESTING    Sample Expiration      02/04/2024,2359 Performed at [facility], [address] [place], [place] [address]   Lactate dehydrogenase     Status: Abnormal   Collection Time: 02/01/24  3:09 PM  Result Value Ref Range   LDH 340 (H) 98 - 192 U/L    Comment: Performed at [facility], [address] [place], [place] [address]  CMP (Cancer Center only)     Status: Abnormal   Collection Time: 02/01/24  3:09 PM  Result Value Ref Range   Sodium 137 135 - 145 mmol/L   Potassium 3.5 3.5 - 5.1  mmol/L   Chloride 103 98 - 111 mmol/L   CO2 24 22 - 32 mmol/L   Glucose, Bld 109 (H) 70 - 99 mg/dL    Comment: Glucose reference range applies only to samples taken after fasting for at least 8 hours.   BUN 29 (H) 8 - 23 mg/dL   Creatinine 8.86 9.38 - 1.24 mg/dL   Calcium  9.1 8.9 - 10.3 mg/dL   Total Protein 5.2 (L) 6.5 - 8.1 g/dL   Albumin 3.2 (L) 3.5 - 5.0 g/dL   AST 41 15 - 41 U/L   ALT 40 0 - 44 U/L   Alkaline Phosphatase 70 38 - 126 U/L   Total Bilirubin 3.1 (H) 0.0 - 1.2 mg/dL   GFR, Estimated >39 >39 mL/min    Comment: (NOTE) Calculated using the CKD-EPI Creatinine Equation (2021)    Anion gap 10 5 - 15    Comment: Performed at [facility], [address] [place], [place] [address]  CBC with Differential (Cancer Center Only)     Status: Abnormal   Collection Time: 02/01/24  3:09 PM  Result Value Ref Range   WBC Count 4.0 4.0 - 10.5 K/uL   RBC 2.54 (L) 4.22 - 5.81 MIL/uL   Hemoglobin 8.8 (L) 13.0 - 17.0 g/dL   HCT 74.3 (L) 60.9 - 47.9 %   MCV 100.8 (H) 80.0 - 100.0 fL   MCH 34.6 (H) 26.0 - 34.0 pg   MCHC 34.4 30.0 - 36.0 g/dL   RDW 81.3 (H) 88.4 - 84.4 %   Platelet Count 63 (L) 150 - 400 K/uL   nRBC 0.0 0.0 - 0.2 %   Neutrophils Relative % 45 %   Neutro Abs 1.8 1.7 - 7.7 K/uL   Lymphocytes Relative 47 %   Lymphs Abs 1.9 0.7 - 4.0 K/uL   Monocytes Relative 4 %   Monocytes Absolute 0.2 0.1 - 1.0 K/uL   Eosinophils Relative 0 %   Eosinophils Absolute 0.0 0.0 - 0.5 K/uL   Basophils Relative 0 %   Basophils Absolute 0.0 0.0 - 0.1 K/uL   Immature Granulocytes 4 %   Abs Immature Granulocytes 0.15 (H) 0.00 - 0.07 K/uL    Comment: Performed at [facility], [address] [place], [place] [address]  Sample to Blood Bank     Status: None   Collection Time: 02/19/24  9:10 AM  Result Value Ref Range   Blood Bank Specimen SAMPLE AVAILABLE FOR TESTING    Sample Expiration      02/22/2024,2359 Performed at [facility], [address],  [place], [place] [address]   Lactate dehydrogenase     Status: Abnormal   Collection Time: 02/19/24  9:10 AM  Result  Value Ref Range   LDH 299 (H) 98 - 192 U/L    Comment: Performed at [facility], [address] [place], [place] [address]  CMP (Cancer Center only)     Status: Abnormal   Collection Time: 02/19/24  9:10 AM  Result Value Ref Range   Sodium 137 135 - 145 mmol/L   Potassium 3.2 (L) 3.5 - 5.1 mmol/L   Chloride 101 98 - 111 mmol/L   CO2 24 22 - 32 mmol/L   Glucose, Bld 106 (H) 70 - 99 mg/dL    Comment: Glucose reference range applies only to samples taken after fasting for at least 8 hours.   BUN 15 8 - 23 mg/dL   Creatinine 8.72 (H) 9.38 - 1.24 mg/dL   Calcium  8.5 (L) 8.9 - 10.3 mg/dL   Total Protein 5.4 (L) 6.5 - 8.1 g/dL   Albumin 3.3 (L) 3.5 - 5.0 g/dL   AST 30 15 - 41 U/L   ALT 28 0 - 44 U/L   Alkaline Phosphatase 100 38 - 126 U/L   Total Bilirubin 2.5 (H) 0.0 - 1.2 mg/dL   GFR, Estimated 59 (L) >60 mL/min    Comment: (NOTE) Calculated using the CKD-EPI Creatinine Equation (2021)    Anion gap 12 5 - 15    Comment: Performed at [facility], [address] [place], [place] [address]  CBC with Differential (Cancer Center Only)     Status: Abnormal   Collection Time: 02/19/24  9:10 AM  Result Value Ref Range   WBC Count 2.5 (L) 4.0 - 10.5 K/uL   RBC 3.28 (L) 4.22 - 5.81 MIL/uL   Hemoglobin 11.2 (L) 13.0 - 17.0 g/dL   HCT 66.9 (L) 60.9 - 47.9 %   MCV 100.6 (H) 80.0 - 100.0 fL   MCH 34.1 (H) 26.0 - 34.0 pg   MCHC 33.9 30.0 - 36.0 g/dL   RDW 85.7 88.4 - 84.4 %   Platelet Count 70 (L) 150 - 400 K/uL   nRBC 0.0 0.0 - 0.2 %   Neutrophils Relative % 54 %   Neutro Abs 1.4 (L) 1.7 - 7.7 K/uL   Lymphocytes Relative 37 %   Lymphs Abs 0.9 0.7 - 4.0 K/uL   Monocytes Relative 4 %   Monocytes Absolute 0.1 0.1 - 1.0 K/uL   Eosinophils Relative 0 %   Eosinophils Absolute 0.0 0.0 - 0.5 K/uL   Basophils Relative 0 %   Basophils Absolute 0.0 0.0 - 0.1 K/uL    Immature Granulocytes 5 %   Abs Immature Granulocytes 0.12 (H) 0.00 - 0.07 K/uL    Comment: Performed at [facility], [address] [place], [place] [address]    Assessment/Plan:  Swelling of limb The patient has had prominent swelling in his legs that seem to be precipitated with the initiation of steroids.  Hopefully over the next several weeks, the steroids can be tapered and the swelling will likely continue to improve.  His leg elevation and increased activity have resulted in improved swelling already.  We discussed the use of compression socks but he seems a little hesitant to get those as he is already getting improvement.  We will get a venous reflux study in the near future at his convenience to evaluate for possible venous abnormalities contributing to the swelling.  I discussed that if he develops any significant weeping or skin breakdown, he needs to come in and get Unna boots placed.  Will see him  back with his venous reflux study in the coming weeks.  Hyperlipidemia lipid control important in reducing the progression of atherosclerotic disease. Continue statin therapy   Essential hypertension blood pressure control important in reducing the progression of atherosclerotic disease. On appropriate oral medications.      Selinda Gu 02/20/2024, 3:12 PM   This note was created with Dragon medical transcription system.  Any errors from dictation are unintentional.

## 2024-02-20 NOTE — Assessment & Plan Note (Signed)
 lipid control important in reducing the progression of atherosclerotic disease. Continue statin therapy

## 2024-03-08 ENCOUNTER — Ambulatory Visit (INDEPENDENT_AMBULATORY_CARE_PROVIDER_SITE_OTHER)

## 2024-03-08 ENCOUNTER — Encounter (INDEPENDENT_AMBULATORY_CARE_PROVIDER_SITE_OTHER): Payer: Self-pay | Admitting: Vascular Surgery

## 2024-03-08 ENCOUNTER — Ambulatory Visit (INDEPENDENT_AMBULATORY_CARE_PROVIDER_SITE_OTHER): Admitting: Vascular Surgery

## 2024-03-08 VITALS — BP 149/82 | HR 97 | Resp 17 | Ht 69.0 in | Wt 182.0 lb

## 2024-03-08 DIAGNOSIS — I10 Essential (primary) hypertension: Secondary | ICD-10-CM

## 2024-03-08 DIAGNOSIS — M7989 Other specified soft tissue disorders: Secondary | ICD-10-CM

## 2024-03-08 DIAGNOSIS — E785 Hyperlipidemia, unspecified: Secondary | ICD-10-CM

## 2024-03-11 ENCOUNTER — Encounter (INDEPENDENT_AMBULATORY_CARE_PROVIDER_SITE_OTHER): Payer: Self-pay | Admitting: Vascular Surgery

## 2024-03-11 NOTE — Progress Notes (Signed)
 Subjective:    Patient ID: Brian Montes, male    DOB: Aug 23, 1945, 78 y.o.   MRN: [id number] Chief Complaint  Patient presents with   Follow-up    pt conv B Reflux    Brian Montes is a 78 y.o. male who returns to clinic today for evaluation of lower extremity swelling.  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  Initially, he was having weeping of the tissue and skin and fluid running out of his legs.  Both legs were affected.  Swelling has gone down.  The right leg has healed.  The left leg still has some redness and irritation of the skin although there are no further open wounds.  These do not hurt him.  He has no fevers or chills.  He was treated with antibiotics.  He has been elevating his legs.  He has not used any compression socks at this point.  No previous history of DVT or superficial thrombophlebitis to his knowledge.      Review of Systems  Constitutional: Negative.   Cardiovascular:  Positive for leg swelling.  Musculoskeletal:  Positive for myalgias.  Skin:  Positive for color change.  All other systems reviewed and are negative.      Objective:   Physical Exam Vitals reviewed.  Constitutional:      Appearance: Normal appearance. He is normal weight.  HENT:     Head: Normocephalic and atraumatic.  Eyes:     Pupils: Pupils are equal, round, and reactive to light.  Cardiovascular:     Rate and Rhythm: Normal rate and regular rhythm.     Pulses: Normal pulses.     Heart sounds: Normal heart sounds.  Pulmonary:     Effort: Pulmonary effort is normal.     Breath sounds: Normal breath sounds.  Abdominal:     General: Abdomen is flat. Bowel sounds are normal.     Palpations: Abdomen is soft.  Musculoskeletal:        General: Normal range of motion.     Comments: No swelling on exam today  Skin:    General: Skin is warm and dry.     Capillary Refill: Capillary refill takes 2 to  3 seconds.     Comments: Bilateral lower extremities are very very dry.  Instructed to use some type of ointment or cream to help with the dryness  Neurological:     General: No focal deficit present.     Mental Status: He is alert and oriented to person, place, and time. Mental status is at baseline.  Psychiatric:        Mood and Affect: Mood normal.        Behavior: Behavior normal.        Thought Content: Thought content normal.        Judgment: Judgment normal.     BP (!) 149/82   Pulse 97   Resp 17   Ht 5' 9 (1.753 m)   Wt 182 lb (82.6 kg)   BMI 26.88 kg/m   Past Medical History:  Diagnosis Date   Acid reflux    Calculus of gallbladder with acute and chronic cholecystitis without obstruction    Essential hypertension    Hyperlipidemia due to dietary fat intake     Social History   Socioeconomic History   Marital status: Married    Spouse name: Not on file  Number of children: Not on file   Years of education: Not on file   Highest education level: Not on file  Occupational History   Not on file  Tobacco Use   Smoking status: Former   Smokeless tobacco: Current    Types: Chew  Vaping Use   Vaping status: Never Used  Substance and Sexual Activity   Alcohol use: Not Currently   Drug use: Never   Sexual activity: Not on file  Other Topics Concern   Not on file  Social History Narrative   Not on file   Social Drivers of Health   Financial Resource Strain: Not on file  Food Insecurity: No Food Insecurity (12/23/2023)   Hunger Vital Sign    Worried About Running Out of Food in the Last Year: Never true    Ran Out of Food in the Last Year: Never true  Transportation Needs: No Transportation Needs (12/23/2023)   PRAPARE - Administrator, Civil Service (Medical): No    Lack of Transportation (Non-Medical): No  Physical Activity: Not on file  Stress: Not on file  Social Connections: Unknown (12/23/2023)   Social Connection and Isolation Panel     Frequency of Communication with Friends and Family: More than three times a week    Frequency of Social Gatherings with Friends and Family: More than three times a week    Attends Religious Services: Patient declined    Active Member of Clubs or Organizations: Patient declined    Attends Banker Meetings: Patient declined    Marital Status: Patient declined  Intimate Partner Violence: Not At Risk (12/23/2023)   Humiliation, Afraid, Rape, and Kick questionnaire    Fear of Current or Ex-Partner: No    Emotionally Abused: No    Physically Abused: No    Sexually Abused: No    Past Surgical History:  Procedure Laterality Date   CARDIAC CATHETERIZATION  2010   Dr. Bosie - NORMAL CORONARIES   CHOLECYSTECTOMY N/A 11/02/2017   Procedure: LAPAROSCOPIC CHOLECYSTECTOMY;  Surgeon: [name], MD;  Location: [facility] ORS;  Service: General;  Laterality: N/A;   RIGHT COLECTOMY  2009   at [facility]    Family History  Problem Relation Age of Onset   CAD Father    Diabetes Brother    Bladder Cancer Other     Allergies  Allergen Reactions   Codeine Other (See Comments)       Latest Ref Rng & Units 02/19/2024    9:10 AM 02/01/2024    3:09 PM 01/18/2024    8:26 AM  CBC  WBC 4.0 - 10.5 K/uL 2.5  4.0  4.3   Hemoglobin 13.0 - 17.0 g/dL 88.7  8.8  8.7   Hematocrit 39.0 - 52.0 % 33.0  25.6  24.4   Platelets 150 - 400 K/uL 70  63  63       CMP     Component Value Date/Time   NA 137 02/19/2024 0910   NA 143 11/18/2011 1438   K 3.2 (L) 02/19/2024 0910   K 3.9 11/18/2011 1438   CL 101 02/19/2024 0910   CL 104 11/18/2011 1438   CO2 24 02/19/2024 0910   CO2 29 11/18/2011 1438   GLUCOSE 106 (H) 02/19/2024 0910   GLUCOSE 138 (H) 11/18/2011 1438   BUN 15 02/19/2024 0910   BUN 16 11/18/2011 1438   CREATININE 1.27 (H) 02/19/2024 0910   CREATININE 1.19 11/18/2011 1438   CALCIUM  8.5 (L)  02/19/2024 0910   CALCIUM  9.4 11/18/2011 1438   PROT 5.4 (L) 02/19/2024 0910   PROT 7.9  11/18/2011 1438   ALBUMIN 3.3 (L) 02/19/2024 0910   ALBUMIN 4.2 11/18/2011 1438   AST 30 02/19/2024 0910   ALT 28 02/19/2024 0910   ALT 39 11/18/2011 1438   ALKPHOS 100 02/19/2024 0910   ALKPHOS 172 (H) 11/18/2011 1438   BILITOT 2.5 (H) 02/19/2024 0910   GFRNONAA 59 (L) 02/19/2024 0910   GFRNONAA >60 11/18/2011 1438     No results found.     Assessment & Plan:   1. Swelling of limb (Primary) Patient returns to clinic today in follow-up for bilateral lower extremity edema.  Patient underwent bilateral lower extremity venous Doppler ultrasounds for DVT and reflux.  He does not have DVTs or reflux to either extremity.  Therefore I recommend  Recommend:  I have had a long discussion with the patient regarding swelling and why it  causes symptoms.  Patient will begin wearing graduated compression on a daily basis a prescription was given. The patient will  wear the stockings first thing in the morning and removing them in the evening. The patient is instructed specifically not to sleep in the stockings.   In addition, behavioral modification will be initiated.  This will include frequent elevation, use of over the counter pain medications and exercise such as walking.  Consideration for a lymph pump will also be made based upon the effectiveness of conservative therapy.  This would help to improve the edema control and prevent sequela such as ulcers and infections   Patient to follow-up as needed as he requests.  2. Hyperlipidemia, unspecified hyperlipidemia type Continue statin as ordered and reviewed, no changes at this time  3. Essential hypertension Continue antihypertensive medications as already ordered, these medications have been reviewed and there are no changes at this time.   Current Outpatient Medications on File Prior to Visit  Medication Sig Dispense Refill   albuterol  (VENTOLIN  HFA) 108 (90 Base) MCG/ACT inhaler Inhale 2 puffs into the lungs once as needed for  wheezing or shortness of breath (FOR GRADE 3, or 4 HYPERSENSITIVITY REACTIONS). 6.7 g 0   amitriptyline  (ELAVIL ) 10 MG tablet Take 10 mg by mouth at bedtime.     amLODipine  (NORVASC ) 10 MG tablet Take 1 tablet (10 mg total) by mouth daily. 90 tablet 1   aspirin  EC 81 MG tablet Take 81 mg by mouth daily.     atorvastatin  (LIPITOR) 40 MG tablet Take 40 mg by mouth at bedtime.     Calcium  Carb-Cholecalciferol 600-10 MG-MCG TABS Take 1 tablet by mouth 2 (two) times daily.     FLUoxetine  (PROZAC ) 20 MG capsule Take 20 mg by mouth daily.     folic acid  (FOLVITE ) 1 MG tablet Take 1 tablet (1 mg total) by mouth daily. 90 tablet 1   loratadine  (CLARITIN ) 10 MG tablet Take 10 mg by mouth daily.     mycophenolate  (CELLCEPT ) 500 MG tablet TAKE 1 TABLET BY MOUTH TWICE A DAY 180 tablet 1   omeprazole (PRILOSEC) 20 MG capsule Take 20 mg by mouth daily.     predniSONE  (DELTASONE ) 20 MG tablet Take 2 tablets (40 mg total) by mouth daily with breakfast.     thiamine (VITAMIN B-1) 100 MG tablet Take 100 mg by mouth daily.     traZODone  (DESYREL ) 100 MG tablet Take 100 mg by mouth at bedtime.     No current facility-administered medications on file  prior to visit.    There are no Patient Instructions on file for this visit. No follow-ups on file.   Gwendlyn JONELLE Shank, NP

## 2024-03-25 ENCOUNTER — Inpatient Hospital Stay: Attending: Internal Medicine

## 2024-03-25 ENCOUNTER — Inpatient Hospital Stay

## 2024-03-25 ENCOUNTER — Inpatient Hospital Stay: Admitting: Internal Medicine

## 2024-03-25 ENCOUNTER — Inpatient Hospital Stay: Admitting: Nurse Practitioner

## 2024-03-25 ENCOUNTER — Encounter: Payer: Self-pay | Admitting: Nurse Practitioner

## 2024-03-25 VITALS — BP 151/88 | HR 100 | Temp 97.9°F | Ht 69.0 in | Wt 173.0 lb

## 2024-03-25 DIAGNOSIS — F17220 Nicotine dependence, chewing tobacco, uncomplicated: Secondary | ICD-10-CM | POA: Insufficient documentation

## 2024-03-25 DIAGNOSIS — Z79624 Long term (current) use of inhibitors of nucleotide synthesis: Secondary | ICD-10-CM | POA: Insufficient documentation

## 2024-03-25 DIAGNOSIS — Z7952 Long term (current) use of systemic steroids: Secondary | ICD-10-CM | POA: Diagnosis not present

## 2024-03-25 DIAGNOSIS — D5911 Warm autoimmune hemolytic anemia: Secondary | ICD-10-CM

## 2024-03-25 LAB — CBC WITH DIFFERENTIAL (CANCER CENTER ONLY)
Abs Immature Granulocytes: 0.04 K/uL (ref 0.00–0.07)
Basophils Absolute: 0 K/uL (ref 0.0–0.1)
Basophils Relative: 0 %
Eosinophils Absolute: 0 K/uL (ref 0.0–0.5)
Eosinophils Relative: 1 %
HCT: 31.6 % — ABNORMAL LOW (ref 39.0–52.0)
Hemoglobin: 10.8 g/dL — ABNORMAL LOW (ref 13.0–17.0)
Immature Granulocytes: 2 %
Lymphocytes Relative: 53 %
Lymphs Abs: 1.4 K/uL (ref 0.7–4.0)
MCH: 31.8 pg (ref 26.0–34.0)
MCHC: 34.2 g/dL (ref 30.0–36.0)
MCV: 92.9 fL (ref 80.0–100.0)
Monocytes Absolute: 0.3 K/uL (ref 0.1–1.0)
Monocytes Relative: 12 %
Neutro Abs: 0.9 K/uL — ABNORMAL LOW (ref 1.7–7.7)
Neutrophils Relative %: 32 %
Platelet Count: 99 K/uL — ABNORMAL LOW (ref 150–400)
RBC: 3.4 MIL/uL — ABNORMAL LOW (ref 4.22–5.81)
RDW: 12.4 % (ref 11.5–15.5)
WBC Count: 2.7 K/uL — ABNORMAL LOW (ref 4.0–10.5)
nRBC: 0 % (ref 0.0–0.2)

## 2024-03-25 LAB — CMP (CANCER CENTER ONLY)
ALT: 19 U/L (ref 0–44)
AST: 29 U/L (ref 15–41)
Albumin: 3.5 g/dL (ref 3.5–5.0)
Alkaline Phosphatase: 116 U/L (ref 38–126)
Anion gap: 11 (ref 5–15)
BUN: 13 mg/dL (ref 8–23)
CO2: 27 mmol/L (ref 22–32)
Calcium: 9.3 mg/dL (ref 8.9–10.3)
Chloride: 103 mmol/L (ref 98–111)
Creatinine: 1.16 mg/dL (ref 0.61–1.24)
GFR, Estimated: >60 mL/min (ref >60)
Glucose, Bld: 103 mg/dL — ABNORMAL HIGH (ref 70–99)
Potassium: 3.6 mmol/L (ref 3.5–5.1)
Sodium: 140 mmol/L (ref 135–145)
Total Bilirubin: 1 mg/dL (ref 0.0–1.2)
Total Protein: 5.2 g/dL — ABNORMAL LOW (ref 6.5–8.1)

## 2024-03-25 LAB — RETIC PANEL
Immature Retic Fract: 11.3 % (ref 2.3–15.9)
RBC.: 3.38 MIL/uL — ABNORMAL LOW (ref 4.22–5.81)
Retic Count, Absolute: 161.9 K/uL (ref 19.0–186.0)
Retic Ct Pct: 4.8 % — ABNORMAL HIGH (ref 0.4–3.1)
Reticulocyte Hemoglobin: 33.4 pg (ref >27.9)

## 2024-03-25 LAB — SAMPLE TO BLOOD BANK

## 2024-03-25 LAB — LACTATE DEHYDROGENASE: LDH: 231 U/L (ref 105–235)

## 2024-03-25 NOTE — Progress Notes (Signed)
 [facility] CONSULT NOTE  Patient Care Team: Center, [facility] as PCP - General (General Practice) [name], MD as Consulting Physician (Oncology)  CHIEF COMPLAINTS/PURPOSE OF CONSULTATION: Hemolytic anemia  Oncology History   No history exists.    HISTORY OF PRESENTING ILLNESS: Patient ambulating- Accompanied by son  MAXIMILLION GILL 78 y.o.  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  Patient here with son.  Overall feeling well today.  He denies any fever/chills, no diarrhea, no weakness, no shortness of breath, no blood seen in urine sputum or stool.  Lab work stable.  No need for blood transfusion tomorrow.  Review of Systems  Constitutional:  Negative for chills, diaphoresis, fever, malaise/fatigue and weight loss.  HENT:  Negative for nosebleeds and sore throat.   Eyes:  Negative for double vision.  Respiratory:  Negative for cough, hemoptysis, sputum production, shortness of breath and wheezing.   Cardiovascular:  Negative for chest pain, palpitations, orthopnea and leg swelling.  Gastrointestinal:  Negative for abdominal pain, blood in stool, constipation, diarrhea, heartburn, melena, nausea and vomiting.  Genitourinary:  Negative for dysuria, frequency and urgency.  Musculoskeletal:  Positive for back pain and joint pain.  Skin: Negative.  Negative for itching and rash.  Neurological:  Negative for dizziness, tingling, focal weakness, weakness and headaches.  Endo/Heme/Allergies:  Does not bruise/bleed easily.  Psychiatric/Behavioral:  Negative for depression. The patient is not nervous/anxious and does not have insomnia.     MEDICAL HISTORY:  Past Medical History:  Diagnosis Date    Acid reflux    Calculus of gallbladder with acute and chronic cholecystitis without obstruction    Essential hypertension    Hyperlipidemia due to dietary fat intake     SURGICAL HISTORY: Past Surgical History:  Procedure Laterality Date   CARDIAC CATHETERIZATION  2010   Dr. Bosie - NORMAL CORONARIES   CHOLECYSTECTOMY N/A 11/02/2017   Procedure: LAPAROSCOPIC CHOLECYSTECTOMY;  Surgeon: [name], MD;  Location: [facility] ORS;  Service: General;  Laterality: N/A;   RIGHT COLECTOMY  2009   at [facility]    SOCIAL HISTORY: Social History   Socioeconomic History   Marital status: Married    Spouse name: Not on file   Number of children: Not on file   Years of education: Not on file   Highest education level: Not on file  Occupational History   Not on file  Tobacco Use   Smoking status: Former   Smokeless tobacco: Current    Types: Chew  Vaping Use   Vaping status: Never Used  Substance and Sexual Activity   Alcohol use: Not Currently   Drug use: Never   Sexual activity: Not on file  Other Topics Concern   Not on file  Social History Narrative   Not on file   Social Drivers of Health   Financial Resource Strain: Not on file  Food Insecurity: No Food Insecurity (12/23/2023)   Hunger Vital Sign    Worried About Running Out of Food in the Last Year: Never true    Ran Out of Food in the Last Year: Never true  Transportation Needs: No Transportation Needs (12/23/2023)   PRAPARE - Administrator, Civil Service (Medical): No    Lack of Transportation (  Non-Medical): No  Physical Activity: Not on file  Stress: Not on file  Social Connections: Unknown (12/23/2023)   Social Connection and Isolation Panel    Frequency of Communication with Friends and Family: More than three times a week    Frequency of Social Gatherings with Friends and Family: More than three times a week    Attends Religious Services: Patient declined    Database Administrator or Organizations: Patient  declined    Attends Banker Meetings: Patient declined    Marital Status: Patient declined  Intimate Partner Violence: Not At Risk (12/23/2023)   Humiliation, Afraid, Rape, and Kick questionnaire    Fear of Current or Ex-Partner: No    Emotionally Abused: No    Physically Abused: No    Sexually Abused: No    FAMILY HISTORY: Family History  Problem Relation Age of Onset   CAD Father    Diabetes Brother    Bladder Cancer Other     ALLERGIES:  is allergic to codeine.  MEDICATIONS:  Current Outpatient Medications  Medication Sig Dispense Refill   albuterol  (VENTOLIN  HFA) 108 (90 Base) MCG/ACT inhaler Inhale 2 puffs into the lungs once as needed for wheezing or shortness of breath (FOR GRADE 3, or 4 HYPERSENSITIVITY REACTIONS). 6.7 g 0   amitriptyline  (ELAVIL ) 10 MG tablet Take 10 mg by mouth at bedtime.     amLODipine  (NORVASC ) 10 MG tablet Take 1 tablet (10 mg total) by mouth daily. 90 tablet 1   aspirin  EC 81 MG tablet Take 81 mg by mouth daily.     atorvastatin  (LIPITOR) 40 MG tablet Take 40 mg by mouth at bedtime.     Calcium  Carb-Cholecalciferol 600-10 MG-MCG TABS Take 1 tablet by mouth 2 (two) times daily.     FLUoxetine  (PROZAC ) 20 MG capsule Take 20 mg by mouth daily.     folic acid  (FOLVITE ) 1 MG tablet Take 1 tablet (1 mg total) by mouth daily. 90 tablet 1   loratadine  (CLARITIN ) 10 MG tablet Take 10 mg by mouth daily.     mycophenolate  (CELLCEPT ) 500 MG tablet TAKE 1 TABLET BY MOUTH TWICE A DAY 180 tablet 1   omeprazole (PRILOSEC) 20 MG capsule Take 20 mg by mouth daily.     predniSONE  (DELTASONE ) 20 MG tablet Take 2 tablets (40 mg total) by mouth daily with breakfast.     thiamine (VITAMIN B-1) 100 MG tablet Take 100 mg by mouth daily.     traZODone  (DESYREL ) 100 MG tablet Take 100 mg by mouth at bedtime.     No current facility-administered medications for this visit.    PHYSICAL EXAMINATION:   Vitals:   03/25/24 0842 03/25/24 0852  BP: (!) 160/84  (!) 151/88  Pulse: 100   Temp: 97.9 F (36.6 C)   SpO2: 100%    Filed Weights   03/25/24 0842  Weight: 173 lb (78.5 kg)    Physical Exam Vitals and nursing note reviewed.  HENT:     Head: Normocephalic and atraumatic.     Mouth/Throat:     Pharynx: Oropharynx is clear.  Eyes:     Extraocular Movements: Extraocular movements intact.     Pupils: Pupils are equal, round, and reactive to light.  Cardiovascular:     Rate and Rhythm: Normal rate and regular rhythm.  Pulmonary:     Comments: Decreased breath sounds bilaterally.  Abdominal:     Palpations: Abdomen is soft.  Musculoskeletal:  General: Normal range of motion.     Cervical back: Normal range of motion.  Skin:    General: Skin is warm.  Neurological:     General: No focal deficit present.     Mental Status: He is alert and oriented to person, place, and time.  Psychiatric:        Behavior: Behavior normal.        Judgment: Judgment normal.     LABORATORY DATA:  I have reviewed the data as listed Lab Results  Component Value Date   WBC 2.7 (L) 03/25/2024   HGB 10.8 (L) 03/25/2024   HCT 31.6 (L) 03/25/2024   MCV 92.9 03/25/2024   PLT 99 (L) 03/25/2024   Lab Results  Component Value Date   NEUTROABS 0.9 (L) 03/25/2024    Recent Labs    12/12/23 1656 12/13/23 0635 12/26/23 0800 12/29/23 1309 02/01/24 1509 02/19/24 0910 03/25/24 0825  NA  --    < >  --    < > 137 137 140  K  --    < >  --    < > 3.5 3.2* 3.6  CL  --    < >  --    < > 103 101 103  CO2  --    < >  --    < > 24 24 27   GLUCOSE  --    < >  --    < > 109* 106* 103*  BUN  --    < >  --    < > 29* 15 13  CREATININE  --    < >  --    < > 1.13 1.27* 1.16  CALCIUM   --    < >  --    < > 9.1 8.5* 9.3  GFRNONAA  --    < >  --    < > >60 59* >60  PROT  --    < >  --    < > 5.2* 5.4* 5.2*  ALBUMIN  --    < >  --    < > 3.2* 3.3* 3.5  AST  --    < >  --    < > 41 30 29  ALT  --    < >  --    < > 40 28 19  ALKPHOS  --    < >  --    < >  70 100 116  BILITOT 4.1*   < > 1.4*   < > 3.1* 2.5* 1.0  BILIDIR 0.9*  --  0.5*  --   --   --   --   IBILI 3.2*  --  0.9  --   --   --   --    < > = values in this interval not displayed.    RADIOGRAPHIC STUDIES: I have personally reviewed the radiological images as listed and agreed with the findings in the report. VAS US  LOWER EXTREMITY VENOUS REFLUX Result Date: 03/11/2024  Lower Venous Reflux Study Patient Name:  ZAREK RELPH  Date of Exam:   03/08/2024 Medical Rec #: [id number]       Accession #:    [id number] Date of Birth: 1946-03-13        Patient Gender: M Patient Age:   69 years Exam Location:  [facility] Procedure:      VAS US  LOWER EXTREMITY VENOUS REFLUX Referring Phys: SELINDA GU --------------------------------------------------------------------------------  Indications:  Swelling.  Performing Technologist: Leafy Gibes RVS  Examination Guidelines: A complete evaluation includes B-mode imaging, spectral Doppler, color Doppler, and power Doppler as needed of all accessible portions of each vessel. Bilateral testing is considered an integral part of a complete examination. Limited examinations for reoccurring indications may be performed as noted. The reflux portion of the exam is performed with the patient in reverse Trendelenburg. Significant venous reflux is defined as >500 ms in the superficial venous system, and >1 second in the deep venous system.  Venous Reflux Times +--------------+---------+------+-----------+------------+--------+ RIGHT         Reflux NoRefluxReflux TimeDiameter cmsComments                         Yes                                  +--------------+---------+------+-----------+------------+--------+ CFV           no                                             +--------------+---------+------+-----------+------------+--------+ FV prox       no                                              +--------------+---------+------+-----------+------------+--------+ FV mid        no                                             +--------------+---------+------+-----------+------------+--------+ FV dist       no                                             +--------------+---------+------+-----------+------------+--------+ Popliteal     no                                             +--------------+---------+------+-----------+------------+--------+ GSV at SFJ    no                            .53              +--------------+---------+------+-----------+------------+--------+ GSV prox thighno                            .42              +--------------+---------+------+-----------+------------+--------+ GSV mid thigh no                            .25              +--------------+---------+------+-----------+------------+--------+ GSV dist thighno                            .  25              +--------------+---------+------+-----------+------------+--------+ GSV at knee   no                            .24              +--------------+---------+------+-----------+------------+--------+ GSV prox calf no                            .25              +--------------+---------+------+-----------+------------+--------+ SSV at [facility]    no                            .17              +--------------+---------+------+-----------+------------+--------+  +--------------+---------+------+-----------+------------+--------+ LEFT          Reflux NoRefluxReflux TimeDiameter cmsComments                         Yes                                  +--------------+---------+------+-----------+------------+--------+ CFV           no                                             +--------------+---------+------+-----------+------------+--------+ FV prox       no                                              +--------------+---------+------+-----------+------------+--------+ FV mid        no                                             +--------------+---------+------+-----------+------------+--------+ FV dist       no                                             +--------------+---------+------+-----------+------------+--------+ Popliteal     no                                             +--------------+---------+------+-----------+------------+--------+ GSV at [facility]    no                            .43              +--------------+---------+------+-----------+------------+--------+ GSV prox thighno                            .66              +--------------+---------+------+-----------+------------+--------+ GSV  mid thigh no                            .36              +--------------+---------+------+-----------+------------+--------+ GSV dist thighno                            .38              +--------------+---------+------+-----------+------------+--------+ GSV at knee   no                            .31              +--------------+---------+------+-----------+------------+--------+ GSV prox calf no                            .37              +--------------+---------+------+-----------+------------+--------+ SSV at [facility]    no                            .24              +--------------+---------+------+-----------+------------+--------+   Summary: Bilateral: - No evidence of deep vein thrombosis seen in the lower extremities, bilaterally, from the common femoral through the popliteal veins. - No evidence of superficial venous thrombosis in the lower extremities, bilaterally. - No evidence of deep venous insufficiency seen bilaterally in the lower extremity. - No evidence of superficial venous reflux seen in the greater saphenous veins bilaterally. - No evidence of superficial venous reflux seen in the short saphenous veins bilaterally.  *See  table(s) above for measurements and observations. Electronically signed by Selinda Gu MD on 03/11/2024 at 10:10:25 AM.    Final     Assessment and plan:  Warm autoimmune hemolytic anemia (HCC) # AUG 2025- [[facility]] -Predominant hemolytic anemia DAT positive warm mild thrombocytopenia--s/p 1u pRBC for Hgb 6.1-patient then --received IV solumedrol for 3 days and discharged on prednisone  80 mg daily. CT scan abdomen pelvis no evidence of any-malignancy noted.  CT chest unremarkable.    # Currently on tapered down dose of prednisone  10 mg daily, instructed to continue this dose. hemoglobin stable at 10.8 today . Continue mycophenolate  mofetil (MMF) starting at a dose of 500 mg every 12 hours.  Consider increasing the dose to 1000 mg twice a day if hemolysis labs trending down. .Continue folic acid .  No need for blood tomorrow.  No changes to medication this visit.  Will see him back in 4 weeks and repeat labs to monitor.      # Bilateral leg swelling/weight gain sec to streoids-[also awaiting Dr.dew appts] now tapered down to 10mg  daily prednisone  and tolerating well, swelling improved   # Confusion question steroid psychosis-recommend tapering the dose of steroids and monitor closely. resolved   # DISPOSITION: # No blood tomorrow; cancel appts.  # Follow up in 5 weeks- MD- labs-cbc/LDH;retic count- CMP;HOLD tube- D-2 Possible 1unit blood-- LP   Above plan of care was discussed with patient/family in detail.  My contact information was given to the patient/family.       Morna Husband AGNP-C 03/25/2024 9:36 AM

## 2024-03-26 ENCOUNTER — Inpatient Hospital Stay

## 2024-04-19 ENCOUNTER — Other Ambulatory Visit: Payer: Self-pay

## 2024-04-19 DIAGNOSIS — D5911 Warm autoimmune hemolytic anemia: Secondary | ICD-10-CM

## 2024-04-22 ENCOUNTER — Inpatient Hospital Stay (HOSPITAL_BASED_OUTPATIENT_CLINIC_OR_DEPARTMENT_OTHER): Admitting: Internal Medicine

## 2024-04-22 ENCOUNTER — Inpatient Hospital Stay: Attending: Internal Medicine

## 2024-04-22 ENCOUNTER — Encounter: Payer: Self-pay | Admitting: Internal Medicine

## 2024-04-22 VITALS — BP 159/85 | HR 88 | Temp 98.2°F | Resp 16 | Ht 69.0 in | Wt 167.7 lb

## 2024-04-22 DIAGNOSIS — R410 Disorientation, unspecified: Secondary | ICD-10-CM | POA: Insufficient documentation

## 2024-04-22 DIAGNOSIS — R5383 Other fatigue: Secondary | ICD-10-CM | POA: Diagnosis not present

## 2024-04-22 DIAGNOSIS — F17220 Nicotine dependence, chewing tobacco, uncomplicated: Secondary | ICD-10-CM | POA: Diagnosis not present

## 2024-04-22 DIAGNOSIS — Z7952 Long term (current) use of systemic steroids: Secondary | ICD-10-CM | POA: Diagnosis not present

## 2024-04-22 DIAGNOSIS — M7989 Other specified soft tissue disorders: Secondary | ICD-10-CM | POA: Diagnosis not present

## 2024-04-22 DIAGNOSIS — D5911 Warm autoimmune hemolytic anemia: Secondary | ICD-10-CM | POA: Insufficient documentation

## 2024-04-22 DIAGNOSIS — R634 Abnormal weight loss: Secondary | ICD-10-CM | POA: Insufficient documentation

## 2024-04-22 LAB — CBC WITH DIFFERENTIAL (CANCER CENTER ONLY)
Abs Immature Granulocytes: 0.09 K/uL — ABNORMAL HIGH (ref 0.00–0.07)
Basophils Absolute: 0 K/uL (ref 0.0–0.1)
Basophils Relative: 0 %
Eosinophils Absolute: 0.1 K/uL (ref 0.0–0.5)
Eosinophils Relative: 2 %
HCT: 32.8 % — ABNORMAL LOW (ref 39.0–52.0)
Hemoglobin: 11 g/dL — ABNORMAL LOW (ref 13.0–17.0)
Immature Granulocytes: 2 %
Lymphocytes Relative: 30 %
Lymphs Abs: 1.4 K/uL (ref 0.7–4.0)
MCH: 31.2 pg (ref 26.0–34.0)
MCHC: 33.5 g/dL (ref 30.0–36.0)
MCV: 92.9 fL (ref 80.0–100.0)
Monocytes Absolute: 0.4 K/uL (ref 0.1–1.0)
Monocytes Relative: 8 %
Neutro Abs: 2.6 K/uL (ref 1.7–7.7)
Neutrophils Relative %: 58 %
Platelet Count: 91 K/uL — ABNORMAL LOW (ref 150–400)
RBC: 3.53 MIL/uL — ABNORMAL LOW (ref 4.22–5.81)
RDW: 13 % (ref 11.5–15.5)
WBC Count: 4.5 K/uL (ref 4.0–10.5)
nRBC: 0 % (ref 0.0–0.2)

## 2024-04-22 LAB — CMP (CANCER CENTER ONLY)
ALT: 36 U/L (ref 0–44)
AST: 42 U/L — ABNORMAL HIGH (ref 15–41)
Albumin: 3.4 g/dL — ABNORMAL LOW (ref 3.5–5.0)
Alkaline Phosphatase: 169 U/L — ABNORMAL HIGH (ref 38–126)
Anion gap: 8 (ref 5–15)
BUN: 9 mg/dL (ref 8–23)
CO2: 30 mmol/L (ref 22–32)
Calcium: 9.1 mg/dL (ref 8.9–10.3)
Chloride: 100 mmol/L (ref 98–111)
Creatinine: 1.08 mg/dL (ref 0.61–1.24)
GFR, Estimated: >60 mL/min
Glucose, Bld: 128 mg/dL — ABNORMAL HIGH (ref 70–99)
Potassium: 3.4 mmol/L — ABNORMAL LOW (ref 3.5–5.1)
Sodium: 137 mmol/L (ref 135–145)
Total Bilirubin: 0.6 mg/dL (ref 0.0–1.2)
Total Protein: 5.2 g/dL — ABNORMAL LOW (ref 6.5–8.1)

## 2024-04-22 LAB — RETICULOCYTES
Immature Retic Fract: 9.8 % (ref 2.3–15.9)
RBC.: 3.51 MIL/uL — ABNORMAL LOW (ref 4.22–5.81)
Retic Count, Absolute: 103.2 K/uL (ref 19.0–186.0)
Retic Ct Pct: 2.9 % (ref 0.4–3.1)

## 2024-04-22 LAB — LACTATE DEHYDROGENASE: LDH: 208 U/L (ref 105–235)

## 2024-04-22 LAB — SAMPLE TO BLOOD BANK

## 2024-04-22 NOTE — Progress Notes (Signed)
 No concerns today. Accompanied today by his daughter in law, [place].

## 2024-04-22 NOTE — Progress Notes (Signed)
 [facility] CONSULT NOTE  Patient Care Team: Center, [facility] as PCP - General (General Practice) [name], MD as Consulting Physician (Oncology)  CHIEF COMPLAINTS/PURPOSE OF CONSULTATION: Hemolytic anemia  Oncology History   No problem history exists.    HISTORY OF PRESENTING ILLNESS: Patient ambulating- in wheel chair. Accompanied by son  Brian Montes 79 y.o.  male pleasant patient with   with hx of hyponatremia, NASH-recently admitted to the hospital for acute onset of autoimmune hemolytic anemia-on prednisone - mycophenolate  and rituximab  weekly x 4 [finished oct 3rd, 2025 ]is here for follow-up.  Discussed the use of AI scribe software for clinical note transcription with the patient, who gave verbal consent to proceed.  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  He was diagnosed with warm autoimmune hemolytic anemia, with a nadir hemoglobin of 6 g/dL in August during an acute illness. Since initiation of rituximab  and prednisone , his blood counts have gradually improved. At this visit, his hemoglobin is approximately 11 g/dL, which remains below the normal male reference range but demonstrates continued improvement. He is currently taking prednisone  (half tablet daily), mycophenolate  mofetil twice daily, and folic acid  supplementation. He is nearing the end of his current prednisone  and mycophenolate  supply, with a refill available for the latter.  He previously experienced significant lower extremity edema, which has improved with supportive measures including leg elevation. He had confusion and frustration when starting steroids, but these symptoms have resolved. He denies new or worsening symptoms and overall feels well.  He has not received a flu vaccination this year, stating he does not take  vaccinations. Medication management is overseen by his daughter-in-law, and prescriptions are filled at the [facility]. He inquired about the need for further transfusions.     Review of Systems  Constitutional:  Positive for malaise/fatigue. Negative for chills, diaphoresis, fever and weight loss.  HENT:  Negative for nosebleeds and sore throat.   Eyes:  Negative for double vision.  Respiratory:  Negative for cough, hemoptysis, sputum production, shortness of breath and wheezing.   Cardiovascular:  Negative for chest pain, palpitations, orthopnea and leg swelling.  Gastrointestinal:  Negative for abdominal pain, blood in stool, constipation, diarrhea, heartburn, melena, nausea and vomiting.  Genitourinary:  Negative for dysuria, frequency and urgency.  Musculoskeletal:  Positive for back pain and joint pain.  Skin: Negative.  Negative for itching and rash.  Neurological:  Negative for dizziness, tingling, focal weakness, weakness and headaches.  Endo/Heme/Allergies:  Does not bruise/bleed easily.  Psychiatric/Behavioral:  Negative for depression. The patient is not nervous/anxious and does not have insomnia.     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 Bosie - NORMAL CORONARIES   CHOLECYSTECTOMY N/A 11/02/2017   Procedure: LAPAROSCOPIC CHOLECYSTECTOMY;  Surgeon: [name], MD;  Location: [facility] ORS;  Service: General;  Laterality: N/A;   RIGHT COLECTOMY  2009   at [facility]    SOCIAL HISTORY: Social History   Socioeconomic History   Marital status: Married    Spouse name: Not on file  Number of children: Not on file   Years of education: Not on file   Highest education level: Not on  file  Occupational History   Not on file  Tobacco Use   Smoking status: Former   Smokeless tobacco: Current    Types: Chew  Vaping Use   Vaping status: Never Used  Substance and Sexual Activity   Alcohol use: Not Currently   Drug use: Never   Sexual activity: Not on file  Other Topics Concern   Not on file  Social History Narrative   Not on file   Social Drivers of Health   Tobacco Use: High Risk (04/22/2024)   Patient History    Smoking Tobacco Use: Former    Smokeless Tobacco Use: Current    Passive Exposure: Not on Actuary Strain: Not on file  Food Insecurity: No Food Insecurity (12/23/2023)   Epic    Worried About Radiation Protection Practitioner of Food in the Last Year: Never true    Ran Out of Food in the Last Year: Never true  Transportation Needs: No Transportation Needs (12/23/2023)   Epic    Lack of Transportation (Medical): No    Lack of Transportation (Non-Medical): No  Physical Activity: Not on file  Stress: Not on file  Social Connections: Unknown (12/23/2023)   Social Connection and Isolation Panel    Frequency of Communication with Friends and Family: More than three times a week    Frequency of Social Gatherings with Friends and Family: More than three times a week    Attends Religious Services: Patient declined    Database Administrator or Organizations: Patient declined    Attends Banker Meetings: Patient declined    Marital Status: Patient declined  Intimate Partner Violence: Not At Risk (12/23/2023)   Epic    Fear of Current or Ex-Partner: No    Emotionally Abused: No    Physically Abused: No    Sexually Abused: No  Depression (PHQ2-9): Low Risk (04/22/2024)   Depression (PHQ2-9)    PHQ-2 Score: 1  Alcohol Screen: Not on file  Housing: Low Risk (12/23/2023)   Epic    Unable to Pay for Housing in the Last Year: No    Number of Times Moved in the Last Year: 1    Homeless in the Last Year: No  Utilities: Not At Risk (12/23/2023)   Epic     Threatened with loss of utilities: No  Health Literacy: Not on file    FAMILY HISTORY: Family History  Problem Relation Age of Onset   CAD Father    Diabetes Brother    Bladder Cancer Other     ALLERGIES:  is allergic to codeine.  MEDICATIONS:  Current Outpatient Medications  Medication Sig Dispense Refill   albuterol  (VENTOLIN  HFA) 108 (90 Base) MCG/ACT inhaler Inhale 2 puffs into the lungs once as needed for wheezing or shortness of breath (FOR GRADE 3, or 4 HYPERSENSITIVITY REACTIONS). 6.7 g 0   amitriptyline  (ELAVIL ) 10 MG tablet Take 10 mg by mouth at bedtime.     amLODipine  (NORVASC ) 10 MG tablet Take 1 tablet (10 mg total) by mouth daily. 90 tablet 1   aspirin  EC 81 MG tablet Take 81 mg by mouth daily.     atorvastatin  (LIPITOR) 40 MG tablet Take 40 mg by mouth at bedtime.     Calcium  Carb-Cholecalciferol 600-10 MG-MCG TABS Take 1 tablet by mouth 2 (two) times daily.     FLUoxetine  (PROZAC )  20 MG capsule Take 20 mg by mouth daily.     folic acid  (FOLVITE ) 1 MG tablet Take 1 tablet (1 mg total) by mouth daily. 90 tablet 1   loratadine  (CLARITIN ) 10 MG tablet Take 10 mg by mouth daily.     mycophenolate  (CELLCEPT ) 500 MG tablet TAKE 1 TABLET BY MOUTH TWICE A DAY 180 tablet 1   omeprazole (PRILOSEC) 20 MG capsule Take 20 mg by mouth daily.     predniSONE  (DELTASONE ) 20 MG tablet Take 2 tablets (40 mg total) by mouth daily with breakfast.     thiamine (VITAMIN B-1) 100 MG tablet Take 100 mg by mouth daily.     traZODone  (DESYREL ) 100 MG tablet Take 100 mg by mouth at bedtime.     No current facility-administered medications for this visit.    PHYSICAL EXAMINATION:   Vitals:   04/22/24 0907 04/22/24 0918  BP: (!) 160/86 (!) 159/85  Pulse: 88   Resp: 16   Temp: 98.2 F (36.8 C)   SpO2: 100%    Filed Weights   04/22/24 0907  Weight: 167 lb 11.2 oz (76.1 kg)    Physical Exam Vitals and nursing note reviewed.  HENT:     Head: Normocephalic and atraumatic.      Mouth/Throat:     Pharynx: Oropharynx is clear.  Eyes:     Extraocular Movements: Extraocular movements intact.     Pupils: Pupils are equal, round, and reactive to light.  Cardiovascular:     Rate and Rhythm: Normal rate and regular rhythm.  Pulmonary:     Comments: Decreased breath sounds bilaterally.  Abdominal:     Palpations: Abdomen is soft.  Musculoskeletal:        General: Normal range of motion.     Cervical back: Normal range of motion.  Skin:    General: Skin is warm.  Neurological:     General: No focal deficit present.     Mental Status: He is alert and oriented to person, place, and time.  Psychiatric:        Behavior: Behavior normal.        Judgment: Judgment normal.     LABORATORY DATA:  I have reviewed the data as listed Lab Results  Component Value Date   WBC 4.5 04/22/2024   HGB 11.0 (L) 04/22/2024   HCT 32.8 (L) 04/22/2024   MCV 92.9 04/22/2024   PLT 91 (L) 04/22/2024   Recent Labs    12/12/23 1656 12/13/23 0635 12/26/23 0800 12/29/23 1309 02/19/24 0910 03/25/24 0825 04/22/24 0848  NA  --    < >  --    < > 137 140 137  K  --    < >  --    < > 3.2* 3.6 3.4*  CL  --    < >  --    < > 101 103 100  CO2  --    < >  --    < > 24 27 30   GLUCOSE  --    < >  --    < > 106* 103* 128*  BUN  --    < >  --    < > 15 13 9   CREATININE  --    < >  --    < > 1.27* 1.16 1.08  CALCIUM   --    < >  --    < > 8.5* 9.3 9.1  GFRNONAA  --    < >  --    < >  59* >60 >60  PROT  --    < >  --    < > 5.4* 5.2* 5.2*  ALBUMIN  --    < >  --    < > 3.3* 3.5 3.4*  AST  --    < >  --    < > 30 29 42*  ALT  --    < >  --    < > 28 19 36  ALKPHOS  --    < >  --    < > 100 116 169*  BILITOT 4.1*   < > 1.4*   < > 2.5* 1.0 0.6  BILIDIR 0.9*  --  0.5*  --   --   --   --   IBILI 3.2*  --  0.9  --   --   --   --    < > = values in this interval not displayed.    RADIOGRAPHIC STUDIES: I have personally reviewed the radiological images as listed and agreed with the findings  in the report. No results found.    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 CT scan abdomen pelvis no evidence of any-malignancy noted.  CT chest unremarkable.  S/p Rituximab -   # Currently on prednisone  20 mg a day,  hemoglobin overall improved at 11 today. Proceed with prednisone  taper- 1/2 pills every other day x 2 weeks; and then STOP. Continue mycophenolate  mofetil (MMF) starting at a dose of 500 mg every 12 hours. Continue folic acid .   # Bilateral leg swelling/weight gain sec to streoids-[also awaiting Dr.dew appts] improved- stable.   # Confusion question steroid psychosis-recommend tapering the dose of steroids and monitor closely-  stable. \  # Vaccination- declines Vaccination.   # DISPOSITION: # No blood tomorrow; cancel appts.  # Follow up in 4  weeks- MD- labs-cbc/LDH;retic count- CMP;HOLD tube- D-2 Possible 1unit blood-- Dr.B    Above plan of care was discussed with patient/family in detail.  My contact information was given to the patient/family.       Daniyah Fohl R Pauleen Goleman, MD 04/22/2024 10:30 AM

## 2024-04-22 NOTE — Assessment & Plan Note (Addendum)
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 CT scan abdomen pelvis no evidence of any-malignancy noted.  CT chest unremarkable.  S/p Rituximab -   # Currently on prednisone  20 mg a day,  hemoglobin overall improved at 11 today. Proceed with prednisone  taper- 1/2 pills every other day x 2 weeks; and then STOP. Continue mycophenolate  mofetil (MMF) starting at a dose of 500 mg every 12 hours. Continue folic acid .   # Bilateral leg swelling/weight gain sec to streoids-[also awaiting Dr.dew appts] improved- stable.   # Confusion question steroid psychosis-recommend tapering the dose of steroids and monitor closely-  stable. \  # Vaccination- declines Vaccination.   # DISPOSITION: # No blood tomorrow; cancel appts.  # Follow up in 4  weeks- MD- labs-cbc/LDH;retic count- CMP;HOLD tube- D-2 Possible 1unit blood-- Dr.B

## 2024-04-23 ENCOUNTER — Inpatient Hospital Stay

## 2024-05-21 ENCOUNTER — Inpatient Hospital Stay: Admitting: Internal Medicine

## 2024-05-21 ENCOUNTER — Encounter: Payer: Self-pay | Admitting: Internal Medicine

## 2024-05-21 ENCOUNTER — Inpatient Hospital Stay: Attending: Internal Medicine

## 2024-05-21 VITALS — BP 118/90 | HR 116 | Temp 98.5°F | Resp 16 | Ht 69.0 in | Wt 164.0 lb

## 2024-05-21 DIAGNOSIS — D5911 Warm autoimmune hemolytic anemia: Secondary | ICD-10-CM

## 2024-05-21 LAB — CMP (CANCER CENTER ONLY)
ALT: 9 U/L (ref 0–44)
AST: 25 U/L (ref 15–41)
Albumin: 3.6 g/dL (ref 3.5–5.0)
Alkaline Phosphatase: 134 U/L — ABNORMAL HIGH (ref 38–126)
Anion gap: 11 (ref 5–15)
BUN: 11 mg/dL (ref 8–23)
CO2: 27 mmol/L (ref 22–32)
Calcium: 10.2 mg/dL (ref 8.9–10.3)
Chloride: 103 mmol/L (ref 98–111)
Creatinine: 1.22 mg/dL (ref 0.61–1.24)
GFR, Estimated: >60 mL/min
Glucose, Bld: 107 mg/dL — ABNORMAL HIGH (ref 70–99)
Potassium: 3.3 mmol/L — ABNORMAL LOW (ref 3.5–5.1)
Sodium: 141 mmol/L (ref 135–145)
Total Bilirubin: 0.6 mg/dL (ref 0.0–1.2)
Total Protein: 5.6 g/dL — ABNORMAL LOW (ref 6.5–8.1)

## 2024-05-21 LAB — CBC WITH DIFFERENTIAL (CANCER CENTER ONLY)
Abs Immature Granulocytes: 0.18 10*3/uL — ABNORMAL HIGH (ref 0.00–0.07)
Basophils Absolute: 0 10*3/uL (ref 0.0–0.1)
Basophils Relative: 1 %
Eosinophils Absolute: 0.1 10*3/uL (ref 0.0–0.5)
Eosinophils Relative: 2 %
HCT: 35.7 % — ABNORMAL LOW (ref 39.0–52.0)
Hemoglobin: 11.7 g/dL — ABNORMAL LOW (ref 13.0–17.0)
Immature Granulocytes: 4 %
Lymphocytes Relative: 42 %
Lymphs Abs: 1.7 10*3/uL (ref 0.7–4.0)
MCH: 30.1 pg (ref 26.0–34.0)
MCHC: 32.8 g/dL (ref 30.0–36.0)
MCV: 91.8 fL (ref 80.0–100.0)
Monocytes Absolute: 0.4 10*3/uL (ref 0.1–1.0)
Monocytes Relative: 8 %
Neutro Abs: 1.8 10*3/uL (ref 1.7–7.7)
Neutrophils Relative %: 43 %
Platelet Count: 131 10*3/uL — ABNORMAL LOW (ref 150–400)
RBC: 3.89 MIL/uL — ABNORMAL LOW (ref 4.22–5.81)
RDW: 13.3 % (ref 11.5–15.5)
WBC Count: 4.2 10*3/uL (ref 4.0–10.5)
nRBC: 0 % (ref 0.0–0.2)

## 2024-05-21 LAB — SAMPLE TO BLOOD BANK

## 2024-05-21 LAB — RETIC PANEL
Immature Retic Fract: 6.8 % (ref 2.3–15.9)
RBC.: 3.86 MIL/uL — ABNORMAL LOW (ref 4.22–5.81)
Retic Count, Absolute: 67.6 10*3/uL (ref 19.0–186.0)
Retic Ct Pct: 1.8 % (ref 0.4–3.1)
Reticulocyte Hemoglobin: 32.9 pg

## 2024-05-21 LAB — RETICULOCYTES
Immature Retic Fract: 7.6 % (ref 2.3–15.9)
RBC.: 3.92 MIL/uL — ABNORMAL LOW (ref 4.22–5.81)
Retic Count, Absolute: 75.3 10*3/uL (ref 19.0–186.0)
Retic Ct Pct: 1.9 % (ref 0.4–3.1)

## 2024-05-21 LAB — LACTATE DEHYDROGENASE: LDH: 233 U/L (ref 105–235)

## 2024-05-21 NOTE — Progress Notes (Signed)
 Pt fell in our parking lot today, he states no injuries.  [facility] gave them a rx for iron but Andy(son) thought you had told them he didn't need iron. He has not been taking it.

## 2024-05-22 ENCOUNTER — Inpatient Hospital Stay

## 2024-07-03 ENCOUNTER — Inpatient Hospital Stay: Admitting: Internal Medicine

## 2024-07-03 ENCOUNTER — Inpatient Hospital Stay

## 2024-07-04 ENCOUNTER — Inpatient Hospital Stay
